# Patient Record
Sex: Female | Born: 1955
Health system: Southern US, Community
[De-identification: ages and names within clinical notes are randomized; demographics above are authoritative.]

## PROBLEM LIST (undated history)

## (undated) DIAGNOSIS — K589 Irritable bowel syndrome without diarrhea: Secondary | ICD-10-CM

## (undated) DIAGNOSIS — M1990 Unspecified osteoarthritis, unspecified site: Secondary | ICD-10-CM

## (undated) DIAGNOSIS — G4733 Obstructive sleep apnea (adult) (pediatric): Secondary | ICD-10-CM

## (undated) DIAGNOSIS — Z973 Presence of spectacles and contact lenses: Secondary | ICD-10-CM

## (undated) DIAGNOSIS — Z860100 Personal history of colon polyps, unspecified: Secondary | ICD-10-CM

## (undated) DIAGNOSIS — E119 Type 2 diabetes mellitus without complications: Secondary | ICD-10-CM

## (undated) DIAGNOSIS — R011 Cardiac murmur, unspecified: Secondary | ICD-10-CM

## (undated) DIAGNOSIS — C541 Malignant neoplasm of endometrium: Secondary | ICD-10-CM

## (undated) DIAGNOSIS — J45990 Exercise induced bronchospasm: Secondary | ICD-10-CM

## (undated) DIAGNOSIS — Z8679 Personal history of other diseases of the circulatory system: Secondary | ICD-10-CM

## (undated) HISTORY — DX: Irritable bowel syndrome, unspecified: K58.9

## (undated) HISTORY — PX: PLANTAR FASCIA RELEASE: SHX2239

## (undated) HISTORY — PX: OTHER SURGICAL HISTORY: SHX169

## (undated) HISTORY — DX: Personal history of colonic polyps: Z86.010

## (undated) HISTORY — DX: Personal history of colon polyps, unspecified: Z86.0100

---

## 1994-04-19 HISTORY — PX: LAPAROSCOPIC CHOLECYSTECTOMY: SUR755

## 1998-06-24 ENCOUNTER — Other Ambulatory Visit: Admission: RE | Admit: 1998-06-24 | Discharge: 1998-06-24 | Payer: Self-pay | Admitting: Internal Medicine

## 1998-11-24 ENCOUNTER — Other Ambulatory Visit: Admission: RE | Admit: 1998-11-24 | Discharge: 1998-11-24 | Payer: Self-pay | Admitting: Obstetrics & Gynecology

## 2000-04-06 ENCOUNTER — Other Ambulatory Visit: Admission: RE | Admit: 2000-04-06 | Discharge: 2000-04-06 | Payer: Self-pay | Admitting: Obstetrics & Gynecology

## 2000-11-14 ENCOUNTER — Encounter (INDEPENDENT_AMBULATORY_CARE_PROVIDER_SITE_OTHER): Payer: Self-pay | Admitting: Specialist

## 2000-11-14 ENCOUNTER — Other Ambulatory Visit: Admission: RE | Admit: 2000-11-14 | Discharge: 2000-11-14 | Payer: Self-pay | Admitting: Obstetrics & Gynecology

## 2002-05-01 ENCOUNTER — Emergency Department (HOSPITAL_COMMUNITY): Admission: EM | Admit: 2002-05-01 | Discharge: 2002-05-02 | Payer: Self-pay | Admitting: Emergency Medicine

## 2002-05-01 ENCOUNTER — Encounter: Payer: Self-pay | Admitting: Emergency Medicine

## 2002-05-25 ENCOUNTER — Other Ambulatory Visit: Admission: RE | Admit: 2002-05-25 | Discharge: 2002-05-25 | Payer: Self-pay | Admitting: Obstetrics & Gynecology

## 2002-08-06 ENCOUNTER — Emergency Department (HOSPITAL_COMMUNITY): Admission: EM | Admit: 2002-08-06 | Discharge: 2002-08-06 | Payer: Self-pay

## 2002-11-25 ENCOUNTER — Emergency Department (HOSPITAL_COMMUNITY): Admission: EM | Admit: 2002-11-25 | Discharge: 2002-11-25 | Payer: Self-pay | Admitting: Emergency Medicine

## 2003-04-29 ENCOUNTER — Other Ambulatory Visit: Admission: RE | Admit: 2003-04-29 | Discharge: 2003-04-29 | Payer: Self-pay | Admitting: Obstetrics & Gynecology

## 2004-02-19 ENCOUNTER — Ambulatory Visit: Payer: Self-pay | Admitting: Internal Medicine

## 2004-04-08 ENCOUNTER — Ambulatory Visit: Payer: Self-pay | Admitting: Internal Medicine

## 2004-06-17 ENCOUNTER — Other Ambulatory Visit: Admission: RE | Admit: 2004-06-17 | Discharge: 2004-06-17 | Payer: Self-pay | Admitting: Obstetrics & Gynecology

## 2004-10-14 ENCOUNTER — Inpatient Hospital Stay (HOSPITAL_COMMUNITY): Admission: RE | Admit: 2004-10-14 | Discharge: 2004-10-16 | Payer: Self-pay | Admitting: Psychiatry

## 2004-10-14 ENCOUNTER — Ambulatory Visit: Payer: Self-pay | Admitting: Psychiatry

## 2004-10-22 ENCOUNTER — Ambulatory Visit (HOSPITAL_COMMUNITY): Payer: Self-pay | Admitting: Psychiatry

## 2004-11-23 ENCOUNTER — Ambulatory Visit (HOSPITAL_COMMUNITY): Payer: Self-pay | Admitting: Psychiatry

## 2004-12-18 ENCOUNTER — Ambulatory Visit: Payer: Self-pay | Admitting: Internal Medicine

## 2005-01-06 ENCOUNTER — Ambulatory Visit (HOSPITAL_BASED_OUTPATIENT_CLINIC_OR_DEPARTMENT_OTHER): Admission: RE | Admit: 2005-01-06 | Discharge: 2005-01-06 | Payer: Self-pay | Admitting: Internal Medicine

## 2005-01-10 ENCOUNTER — Ambulatory Visit: Payer: Self-pay | Admitting: Internal Medicine

## 2005-01-21 ENCOUNTER — Ambulatory Visit: Payer: Self-pay | Admitting: Internal Medicine

## 2005-03-02 ENCOUNTER — Ambulatory Visit: Payer: Self-pay | Admitting: Internal Medicine

## 2005-08-19 ENCOUNTER — Ambulatory Visit: Payer: Self-pay | Admitting: Internal Medicine

## 2005-12-17 ENCOUNTER — Ambulatory Visit: Payer: Self-pay | Admitting: Internal Medicine

## 2007-07-21 ENCOUNTER — Telehealth (INDEPENDENT_AMBULATORY_CARE_PROVIDER_SITE_OTHER): Payer: Self-pay | Admitting: *Deleted

## 2007-07-21 ENCOUNTER — Emergency Department (HOSPITAL_COMMUNITY): Admission: EM | Admit: 2007-07-21 | Discharge: 2007-07-21 | Payer: Self-pay | Admitting: Emergency Medicine

## 2007-12-25 ENCOUNTER — Emergency Department (HOSPITAL_COMMUNITY): Admission: EM | Admit: 2007-12-25 | Discharge: 2007-12-25 | Payer: Self-pay | Admitting: Family Medicine

## 2007-12-29 ENCOUNTER — Encounter: Payer: Self-pay | Admitting: Internal Medicine

## 2008-01-31 ENCOUNTER — Telehealth (INDEPENDENT_AMBULATORY_CARE_PROVIDER_SITE_OTHER): Payer: Self-pay | Admitting: *Deleted

## 2008-02-01 ENCOUNTER — Ambulatory Visit: Payer: Self-pay | Admitting: Internal Medicine

## 2008-02-01 DIAGNOSIS — E669 Obesity, unspecified: Secondary | ICD-10-CM | POA: Insufficient documentation

## 2008-02-01 DIAGNOSIS — E1169 Type 2 diabetes mellitus with other specified complication: Secondary | ICD-10-CM | POA: Insufficient documentation

## 2008-02-01 DIAGNOSIS — E162 Hypoglycemia, unspecified: Secondary | ICD-10-CM | POA: Insufficient documentation

## 2008-02-01 DIAGNOSIS — R030 Elevated blood-pressure reading, without diagnosis of hypertension: Secondary | ICD-10-CM | POA: Insufficient documentation

## 2008-02-01 DIAGNOSIS — Z860100 Personal history of colon polyps, unspecified: Secondary | ICD-10-CM | POA: Insufficient documentation

## 2008-02-01 DIAGNOSIS — I10 Essential (primary) hypertension: Secondary | ICD-10-CM | POA: Insufficient documentation

## 2008-02-01 DIAGNOSIS — E118 Type 2 diabetes mellitus with unspecified complications: Secondary | ICD-10-CM | POA: Insufficient documentation

## 2008-02-01 DIAGNOSIS — I491 Atrial premature depolarization: Secondary | ICD-10-CM | POA: Insufficient documentation

## 2008-02-05 ENCOUNTER — Encounter (INDEPENDENT_AMBULATORY_CARE_PROVIDER_SITE_OTHER): Payer: Self-pay | Admitting: *Deleted

## 2008-02-05 LAB — CONVERTED CEMR LAB: Hgb A1c MFr Bld: 5.7 % (ref 4.6–6.0)

## 2008-03-31 ENCOUNTER — Emergency Department (HOSPITAL_COMMUNITY): Admission: EM | Admit: 2008-03-31 | Discharge: 2008-03-31 | Payer: Self-pay | Admitting: Emergency Medicine

## 2009-03-26 ENCOUNTER — Encounter: Payer: Self-pay | Admitting: Internal Medicine

## 2009-04-19 DIAGNOSIS — Z87898 Personal history of other specified conditions: Secondary | ICD-10-CM

## 2009-04-19 HISTORY — DX: Personal history of other specified conditions: Z87.898

## 2009-05-02 ENCOUNTER — Encounter: Payer: Self-pay | Admitting: Internal Medicine

## 2009-06-26 ENCOUNTER — Encounter: Payer: Self-pay | Admitting: Internal Medicine

## 2009-06-27 ENCOUNTER — Ambulatory Visit: Payer: Self-pay | Admitting: Internal Medicine

## 2009-06-27 DIAGNOSIS — Z9189 Other specified personal risk factors, not elsewhere classified: Secondary | ICD-10-CM | POA: Insufficient documentation

## 2009-06-27 DIAGNOSIS — R5381 Other malaise: Secondary | ICD-10-CM | POA: Insufficient documentation

## 2009-06-27 DIAGNOSIS — R0609 Other forms of dyspnea: Secondary | ICD-10-CM | POA: Insufficient documentation

## 2009-06-27 DIAGNOSIS — R350 Frequency of micturition: Secondary | ICD-10-CM | POA: Insufficient documentation

## 2009-06-27 DIAGNOSIS — O99810 Abnormal glucose complicating pregnancy: Secondary | ICD-10-CM | POA: Insufficient documentation

## 2009-06-27 DIAGNOSIS — R0989 Other specified symptoms and signs involving the circulatory and respiratory systems: Secondary | ICD-10-CM | POA: Insufficient documentation

## 2009-06-27 DIAGNOSIS — R5383 Other fatigue: Secondary | ICD-10-CM

## 2009-06-27 DIAGNOSIS — R631 Polydipsia: Secondary | ICD-10-CM | POA: Insufficient documentation

## 2009-06-30 ENCOUNTER — Ambulatory Visit: Payer: Self-pay | Admitting: Internal Medicine

## 2009-07-01 ENCOUNTER — Encounter: Payer: Self-pay | Admitting: Internal Medicine

## 2009-07-01 ENCOUNTER — Encounter (INDEPENDENT_AMBULATORY_CARE_PROVIDER_SITE_OTHER): Payer: Self-pay | Admitting: *Deleted

## 2009-07-01 LAB — CONVERTED CEMR LAB
Creatinine,U: 68.3 mg/dL
Hgb A1c MFr Bld: 5.7 % (ref 4.6–6.5)
Microalb Creat Ratio: 5.9 mg/g (ref 0.0–30.0)
Microalb, Ur: 0.4 mg/dL (ref 0.0–1.9)

## 2009-07-09 ENCOUNTER — Ambulatory Visit: Payer: Self-pay | Admitting: Internal Medicine

## 2009-07-10 ENCOUNTER — Encounter: Payer: Self-pay | Admitting: Internal Medicine

## 2010-02-09 ENCOUNTER — Ambulatory Visit: Payer: Self-pay | Admitting: Internal Medicine

## 2010-03-06 ENCOUNTER — Telehealth: Payer: Self-pay | Admitting: Internal Medicine

## 2010-03-06 ENCOUNTER — Encounter: Payer: Self-pay | Admitting: Cardiology

## 2010-03-06 ENCOUNTER — Emergency Department (HOSPITAL_COMMUNITY): Admission: EM | Admit: 2010-03-06 | Discharge: 2010-03-06 | Payer: Self-pay | Admitting: Emergency Medicine

## 2010-03-10 ENCOUNTER — Telehealth: Payer: Self-pay | Admitting: Internal Medicine

## 2010-03-30 ENCOUNTER — Ambulatory Visit: Payer: Self-pay | Admitting: Cardiology

## 2010-03-30 ENCOUNTER — Encounter: Payer: Self-pay | Admitting: Cardiology

## 2010-03-30 DIAGNOSIS — I498 Other specified cardiac arrhythmias: Secondary | ICD-10-CM | POA: Insufficient documentation

## 2010-03-30 DIAGNOSIS — I491 Atrial premature depolarization: Secondary | ICD-10-CM | POA: Insufficient documentation

## 2010-03-30 DIAGNOSIS — R011 Cardiac murmur, unspecified: Secondary | ICD-10-CM | POA: Insufficient documentation

## 2010-04-02 ENCOUNTER — Encounter: Payer: Self-pay | Admitting: Internal Medicine

## 2010-04-07 ENCOUNTER — Ambulatory Visit: Payer: Self-pay | Admitting: Cardiology

## 2010-04-07 ENCOUNTER — Encounter: Payer: Self-pay | Admitting: Cardiology

## 2010-04-10 ENCOUNTER — Telehealth: Payer: Self-pay | Admitting: Cardiology

## 2010-04-10 LAB — CONVERTED CEMR LAB
Alkaline Phosphatase: 68 units/L (ref 39–117)
Bilirubin, Direct: 0.1 mg/dL (ref 0.0–0.3)
LDL Cholesterol: 116 mg/dL — ABNORMAL HIGH (ref 0–99)
Total Bilirubin: 0.6 mg/dL (ref 0.3–1.2)
Total CHOL/HDL Ratio: 4
Total Protein: 5.3 g/dL — ABNORMAL LOW (ref 6.0–8.3)
VLDL: 28 mg/dL (ref 0.0–40.0)

## 2010-04-16 ENCOUNTER — Telehealth (INDEPENDENT_AMBULATORY_CARE_PROVIDER_SITE_OTHER): Payer: Self-pay | Admitting: *Deleted

## 2010-04-17 ENCOUNTER — Ambulatory Visit: Payer: Self-pay

## 2010-04-17 ENCOUNTER — Ambulatory Visit (HOSPITAL_COMMUNITY)
Admission: RE | Admit: 2010-04-17 | Discharge: 2010-04-17 | Payer: Self-pay | Source: Home / Self Care | Attending: Cardiology | Admitting: Cardiology

## 2010-04-17 ENCOUNTER — Encounter: Payer: Self-pay | Admitting: Cardiology

## 2010-04-17 HISTORY — PX: TRANSTHORACIC ECHOCARDIOGRAM: SHX275

## 2010-04-19 HISTORY — PX: DOBUTAMINE STRESS ECHO: SHX5426

## 2010-04-22 ENCOUNTER — Ambulatory Visit
Admission: RE | Admit: 2010-04-22 | Discharge: 2010-04-22 | Payer: Self-pay | Source: Home / Self Care | Attending: Cardiology | Admitting: Cardiology

## 2010-05-08 ENCOUNTER — Emergency Department (HOSPITAL_COMMUNITY)
Admission: EM | Admit: 2010-05-08 | Discharge: 2010-05-08 | Payer: Self-pay | Source: Home / Self Care | Admitting: Family Medicine

## 2010-05-19 NOTE — Progress Notes (Signed)
Summary: FYI  Phone Note Call from Patient   Caller: Patient Summary of Call: Pt came in for appt today at 12:00 p.m. I told pt that Dr. Alwyn Ren was behind. She then asked me how long so I called [facility] to see how far Dr. Alwyn Ren was behind and she told me 45 mins I told pt that he was about 45 mins behind. Pt responsed " Tell Dr. Alwyn Ren I am going to find another Doctor." She then turned around and walked out the door. Initial call taken by: Lavell Islam,  March 10, 2010 12:08 PM  Follow-up for Phone Call        noted

## 2010-05-19 NOTE — Progress Notes (Signed)
Summary: Low pulse/elevated BP  Phone Note Call from Patient   Summary of Call: Patient called stating that she went to donate plasma today and was refused due to her vitals. Pt states that she had a Pulse 38 and BP 186/60, and this is very unusual for her. Patient notes that she is worried, dizzy and light headed. I advised the patient that she should go to the ER for evalution. I also urged that she do not drive herself due to possibly of low pulse and decreased bloodflow which can cause symcope. Patient was also advised that if the blood does not flow properly it can result in tissue damage and other serious adverse events. I adived she go to the ER closest to her, which the patient stated was [facility]. Patient expressed understanding. Initial call taken by: Lucious Groves CMA,  March 06, 2010 1:46 PM

## 2010-05-19 NOTE — Letter (Signed)
Summary: Primary Care Consult Scheduled Letter  [facility] [place]  [address] [place], [place] [address]   Phone: [phone]  Fax: [phone]      07/01/2009 MRN: [id number]  LEIDI ASTLE [address] [place], [place]  [address]    Dear Ms. Balinski,    We have scheduled an appointment for you.  At the recommendation of Dr. Marga Melnick, we have scheduled you for Pulmonary Function Test with [facility] on 07-09-2009 at 12:00pm.  Their address is [address] floor, [place] [address]. The office phone number is [phone].  If this appointment day and time is not convenient for you, please feel free to call the office of the doctor you are being referred to at the number listed above and reschedule the appointment.    It is important for you to keep your scheduled appointments. We are here to make sure you are given good patient care.   Thank you,    Renee, Patient Care Coordinator [place] [facility]    *****SEE ATTACHED INSTRUCTIONS*****

## 2010-05-19 NOTE — Miscellaneous (Signed)
Summary: Orders Update  Clinical Lists Changes  Orders: Added new Referral order of Misc. Referral (Misc. Ref) - Signed 

## 2010-05-19 NOTE — Procedures (Signed)
Summary: Upper GI/[facility]  Upper GI/[facility]   Imported By: Lanelle Bal 05/23/2009 13:12:43  _____________________________________________________________________  External Attachment:    Type:   Image     Comment:   External Document

## 2010-05-19 NOTE — Procedures (Signed)
Summary: Colonoscopy/[facility]  Colonoscopy/[facility]   Imported By: Lanelle Bal 05/23/2009 13:13:39  _____________________________________________________________________  External Attachment:    Type:   Image     Comment:   External Document

## 2010-05-19 NOTE — Assessment & Plan Note (Signed)
Summary: tired ,sob off and on when she run/memory problems/kdc   Vital Signs:  Patient profile:   55 year old female Weight:      215.4 pounds O2 Sat:      96 % Temp:     98.7 degrees F oral Pulse rate:   55 / minute Resp:     16 per minute BP sitting:   115 / 78  (left arm) Cuff size:   large  Vitals Entered By: Shonna Chock (June 27, 2009 10:14 AM) CC: Tired, SOB and memory concerns x 2 weeks, Fatigue Comments REVIEWED MED LIST, PATIENT AGREED DOSE AND INSTRUCTION CORRECT    CC:  Tired, SOB and memory concerns x 2 weeks, and Fatigue.  History of Present Illness:  Fatigue      This is a 55 year old woman who presents with Fatigue for 18 months.  The patient reports persistent fatigue, fatigue with minimal exertion, and  now primarily motivational fatigue.  The patient also reports night sweats due to menopause  and dyspnea on exertion since 5K in 02/2009. No PMH of asthma. Smoked as teen. The patient denies fever, weight loss, exertional chest pain, cough, hemoptysis, and new medications.  Other symptoms include severe snoring; when supine this may awaken her.  The patient denies the following symptoms: leg swelling, orthopnea, PND, melena, adenopathy, daytime sleepiness, and skin changes.  She has  poor sleep due to nocturia & frequent & early  awakening. "Mind races " @ time. Increased stresses as single parent, work load  & financial issues related to divorce 2007.  The patient denies anhedonia, feeling depressed, and altered appetite.  She has been a plasma donor; but total protein has dropped. Labs from Dr Aldona Bar 06/26/2009:TP 6(low normal).  + Sleep Apnea 4 yrs ago; resolution with weight loss  Allergies: 1)  ! Pcn 2)  ! Amoxicillin 3)  ! Paxil  Past History:  Past Medical History: Gestational Diabetes ; IBS; Hyperglycemia, PMH of; Colonic polyps, hx ofX 3 Adverse reaction to Paxil 2004; Sleep Apnea, PMH of  Past Surgical History: G1P1 Cholecystectomy Colon polypectomy  X3( age [age], 35); FH colon polyps; Endo 2011: negative  Review of Systems General:  See HPI; Denies chills. Eyes:  Denies blurring, double vision, and vision loss-both eyes; Floaters . ENT:  Denies difficulty swallowing and hoarseness. CV:  [place]cca palpitations. GI:  Denies constipation and diarrhea; IBS. Derm:  Denies changes in nail beds and hair loss. Neuro:  Denies numbness and tingling. Psych:  Denies easily angered, easily tearful, and irritability. Endo:  Complains of cold intolerance, excessive thirst, and excessive urination; denies excessive hunger and heat intolerance.  Physical Exam  General:  well-nourished,in no acute distress; alert,appropriate and cooperative throughout examination Eyes:  No corneal or conjunctival inflammation noted.No lid lag.Perrla Mouth:  Oral mucosa and oropharynx without lesions or exudates.  Teeth in good repair. No pharyngeal erythema.   Neck:  No deformities, masses, or tenderness noted. Lungs:  Normal respiratory effort, chest expands symmetrically. Lungs are clear to auscultation, no crackles or wheezes. Heart:  normal rate, regular rhythm, no gallop, no rub, no JVD, no HJR, and grade 1/2-1 /6 systolic murmur.   Abdomen:  Bowel sounds positive,abdomen soft and non-tender without masses, organomegaly or hernias noted. Pulses:  R and L carotid,radial,dorsalis pedis and posterior tibial pulses are full and equal bilaterally Extremities:  No clubbing, cyanosis, edema, or deformity . Neurologic:  alert & oriented X3 and DTRs symmetrical and normal.  no tremor  Skin:  Intact without suspicious lesions or rashes Cervical Nodes:  No lymphadenopathy noted Axillary Nodes:  No palpable lymphadenopathy; ticklish Psych:  memory intact for recent and remote, normally interactive, good eye contact, not anxious appearing, and not depressed appearing.     Impression & Recommendations:  Problem # 1:  FATIGUE (ICD-780.79)  Orders: Venipuncture  ([address]) TLB-T4 (Thyrox), Free [phone]) TLB-T3, Free (Triiodothyronine) (84481-T3FREE)  Problem # 2:  DYSPNEA/SHORTNESS OF BREATH (ICD-786.09)  Orders: Venipuncture (19147) EKG w/ Interpretation (93000) T-2 View CXR (71020TC)  Problem # 3:  POLYDIPSIA (ICD-783.5)  Orders: TLB-A1C / Hgb A1C (Glycohemoglobin) (83036-A1C)  Problem # 4:  FREQUENCY, URINARY (ICD-788.41)  Orders: Venipuncture (82956) TLB-A1C / Hgb A1C (Glycohemoglobin) (83036-A1C) TLB-Microalbumin/Creat Ratio, Urine (82043-MALB)  Problem # 5:  GESTATIONAL DIABETES (ICD-648.80) PMH of Orders: Venipuncture (21308) TLB-A1C / Hgb A1C (Glycohemoglobin) (83036-A1C)  Problem # 6:  DIABETES MELLITUS, FAMILY HX (ICD-V18.0)  Orders: Venipuncture (65784) TLB-A1C / Hgb A1C (Glycohemoglobin) (83036-A1C)  Complete Medication List: 1)  Aleve 220 Mg Tabs (Naproxen sodium) .... 2 once daily 2)  Multivitamins Tabs (Multiple vitamin) .Marland Kitchen.. 1 by mouth once daily  Patient Instructions: 1)  PFTs & repeat Sleep Study if ordered studies negative.

## 2010-05-19 NOTE — Assessment & Plan Note (Signed)
Summary: COUGH FROM FRIDAY, SLIGHT FEVER///SPH   Vital Signs:  Patient profile:   55 year old female Height:      64.5 inches Weight:      221 pounds BMI:     37.48 Temp:     99.4 degrees F oral Pulse rate:   64 / minute BP sitting:   122 / 74  (left arm) Cuff size:   large  Vitals Entered By: Shonna Chock CMA (February 09, 2010 1:47 PM) CC: Cough and fever since Friday evening   CC:  Cough and fever since Friday evening.  History of Present Illness: Cough      This is a 55 year old woman who presents with Cough since 10/22.  The patient reports productive cough and fever of 99 over weekend, but denies shortness of breath and wheezing.  Associated symtpoms include sore throat . The patient denies the following symptoms: cold/URI symptoms.  The cough is worse with lying down.  Ineffective prior treatments have included OTC cough medication.   No smoking; no PMH of asthma.  Current Medications (verified): 1)  Aleve 220 Mg Tabs (Naproxen Sodium) .... 2 Once Daily 2)  Multivitamins  Tabs (Multiple Vitamin) .Marland Kitchen.. 1 By Mouth Once Daily  Allergies: 1)  ! Pcn 2)  ! Amoxicillin 3)  ! Paxil  Physical Exam  General:  well-nourished,in no acute distress; alert,appropriate and cooperative throughout examination Ears:  External ear exam shows no significant lesions or deformities.  Otoscopic examination reveals clear canals, tympanic membranes are intact bilaterally without bulging, retraction, inflammation or discharge. Hearing is grossly normal bilaterally. Nose:  External nasal examination shows no deformity or inflammation. Nasal mucosa are pink and moist without lesions or exudates. Mouth:  Oral mucosa and oropharynx without lesions or exudates.  Teeth in good repair. No pharyngeal erythema.   Lungs:  Normal respiratory effort, chest expands symmetrically. Lungs are clear to auscultation, no crackles or wheezes. Heart:  normal rate, regular rhythm, no gallop, no rub, no JVD, and grade 1  /6 systolic murmur.   Cervical Nodes:  No lymphadenopathy noted Axillary Nodes:  No palpable lymphadenopathy   Impression & Recommendations:  Problem # 1:  BRONCHITIS-ACUTE (ICD-466.0)  Her updated medication list for this problem includes:    Zithromax 250 Mg Tabs (Azithromycin) .Marland Kitchen... As per pack    Benzonatate 200 Mg Caps (Benzonatate) .Marland Kitchen... 1 pill every 6 hrs as needed  Complete Medication List: 1)  Aleve 220 Mg Tabs (Naproxen sodium) .... 2 once daily 2)  Multivitamins Tabs (Multiple vitamin) .Marland Kitchen.. 1 by mouth once daily 3)  Zithromax 250 Mg Tabs (Azithromycin) .... As per pack 4)  Benzonatate 200 Mg Caps (Benzonatate) .Marland Kitchen.. 1 pill every 6 hrs as needed  Patient Instructions: 1)  Drink as much fluid as you can tolerate for the next few days. Prescriptions: BENZONATATE 200 MG CAPS (BENZONATATE) 1 pill every 6 hrs as needed  #21 x 0   Entered and Authorized by:   Marga Melnick MD   Signed by:   Marga Melnick MD on 02/09/2010   Method used:   Print then Give to Patient   RxID:   [id number] ZITHROMAX 250 MG TABS (AZITHROMYCIN) as per pack  #1 x 0   Entered and Authorized by:   Marga Melnick MD   Signed by:   Marga Melnick MD on 02/09/2010   Method used:   Print then Give to Patient   RxID:   [id number]    Orders Added: 1)  Est. Patient Level III [id number]

## 2010-05-19 NOTE — Miscellaneous (Signed)
Summary: Orders Update pft charges  Clinical Lists Changes  Orders: Added new Service order of Carbon Monoxide diffusing w/capacity (94720) - Signed Added new Service order of Lung Volumes (94240) - Signed Added new Service order of Spirometry (Pre & Post) (94060) - Signed 

## 2010-05-21 NOTE — Miscellaneous (Signed)
Summary: Appointment Canceled  Appointment status changed to canceled by [employer] on 04/02/2010 3:17 PM.  Cancellation Comments --------------------- ECHO/786.09/[employer]BS PREC. REQ/STRESS ECHO @ 4P/SAF  Appointment Information ----------------------- Appt Type:  CARDIOLOGY ANCILLARY VISIT      Date:  Wednesday, April 15, 2010      Time:  3:00 PM for 60 min   Urgency:  Routine   Made By:  Hoy Finlay Scheduler  To Visit:  [id number]    Reason:  ECHO/786.09/BCBS PREC. REQ/STRESS ECHO @ 4P/SAF  Appt Comments ------------- -- 04/02/10 15:17: (CEMR) CANCELED -- ECHO/786.09/BCBS PREC. REQ/STRESS ECHO @ 4P/SAF -- 03/30/10 11:36: (CEMR) BOOKED -- Routine CARDIOLOGY ANCILLARY VISIT at 04/15/2010 3:00 PM for 60 min ECHO/786.09/BCBS PREC. REQ/STRESS ECHO @ 4P/SAF

## 2010-05-21 NOTE — Progress Notes (Signed)
Summary: Stress echo instructions  Phone Note Outgoing Call Call back at [facility] Phone [phone]   Call placed by: Stanton Kidney, EMT-P,  April 16, 2010 9:38 AM Summary of Call: Left message ref: stress echo instructions. Stanton Kidney, EMT-P  April 16, 2010 9:38 AM

## 2010-05-21 NOTE — Assessment & Plan Note (Signed)
Summary: nph   Visit Type:  NP Primary Provider:  Dr. Alwyn Ren  CC:  shortness of breath, CP, and headaches.  History of Present Illness: 55 yo presents for evaluation of bradycardia and exertional dyspnea.  The main precipitating event for this visit was an episode of bradycardia in 11/11.  Patient went to give blood (gives regularly) and was noted in pre-draw vitals to have HR of 38 and BP 186/60.  She says that she was feeling mildly lightheaded and "weird" that day, so she was sent to the ER.  In the ER, HR appears to have been in the 60s-70s.  Lab workup was normal and she was sent home.  She says that she occasionally gets lightheaded with standing after sitting for long periods at work (works at [employer] and [employer]).  Otherwise, no significant dizziness.  She has never passed out.  She has had some difficult to characterize dyspnea over the last year or so, but she is able to play doubles tennis twice a week and walk for 40 minutes during her lunch break most days with dyspnea or chest pain.  She ran in a 5K in 11/11, finishing in about 40 minutes.  Of note, she thinks she has gained about 70 lbs in 1-2 years.  She has had no recent chest pain.  She was admitted to [facility] 3 years ago with atypical chest pain and had a workup at that time.  She says she had a stress test and thinks it was normal.  ECG: NSR, lateral Qs  Labs (3/11): A1c 5.7%, free T3 and free T4 normal Labs (11/11): TSH normal, cardiac enzymes negative, creatinine 0.08, HCT 43  Current Medications (verified): 1)  Aleve 220 Mg Tabs (Naproxen Sodium) .... 2 Once Daily 2)  Multivitamins  Tabs (Multiple Vitamin) .Marland Kitchen.. 1 By Mouth Once Daily 3)  Viteyes Omega-3 200-300-5 Mg-Mg-Unit Caps (Dha-Epa-Vitamin E) .... 2 Once Daily 4)  Naproxen Sodium 220 Mg Tabs (Naproxen Sodium) .... 2 Tablets Once Daily 5)  Ccp Caffeine Free 325-200-10 Mg Tabs (Phenylephrine-Apap-Guaifenesin) .... 2 Tablets Once Daily 6)  Alka-Seltzer Plus Cold &  Cough 08-18-08-325 Mg Caps (Phenyleph-Cpm-Dm-Apap) .... Once Daily  Allergies (verified): 1)  ! Pcn 2)  ! Amoxicillin 3)  ! Paxil  Past History:  Past Medical History: 1. Gestational Diabetes 2. IBS 3. Colonic polyps, hx ofX 3 4. Adverse reaction to Paxil 2004 5. Sleep Apnea: Lost weight with resolution of symptoms but has gained weight back.  6. Bradycardia: HR 38 on one occasion when she went to give blood 7. h/o cholecystectomy  Family History: Father:AODM, polyps,COPD  Mother: MI at 65,COPD,lung CA,PVD Siblings: 2 bro HTN  Social History: Occupation:Staff Artist at [employer] Alcohol use-yes Regular exercise-yes Divorced; 1 son at home with her No tobacco use No drug use  Review of Systems       All systems reviewed and negative except as per HPI.   Vital Signs:  Patient profile:   55 year old female Height:      64.5 inches Weight:      226.50 pounds BMI:     38.42 Pulse rate:   66 / minute BP sitting:   137 / 82  (left arm) Cuff size:   regular  Vitals Entered By: Caralee Ates CMA (March 30, 2010 10:37 AM)  Physical Exam  General:  Well developed, well nourished, in no acute distress.   Obese.  Head:  normocephalic and atraumatic Nose:  no deformity, discharge,  inflammation, or lesions Mouth:  Teeth, gums and palate normal. Oral mucosa normal. Neck:  Neck thick, no JVD. No masses, thyromegaly or abnormal cervical nodes. Lungs:  Clear bilaterally to auscultation and percussion. Heart:  Non-displaced PMI, chest non-tender; regular rate and rhythm, S1, S2 without rubs or gallops. 2/6 early systolic murmur RUSB.  Carotid upstroke normal, no bruit.  Pedals normal pulses. No edema, no varicosities. Abdomen:  Bowel sounds positive; abdomen soft and non-tender without masses, organomegaly, or hernias noted. No hepatosplenomegaly. Extremities:  No clubbing or cyanosis. Neurologic:  Alert and oriented x 3. Skin:  Intact without lesions or rashes. Psych:   Normal affect.   Impression & Recommendations:  Problem # 1:  BRADYCARDIA (ICD-427.89) 1 recorded episode of low heart rate (38 when she went to get blood drawn).  In the ER and here today, HR has been in the 60s and 70s.  I wonder if the reading could have been an error.  However, given her history of fatigue, I will have her get a 48 hour holter monitor to assess for any pauses or significant bradycardia.  She also has gained back a lot of weight in the last year or so.  She does not have daytime sleepiness to the extent that she did prior to past sleep study, but if she has significant bradycardia, it would probably be a good idea to repeat a sleep study as nocturnal desaturation can lead to bradyarrhythmias.   Problem # 2:  ABNORMAL ELECTROCARDIOGRAM (ICD-794.31) Patient has lateral Qs on ECG.  She feels fatigued all the time.  She seems to have fairly good exercise tolerance, but does report occasional shortness of breath.  Mainly due to the abnormal ECG, I think that a stress echo would be a good idea. I will arrange for this.   Problem # 3:  DYSPNEA/SHORTNESS OF BREATH (ICD-786.09) I will get a stress echo, as above, to assess for ischemia and also to look for pulmonary hypertension and any valvular abnormalities.  She does have an aortic outflow-type murmur.  I will get a BNP.   She is due for fasting lipids, which we will arrange.   Other Orders: Stress Echo (Stress Echo) Echocardiogram (Echo) Holter Monitor (Holter Monitor)  Patient Instructions: 1)  Your physician has requested that you have an echocardiogram.  Echocardiography is a painless test that uses sound waves to create images of your heart. It provides your doctor with information about the size and shape of your heart and how well your heart's chambers and valves are working.  This procedure takes approximately one hour. There are no restrictions for this procedure. 2)  Your physician has requested that you have a stress  echocardiogram. For further information please visit [email or web address]  Please follow instruction sheet as given. 3)  Your physician has recommended that you wear a holter monitor.  Holter monitors are medical devices that record the heart's electrical activity. Doctors most often use these monitors to diagnose arrhythmias. Arrhythmias are problems with the speed or rhythm of the heartbeat. The monitor is a small, portable device. You can wear one while you do your normal daily activities. This is usually used to diagnose what is causing palpitations/syncope (passing out). 48 HOUR MONITOR 4)  Your physician recommends that you return for fasting  lab--Lipid profile/Liver profile/BNP 786.09 472.89 5)  Your physician recommends that you schedule a follow-up appointment in: 3-4 weeks with Dr Shirlee Latch after testing has been completed.   Appended Document: nph See Internal communication ;  I believe she has "fired" me.

## 2010-05-21 NOTE — Progress Notes (Signed)
Summary: pt returning call  Phone Note Call from Patient Call back at [phone]   Caller: Patient Summary of Call: Pt returning call Initial call taken by: Judie Grieve,  April 10, 2010 8:05 AM  Follow-up for Phone Call        No answer x 2.  Voicemail is not set up. Mylo Red RN     Appended Document: pt returning call could someone try to call her again to see what she needed? thanks.   Appended Document: pt returning call LVMTCB x 2.

## 2010-05-21 NOTE — Assessment & Plan Note (Signed)
Summary: 3:45PM/PER CHECKO UT/SF   Visit Type:  Follow-up Primary Provider:  Dr. Alwyn Ren  CC:  some SOB and a little dizziness.  History of Present Illness: 55 yo presented initially for evaluation of bradycardia and exertional dyspnea.  The main precipitating event for referral was an episode of bradycardia in 11/11.  Patient went to give blood (gives regularly) and was noted in pre-draw vitals to have HR of 38 and BP 186/60.  She says that she was feeling mildly lightheaded and "weird" that day, so she was sent to the ER.  In the ER, HR appears to have been in the 60s-70s.  Lab workup was normal and she was sent home.  She says that she occasionally gets lightheaded with standing after sitting for long periods at work (works at [employer]).  Otherwise, no significant dizziness.  She has never passed out.  She has had some difficult to characterize dyspnea over the last year or so, but she is able to play doubles tennis twice a week and walk for 40 minutes during her lunch break most days with dyspnea or chest pain.  She ran in a 5K in 11/11, finishing in about 40 minutes.  Of note, she thinks she has gained about 70 lbs in 1-2 years.  She has had no recent chest pain.  She gets occasional mild palpitations.  I had her do a stress echo.  This showed EF 55%, normal RV, normal valves, and normal LV diastolic function at baseline.  Stress portion of the test showed good exercise tolerance and no evidence for ischemia or infarction.  There was a hypertensive BP response to stress.  Holter monitor showed occasional HR in the 40s (likely this was mostly during sleep).  There were also runs of 5-10 beats SVT (atrial tachycardia versus AVNRT).   Labs (3/11): A1c 5.7%, free T3 and free T4 normal Labs (11/11): TSH normal, cardiac enzymes negative, creatinine 0.08, HCT 43 Labs (12/11): LDL 116, HDL 51, LFTs normal, BNP 27  Current Medications (verified): 1)  Aleve 220 Mg Tabs (Naproxen Sodium) .... 2  Once Daily 2)  Multivitamins  Tabs (Multiple Vitamin) .Marland Kitchen.. 1 By Mouth Once Daily 3)  Viteyes Omega-3 200-300-5 Mg-Mg-Unit Caps (Dha-Epa-Vitamin E) .... 2 Once Daily 4)  Naproxen Sodium 220 Mg Tabs (Naproxen Sodium) .... 2 Tablets Once Daily  Allergies (verified): 1)  ! Pcn 2)  ! Amoxicillin 3)  ! Paxil  Past History:  Past Medical History: 1. Gestational Diabetes 2. IBS 3. Colonic polyps, hx ofX 3 4. Adverse reaction to Paxil 2004 5. Sleep Apnea: Lost weight with resolution of symptoms but has gained weight back.  6. Arrhythmias: HR 38 on one occasion when she went to give blood.  Holter monitor (12/11) showed HR range 40s-137, average 68, PACs, several 5-10 beat runs of atrial tachycardia versus AVNRT.  7. h/o cholecystectomy 8. Dyspnea: Echo (12/11) with EF 55%, normal diastolic function, normal RV, normal PA pressures, normal valves.  Stress echo showed hypertensive BP response with no evidence for ischemia or infarction by echo or ECG.    Family History: Reviewed history from 03/30/2010 and no changes required. Father:AODM, polyps,COPD  Mother: MI at 65,COPD,lung CA,PVD Siblings: 2 bro HTN  Social History: Reviewed history from 03/30/2010 and no changes required. Occupation:Staff Artist at [employer] Alcohol use-yes Regular exercise-yes Divorced; 1 son at home with her No tobacco use No drug use  Review of Systems       All systems  reviewed and negative except as per HPI.   Vital Signs:  Patient profile:   55 year old female Height:      64.5 inches Weight:      229.75 pounds BMI:     38.97 Pulse rate:   68 / minute BP sitting:   134 / 90  (left arm) Cuff size:   regular  Vitals Entered By: Caralee Ates CMA (April 22, 2010 3:41 PM)  Physical Exam  General:  Well developed, well nourished, in no acute distress.   Obese.  Neck:  Neck thick, no JVD. No masses, thyromegaly or abnormal cervical nodes. Lungs:  Clear bilaterally to auscultation and  percussion. Heart:  Non-displaced PMI, chest non-tender; regular rate and rhythm, S1, S2 without rubs or gallops. 1/6 early systolic murmur RUSB.  Carotid upstroke normal, no bruit.  Pedals normal pulses. No edema, no varicosities. Abdomen:  Bowel sounds positive; abdomen soft and non-tender without masses, organomegaly, or hernias noted. No hepatosplenomegaly. Extremities:  No clubbing or cyanosis. Neurologic:  Alert and oriented x 3. Psych:  Normal affect.   Impression & Recommendations:  Problem # 1:  BRADYCARDIA (ICD-427.89) Mild bradycardia into the 40s seems to occur mainly while asleep.  She is chronotropically competent with maximum HR 130s on holter.  Continue to follow.  She may have residual OSA, as this was diagnosed in the past.  The could contribute to bradycardia as well as other dysrhythmias.  She does not want to repeat a sleep study, so I encouraged her to work hard with diet and exercise to lose weight.   Problem # 2:  SVT Patient had short runs of 5-10 beats SVT on holter (AVNRT versus atrial tachycardia).  She has occasional palpitations that are not particularly worrisome.  She has completely cut out caffeine.  She should avoid OTC cold meds.  No treatment at this time as no long runs of SVT have occurred and she is relatively asymptomatic.  If she needs a beta blocker in the future, pindolol may be an option with its intrinsic sympathomimetic properties (HR may not get as low with this).    Problem # 3:  DYSPNEA/SHORTNESS OF BREATH (ICD-786.09) I suspect that obesity and deconditioning are playing the main role here.  Echo and stress echo were normal.  Needs diet/exercise.   Problem # 4:  HYPERTENSION Hypertensive BP response on the treadmill.  BP is borderline today. I will have her check her BP daily at home and we will call for readings in 2 wks.    Patient Instructions: 1)  Take and record your blood pressure about 3 times a week--call and give me the readings   Luana Shu  [phone]  2)  Your physician wants you to follow-up in: 1 year with Dr Shirlee Latch. (January 2013) You will receive a reminder letter in the mail two months in advance. If you don't receive a letter, please call our office to schedule the follow-up appointment.

## 2010-05-21 NOTE — Procedures (Signed)
Summary: summary report  summary report   Imported By: Mirna Mires 04/23/2010 12:19:20  _____________________________________________________________________  External Attachment:    Type:   Image     Comment:   External Document

## 2010-05-21 NOTE — Miscellaneous (Signed)
Summary: Appointment Canceled  Appointment status changed to canceled by [employer] on 04/02/2010 3:17 PM.  Cancellation Comments --------------------- STRESS ECHO/785.2/[employer] PREC. REQ/ECHO @ 3PM/SAF  Appointment Information ----------------------- Appt Type:  CARDIOLOGY ANCILLARY VISIT      Date:  Wednesday, April 15, 2010      Time:  4:00 PM for 60 min   Urgency:  Routine   Made By:  Pearson Grippe  To Visit:  [id number]    Reason:  STRESS ECHO/785.2/[employer] PREC. REQ/ECHO @ 3PM/SAF  Appt Comments ------------- -- 04/02/10 15:17: (CEM[facility]) CANCELED -- STRESS ECHO/785.2/[employer] PREC. REQ/ECHO @ 3PM/SAF -- 03/30/10 11:37: (CEMR) BOOKED -- Routine CARDIOLOGY ANCILLARY VISIT at 04/15/2010 4:00 PM for 60 min STRESS ECHO/785.2/[employer] PREC. REQ/ECHO @ 3PM/SAF

## 2010-06-30 LAB — CBC
HCT: 43.3 % (ref 36.0–46.0)
Hemoglobin: 15 g/dL (ref 12.0–15.0)
MCH: 29.2 pg (ref 26.0–34.0)
MCHC: 34.6 g/dL (ref 30.0–36.0)
MCV: 84.2 fL (ref 78.0–100.0)
Platelets: 252 10*3/uL (ref 150–400)
RBC: 5.14 MIL/uL — ABNORMAL HIGH (ref 3.87–5.11)
RDW: 13 % (ref 11.5–15.5)
WBC: 6.5 10*3/uL (ref 4.0–10.5)

## 2010-06-30 LAB — DIFFERENTIAL
Basophils Absolute: 0 10*3/uL (ref 0.0–0.1)
Basophils Relative: 1 % (ref 0–1)
Eosinophils Absolute: 0.3 K/uL (ref 0.0–0.7)
Eosinophils Relative: 4 % (ref 0–5)
Lymphocytes Relative: 30 % (ref 12–46)
Lymphs Abs: 2 K/uL (ref 0.7–4.0)
Monocytes Absolute: 0.4 K/uL (ref 0.1–1.0)
Monocytes Relative: 6 % (ref 3–12)
Neutro Abs: 3.9 10*3/uL (ref 1.7–7.7)
Neutrophils Relative %: 59 % (ref 43–77)

## 2010-06-30 LAB — COMPREHENSIVE METABOLIC PANEL
Alkaline Phosphatase: 74 U/L (ref 39–117)
BUN: 20 mg/dL (ref 6–23)
CO2: 26 mEq/L (ref 19–32)
Chloride: 107 mEq/L (ref 96–112)
Creatinine, Ser: 1.08 mg/dL (ref 0.4–1.2)
GFR calc non Af Amer: 53 mL/min — ABNORMAL LOW (ref >60)
Glucose, Bld: 100 mg/dL — ABNORMAL HIGH (ref 70–99)
Potassium: 4 mEq/L (ref 3.5–5.1)
Total Bilirubin: 0.4 mg/dL (ref 0.3–1.2)

## 2010-06-30 LAB — COMPREHENSIVE METABOLIC PANEL WITH GFR
ALT: 38 U/L — ABNORMAL HIGH (ref 0–35)
AST: 34 U/L (ref 0–37)
Albumin: 3.3 g/dL — ABNORMAL LOW (ref 3.5–5.2)
Calcium: 8.7 mg/dL (ref 8.4–10.5)
GFR calc Af Amer: >60 mL/min (ref >60)
Sodium: 139 meq/L (ref 135–145)
Total Protein: 5.8 g/dL — ABNORMAL LOW (ref 6.0–8.3)

## 2010-06-30 LAB — POCT CARDIAC MARKERS
CKMB, poc: <1 ng/mL — ABNORMAL LOW (ref 1.0–8.0)
Myoglobin, poc: 59.1 ng/mL (ref 12–200)
Troponin i, poc: <0.05 ng/mL (ref 0.00–0.09)

## 2010-06-30 LAB — TSH: TSH: 1.512 u[IU]/mL (ref 0.350–4.500)

## 2010-09-04 NOTE — Procedures (Signed)
NAME:  Margaret [name], Margaret [name]             ACCOUNT NO.:  [id number]   MEDICAL RECORD NO.:  [id number]          PATIENT TYPE:  OUT   LOCATION:  SLEEP CENTER                 FACILITY:  [facility]   PHYSICIAN:  Clinton D. Maple Hudson, M.D. DATE OF BIRTH:  Jul 19, 1955   DATE OF STUDY:  01/06/2005                              NOCTURNAL POLYSOMNOGRAM   REFERRING PHYSICIAN:  Dr. Jetty Duhamel.   DATE OF STUDY:  January 06, 2005.   INDICATION FOR STUDY:  Hypersomnia with sleep apnea. Epworth sleepiness  score 16/24, BMI 39. Weight 230 pounds.   SLEEP ARCHITECTURE:  Total sleep time 315 minutes with sleep efficiency 80%.  Stage I 7%, stage II 44%, stages III and IV 24%, REM 26% of total sleep  time. Sleep latency 23 minutes, REM latency 65 minutes, awake after sleep  onset 64 minutes, arousal index increased 47. No bedtime medication taken.   RESPIRATORY DATA:  Split study protocol. Apnea/hypopnea index (AHI, RDI)  23.3 obstructive events per hour indicating moderate obstructive sleep  apnea/hypopnea syndrome. There was 17 obstructive apneas, 2 central apneas,  1 mixed apnea, and 55 hypopneas before CPAP. Most sleep and all events were  reported while supine. REM RDI of 29. CPAP was titrated to 12 CWP, AHI 0 per  hour. A small Respironics ComfortGel nasal mask was used with heated  humidifier.   OXYGEN DATA:  Moderate to loud snoring with oxygen desaturation to a nadir  of 85%. After CPAP control saturation held 95-98% on room air.   CARDIAC DATA:  Sinus rhythm with occasional PAC.   MOVEMENT/PARASOMNIA:  Frequent leg jerks but with little effect on sleep.   IMPRESSION/RECOMMENDATIONS:  1.  Moderate obstructive sleep apnea/hypopnea syndrome, apnea/hypopnea index      23.3 per hour with moderate to loud snoring and oxygen desaturation to      85%. She slept mainly supine and a positional component cannot be      excluded.  2.  Successful CPAP titration to 12 centimeter of water pressure,  apnea/hypopnea index 0 per hour using a small Respironics ComfortGel      nasal mask with heated humidifier.      Clinton D. Maple Hudson, M.D.  Diplomate, Biomedical engineer of Sleep Medicine  Electronically Signed     CDY/MEDQ  D:  01/10/2005 14:28:26  T:  01/10/2005 23:56:19  Job:  [id number]

## 2010-09-04 NOTE — Discharge Summary (Signed)
[name], [name] NO.:  [id number]   MEDICAL RECORD NO.:  [id number]          PATIENT TYPE:  IPS   LOCATION:  0302                          FACILITY:  [facility]   PHYSICIAN:  Jeanice Lim, M.D. DATE OF BIRTH:  26-Mar-1956   DATE OF ADMISSION:  10/14/2004  DATE OF DISCHARGE:  10/16/2004                                 DISCHARGE SUMMARY   IDENTIFYING INFORMATION:  This 55 year old married Caucasian female is  voluntarily admitted.  She presented with a history of depression and  suicidal ideation.  She had not slept well for 5 months after being on  Cymbalta and nauseated.  She gained 25 pounds, stopped medications 4 days.  Positive racing thoughts, drinking wine for sleep.   MEDICATIONS:  1.  Cymbalta, weaning off for 4 days.  Last dose 4 days ago.  2.  In the past Paxil.  Wellbutrin caused her to be sluggish.   ALLERGIES:  PENICILLIN.   PHYSICAL EXAMINATION:  Physical and neurologic exam essentially within  normal limits.   MENTAL STATUS EXAM:  Fully alert, cooperative, good eye contact.  Mood  nauseated, tired, dysphoric.  Thought processes goal directed, no evidence  of psychosis.  Cognition is intact.  Judgment and insight are fair.   ADMISSION DIAGNOSES:  AXIS I:  Depressive disorder, not otherwise specified.  Rule out bipolar II disorder.  AXIS II:  Deferred.  AXIS III:  History of hypoglycemia.  AXIS IV:  Moderate. Stressors economic and other psychosocial issues.  AXIS V:  30/55.   HOSPITAL COURSE:  Patient was admitted, ordered routine p.r.n. medications,  underwent further monitoring, was resumed on medications including Risperdal  for mood instability and Ativan p.r.n.  Followup and aftercare planning.  While admitted __________ support system are initiated.  Patient reported  positive response to clinical intervention.  Patient was given medication  education and discharged with no acute withdrawal symptom.  Mood was stable,  no signs of  distress or side effects from medications aware of.  Risk/benefit ration and alternatives to treatment regarding medications.   DISCHARGE MEDICATIONS:  1.  Lamictal 25 mg daily.  2.  Risperdal 0.5 gm at night p.r.n.  3.  Ambien 10 mg q.h.s. p.r.n.   FOLLOW UP:  The patient is discharged to follow up with Dr. Electa Sniff October 22, 2004 at 2:00 p.m. and Vertell Novak.   DISCHARGE DIAGNOSES:  AXIS I:  Depressive disorder, not otherwise specified.  Rule out bipolar II disorder.  AXIS II:  Deferred.  AXIS III:  History of hypoglycemia.  AXIS IV:  Moderate. Stressors economic and other psychosocial issues.  AXIS V:  Global assessment of function on discharge 55.   Patient was to scheduled followup after returning from vacation through October 25, 2004.  Condition on discharge was markedly improved.       JEM/MEDQ  D:  11/24/2004  T:  11/25/2004  Job:  [id number]

## 2010-09-04 NOTE — Assessment & Plan Note (Signed)
[facility]                               PULMONARY OFFICE NOTE   Margaret [name], Margaret [name]                    MRN:          [id number]  DATE:12/17/2005                            DOB:          03-31-1956    PULMONARY SLEEP FOLLOW-UP:   PROBLEM:  Obstructive sleep apnea.   HISTORY:  Since last seen in May she has lost more weight.  She no longer  wakes herself snoring.  Is pleased with the amount of energy she has in the  day.  Feels very well and has not used the CPAP.  She is alone except for  her child and not told of snoring.  We discussed this.   MEDICATIONS:  1. [place].  2. Advil.   Drug-intolerant to PENICILLIN with hives.   OBJECTIVE:  VITAL SIGNS:  Weight is down from 209 pounds in May to 174  pounds today.  BP 122/78, pulse regular at 69, room air saturation 98%.  GENERAL:  She is alert, pleasant, and appropriate.  She is still a little  heavier than ideal body weight but enormously improved and fairly happy  about her circumstance.   IMPRESSION:  Obstructive sleep apnea responding appropriately to weight  loss.  She no longer recognizes symptoms suggestive of a significant sleep  apnea and we agreed that it was appropriate to turn in the CPAP now.   PLAN:  1. Discontinue CPAP.  2. Return p.r.n. as discussed.                                   Clinton D. Maple Hudson, MD, [facility], FACP   CDY/MedQ  DD:  12/17/2005  DT:  12/17/2005  Job #:  [id number]   cc:   Dr. Mathews [facility]  Titus Dubin. Alwyn Ren, MD, FACP, [facility]  Gerrit Friends. Aldona Bar, MD

## 2010-10-19 ENCOUNTER — Other Ambulatory Visit: Payer: Self-pay | Admitting: Obstetrics & Gynecology

## 2010-12-14 ENCOUNTER — Encounter: Payer: Self-pay | Admitting: Internal Medicine

## 2011-01-21 LAB — DIFFERENTIAL
Eosinophils Absolute: 0.1 10*3/uL (ref 0.0–0.7)
Eosinophils Relative: 2 % (ref 0–5)
Lymphs Abs: 1.1 10*3/uL (ref 0.7–4.0)
Monocytes Absolute: 0.3 10*3/uL (ref 0.1–1.0)
Monocytes Relative: 5 % (ref 3–12)

## 2011-01-21 LAB — COMPREHENSIVE METABOLIC PANEL
ALT: 20 U/L (ref 0–35)
AST: 29 U/L (ref 0–37)
Albumin: 3.8 g/dL (ref 3.5–5.2)
CO2: 28 mEq/L (ref 19–32)
Calcium: 9.3 mg/dL (ref 8.4–10.5)
GFR calc Af Amer: >60 mL/min (ref >60)
Sodium: 142 mEq/L (ref 135–145)
Total Protein: 6.2 g/dL (ref 6.0–8.3)

## 2011-01-21 LAB — URINE MICROSCOPIC-ADD ON

## 2011-01-21 LAB — CBC
MCHC: 33.4 g/dL (ref 30.0–36.0)
RBC: 4.93 MIL/uL (ref 3.87–5.11)
RDW: 12.8 % (ref 11.5–15.5)

## 2011-01-21 LAB — URINALYSIS, ROUTINE W REFLEX MICROSCOPIC
Bilirubin Urine: NEGATIVE
Hgb urine dipstick: NEGATIVE
Nitrite: NEGATIVE
Specific Gravity, Urine: 1.019 (ref 1.005–1.030)
pH: 7.5 (ref 5.0–8.0)

## 2011-04-23 ENCOUNTER — Encounter: Payer: Self-pay | Admitting: *Deleted

## 2011-04-26 ENCOUNTER — Encounter: Payer: Self-pay | Admitting: Cardiology

## 2011-04-26 ENCOUNTER — Ambulatory Visit (INDEPENDENT_AMBULATORY_CARE_PROVIDER_SITE_OTHER): Payer: BC Managed Care – PPO | Admitting: Cardiology

## 2011-04-26 VITALS — BP 118/74 | HR 69 | Ht 64.0 in | Wt 237.0 lb

## 2011-04-26 DIAGNOSIS — E669 Obesity, unspecified: Secondary | ICD-10-CM

## 2011-04-26 DIAGNOSIS — I491 Atrial premature depolarization: Secondary | ICD-10-CM

## 2011-04-26 DIAGNOSIS — R002 Palpitations: Secondary | ICD-10-CM

## 2011-04-26 NOTE — Progress Notes (Signed)
PCP: Dr. Alwyn Ren  56 yo with history of palpitations and exertional dyspnea returns for cardiology followup.  She had a stress echo in 1/12 that was normal and a holter with PACs.  She has been doing well in general since Iast saw her.  She still works at [employer] which has been fairly high stress for her.  She has continued to gain weight: she is up 11 lbs since last appointment.  No chest pain.  Rare palpitations.  She occasionally feels "dizzy" but no syncope.  She has stable mild dyspnea with heavy exertion.  She is not regular with exercise but does jog occasionally and plays tennis when she can.   ECG: NSR, PAD  Labs (3/11): A1c 5.7%, free T3 and free T4 normal  Labs (11/11): TSH normal, cardiac enzymes negative, creatinine 0.08, HCT 43   Allergies (verified):  1) ! Pcn  2) ! Amoxicillin  3) ! Paxil   Past Medical History:  1. Gestational Diabetes  2. IBS  3. Colonic polyps, hx ofX 3  4. Adverse reaction to Paxil 2004  5. Sleep Apnea: Lost weight with resolution of symptoms but has gained weight back.  6. Bradycardia: HR 38 on one occasion when she went to give blood.  7. h/o cholecystectomy  8. Holter monitor 12/11 with PACs 9. Stress echo 1/12: normal with no evidence for ischemia or infarction 10.  Echo (12/11): EF 55%, normal RV size and systolic function, normal diastolic function.   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  Lungs: Clear to auscultation bilaterally with normal respiratory effort. CV: Nondisplaced PMI.  Heart regular S1/S2, no S3/S4, no  murmur.  No peripheral edema.  No carotid bruit.  Normal pedal pulses.  Abdomen: Soft, nontender, no hepatosplenomegaly, no distention.  Neurologic: Alert and oriented x 3.  Psych: Normal affect. Extremities: No clubbing or cyanosis.

## 2011-04-26 NOTE — Patient Instructions (Signed)
Your physician recommends that you return for a FASTING lipid profile /liver profile/BMET/CBC/TSH 785.1   You do not need to  schedule a follow-up appointment with Dr Shirlee Latch.

## 2011-04-27 NOTE — Assessment & Plan Note (Signed)
Major issue for her at this point is obesity.  She needs to lose weight.  She needs to exercise regularly as well as controlling snacks and portions.  We talked extensively about this today.  She is at risk for diabetes.   I will check lipids, BMET, TSH.

## 2011-04-27 NOTE — Assessment & Plan Note (Signed)
Rare palpitations.  Has known PACs.  Had brother with atrial fibrillation in his 60s so there is likely a predisposition towards atrial fibrillation for her.  She needs to lose weight as obesity increases the risk.

## 2011-04-29 ENCOUNTER — Other Ambulatory Visit (INDEPENDENT_AMBULATORY_CARE_PROVIDER_SITE_OTHER): Payer: BC Managed Care – PPO | Admitting: *Deleted

## 2011-04-29 DIAGNOSIS — R002 Palpitations: Secondary | ICD-10-CM

## 2011-04-29 LAB — CBC WITH DIFFERENTIAL/PLATELET
Basophils Absolute: 0 10*3/uL (ref 0.0–0.1)
HCT: 45.5 % (ref 36.0–46.0)
Lymphs Abs: 2 10*3/uL (ref 0.7–4.0)
MCV: 85.4 fl (ref 78.0–100.0)
Monocytes Absolute: 0.3 10*3/uL (ref 0.1–1.0)
Neutro Abs: 4.9 10*3/uL (ref 1.4–7.7)
Platelets: 260 10*3/uL (ref 150.0–400.0)
RDW: 13.2 % (ref 11.5–14.6)

## 2011-04-29 LAB — HEPATIC FUNCTION PANEL
Bilirubin, Direct: 0 mg/dL (ref 0.0–0.3)
Total Bilirubin: 0.6 mg/dL (ref 0.3–1.2)
Total Protein: 5.8 g/dL — ABNORMAL LOW (ref 6.0–8.3)

## 2011-04-29 LAB — LIPID PANEL
Cholesterol: 176 mg/dL (ref 0–200)
HDL: 47.3 mg/dL (ref >39.00)
Total CHOL/HDL Ratio: 4
Triglycerides: 135 mg/dL (ref 0.0–149.0)

## 2011-04-29 LAB — BASIC METABOLIC PANEL
BUN: 29 mg/dL — ABNORMAL HIGH (ref 6–23)
CO2: 29 mEq/L (ref 19–32)
Calcium: 8.2 mg/dL — ABNORMAL LOW (ref 8.4–10.5)
Creatinine, Ser: 1 mg/dL (ref 0.4–1.2)

## 2011-04-29 LAB — TSH: TSH: 1.82 u[IU]/mL (ref 0.35–5.50)

## 2011-05-04 ENCOUNTER — Telehealth: Payer: Self-pay | Admitting: Cardiology

## 2011-05-04 NOTE — Telephone Encounter (Signed)
Pt had labs done last week and would like her results

## 2011-05-04 NOTE — Telephone Encounter (Signed)
Discussed recent lab results with pt.  

## 2011-06-15 ENCOUNTER — Telehealth: Payer: Self-pay | Admitting: Cardiology

## 2011-06-15 NOTE — Telephone Encounter (Signed)
Patient called because she said the cholesterol results in the office on January 2013 was 178 she has cholesterol checked with  her company new insurance it was 218 pt wonders why it is it so different. Patient states she will go with our blood work  Results because she thing it is more accurate.

## 2011-06-15 NOTE — Telephone Encounter (Signed)
Pt had cholesterol test done here and it was 178 in Jan/then at work last week had a insurance reading and it was 218, pt wants to know how there could be such a jump in just a month? pt [phone]

## 2012-05-01 ENCOUNTER — Telehealth: Payer: Self-pay | Admitting: Internal Medicine

## 2012-05-01 NOTE — Telephone Encounter (Signed)
Patient's last OV 2011, per Dr.Hopper patient will need an appointment. Patient aware and transferred to scheduling

## 2012-05-01 NOTE — Telephone Encounter (Signed)
pt stated she needs a letter stating it is ok for her to donate plasma CB# [phone] Pt has not been seen here since 2011

## 2012-05-08 ENCOUNTER — Encounter: Payer: Self-pay | Admitting: Internal Medicine

## 2012-05-08 ENCOUNTER — Ambulatory Visit (INDEPENDENT_AMBULATORY_CARE_PROVIDER_SITE_OTHER): Payer: BC Managed Care – PPO | Admitting: Internal Medicine

## 2012-05-08 VITALS — BP 138/80 | HR 72 | Wt 233.8 lb

## 2012-05-08 DIAGNOSIS — R7309 Other abnormal glucose: Secondary | ICD-10-CM

## 2012-05-08 DIAGNOSIS — G4730 Sleep apnea, unspecified: Secondary | ICD-10-CM

## 2012-05-08 DIAGNOSIS — I491 Atrial premature depolarization: Secondary | ICD-10-CM

## 2012-05-08 DIAGNOSIS — Z23 Encounter for immunization: Secondary | ICD-10-CM

## 2012-05-08 DIAGNOSIS — O99810 Abnormal glucose complicating pregnancy: Secondary | ICD-10-CM

## 2012-05-08 DIAGNOSIS — R030 Elevated blood-pressure reading, without diagnosis of hypertension: Secondary | ICD-10-CM

## 2012-05-08 DIAGNOSIS — J310 Chronic rhinitis: Secondary | ICD-10-CM

## 2012-05-08 LAB — BASIC METABOLIC PANEL
BUN: 22 mg/dL (ref 6–23)
CO2: 29 mEq/L (ref 19–32)
Chloride: 103 mEq/L (ref 96–112)
GFR: 72 mL/min (ref >60.00)
Glucose, Bld: 122 mg/dL — ABNORMAL HIGH (ref 70–99)
Potassium: 4 mEq/L (ref 3.5–5.1)
Sodium: 139 mEq/L (ref 135–145)

## 2012-05-08 LAB — MICROALBUMIN / CREATININE URINE RATIO: Microalb Creat Ratio: 0.4 mg/g (ref 0.0–30.0)

## 2012-05-08 MED ORDER — FLUTICASONE PROPIONATE 50 MCG/ACT NA SUSP: 1.0000 | Freq: Two times a day (BID) | NASAL | Status: DC | PRN

## 2012-05-08 NOTE — Assessment & Plan Note (Signed)
BP controlled ; fasting BMET ordered

## 2012-05-08 NOTE — Patient Instructions (Addendum)
To prevent palpitations or premature beats, avoid stimulants such as decongestants, diet pills, nicotine, or caffeine (coffee, tea, cola, or chocolate) to excess.  Plain Mucinex (NOT D) for thick secretions ;force NON dairy fluids .   Nasal cleansing in the shower as discussed with lather of mild shampoo.After 10 seconds wash off lather while  exhaling through nostrils. Make sure that all residual soap is removed to prevent irritation.  Fluticasone 1 spray in each nostril twice a day as needed. Use the "crossover" technique into opposite nostril spraying toward opposite ear @ 45 degree angle, not straight up into nostril.  Use a Neti pot daily only  as needed for significant sinus congestion; going from open side to congested side . Plain Allegra (NOT D )  160 daily , Loratidine 10 mg , OR Zyrtec 10 mg @ bedtime  as needed for itchy eyes & sneezing.  If you activate My Chart; the results can be released to you as soon as they populate from the lab. If you choose not to use this program; the labs have to be reviewed, copied & mailed   causing a delay in getting the results to you.

## 2012-05-08 NOTE — Assessment & Plan Note (Signed)
Non fasting glucose 160; A1c & urine microalbumin ordered

## 2012-05-08 NOTE — Progress Notes (Signed)
  Subjective:    Patient ID: Margaret [name], female    DOB: 08-27-1955, 57 y.o.   MRN: [id number]  HPI Because of the history of gestational diabetes; [employer] has refused her from  donating plasma without medical clearance documentation.  Nonfasting labs performed 03/01/12 revealed an HDL of 59, LDL of 100, and glucose of 160.    Review of Systems PMH of elevated BP w/o diagnosis of HYPERTENSION: Disease Monitoring: Blood pressure range-120/70-138/80 Chest pain, palpitations- no       Dyspnea- no Lightheadedness,Syncope- no    Edema- no Medications: Compliance- never on BP meds  PMH of Gestational DIABETES in 1996: Disease Monitoring: Blood Sugar ranges-no monitor  Polyuria/phagia/dipsia- She does describe urinary frequency and polydipsia. Visual problems- no; last Ophth exam neg 1 year Medications: Compliance- no; diet controlled Hypoglycemic symptoms- intermittent but rare with small , frequent meals  HYPERLIPIDEMIA:see labs above Disease Monitoring: See symptoms for Hypertension Medications: Compliance- no meds   Abd pain, bowel changes- no; colonoscopy current   Muscle aches- Aleve for generalized  Stiffness for 6 years  Winter related nasal congestion; Afrin as needed           Objective:   Physical Exam Gen.: well-nourished in appearance. Alert, appropriate and cooperative throughout exam.   Eyes: No corneal or conjunctival inflammation noted. No lid lag or proptosis. Extraocular motion intact. Vision grossly normal. Ears: External  ear exam reveals no significant lesions or deformities. Canals clear .TMs normal. Hearing is grossly normal bilaterally. Nose: External nasal exam reveals no deformity or inflammation. Nasal mucosa are pink and moist. No lesions or exudates noted.   Mouth: Oral mucosa and oropharynx reveal no lesions or exudates. Teeth in good repair. Neck: No deformities, masses, or tenderness noted. Range of motion & Thyroid normal. Lungs:  Normal respiratory effort; chest expands symmetrically. Lungs are clear to auscultation without rales, wheezes, or increased work of breathing. Heart: Normal rate and rhythm. Normal S1 and S2. No gallop, click, or rub.S 4 w/o murmur. Abdomen: Bowel sounds normal; abdomen soft and nontender. No masses, organomegaly or hernias noted.                                  Musculoskeletal/extremities:  No clubbing, cyanosis, edema, or significant extremity  deformity noted. Tone & strength  normal.Joints normal . Nail health good. Able to lie down & sit up w/o help.  Vascular: Carotid, radial artery, dorsalis pedis and  posterior tibial pulses are full and equal. No bruits present. Neurologic: Alert and oriented x3. Deep tendon reflexes symmetrical and normal. Light touch normal over feet. Skin: Intact without suspicious lesions or rashes. Lymph: No cervical, axillary lymphadenopathy present. Psych: Mood and affect are normal. Normally interactive                                                                                          Assessment & Plan:

## 2012-11-27 ENCOUNTER — Other Ambulatory Visit: Payer: Self-pay | Admitting: Obstetrics & Gynecology

## 2013-04-09 ENCOUNTER — Ambulatory Visit (INDEPENDENT_AMBULATORY_CARE_PROVIDER_SITE_OTHER): Payer: BC Managed Care – PPO | Admitting: Internal Medicine

## 2013-04-09 VITALS — BP 122/85 | HR 74 | Temp 98.7°F | Wt 240.0 lb

## 2013-04-09 DIAGNOSIS — K589 Irritable bowel syndrome without diarrhea: Secondary | ICD-10-CM

## 2013-04-09 MED ORDER — HYOSCYAMINE SULFATE 0.125 MG SL SUBL: 0.1250 mg | SUBLINGUAL_TABLET | SUBLINGUAL | Status: DC | PRN

## 2013-04-09 NOTE — Patient Instructions (Addendum)
Please take the probiotic , Align, every day until the symptoms have  resolved for 4 weeks & then as needed for loose stool. This will replace the normal bacteria which  are necessary for formation of normal stool and processing of food.

## 2013-04-09 NOTE — Progress Notes (Signed)
   Subjective:    Patient ID: Margaret [name], female    DOB: 11/14/1955, 57 y.o.   MRN: [id number]  HPI    She's been having diffuse, low grade abdominal discomfort in the context of increased work stress over the past year. She also describes feeling full after eating and increased gas  Dyspepsia is described it is minor.Other positive symptoms include some lethargy. She has occasional loose stool without frank diarrhea.  On isolated occasions she's noted some light-colored stools but this has not persisted. It is not associated with dark urine.   She has a history of irritable bowel and taken belladonna past. When she attempted to refill the prescription the cost was exorbitant.  A colon polyp removed in 2011; she is due for followup by 2016.   Review of Systems  She specifically denies fever, chills, change in weight, melena, hematemesis, or rectal bleeding.  She has been a plasma donor but recently she was diagnosed as having low protein. This is to be reevaluated in March of 2015.     Objective:   Physical Exam  General appearance :Central weight excess, but adequately nourished w/o distress.  Eyes: No conjunctival inflammation or scleral icterus is present.  Oral exam: Dental hygiene is good; lips and gums are healthy appearing.There is no oropharyngeal erythema or exudate noted.   Heart:  Normal rate and regular rhythm. S1 and S2 normal without gallop, murmur, click, rub or other extra sounds     Lungs:Chest clear to auscultation; no wheezes, rhonchi,rales ,or rubs present.No increased work of breathing.   Abdomen:Somewhat protuberant but bowel sounds normal, & soft and non-tender without masses, organomegaly or hernias noted.  No guarding or rebound .   Musculoskeletal: Able to lie flat and sit up without help.  Gait normal  Skin:Warm & dry.  Intact without suspicious lesions or rashes ; no jaundice or tenting  Lymphatic: No lymphadenopathy is noted about the  head, neck, axilla              Assessment & Plan:  #1 IBS #2 low protein See orders

## 2013-12-04 ENCOUNTER — Other Ambulatory Visit: Payer: Self-pay | Admitting: Obstetrics & Gynecology

## 2013-12-05 LAB — CYTOLOGY - PAP

## 2014-10-10 ENCOUNTER — Ambulatory Visit (INDEPENDENT_AMBULATORY_CARE_PROVIDER_SITE_OTHER): Payer: BLUE CROSS/BLUE SHIELD | Admitting: Internal Medicine

## 2014-10-10 ENCOUNTER — Encounter: Payer: Self-pay | Admitting: Internal Medicine

## 2014-10-10 ENCOUNTER — Other Ambulatory Visit (INDEPENDENT_AMBULATORY_CARE_PROVIDER_SITE_OTHER): Payer: BLUE CROSS/BLUE SHIELD

## 2014-10-10 VITALS — BP 130/62 | HR 80 | Temp 99.0°F | Resp 16 | Ht 64.0 in | Wt 229.0 lb

## 2014-10-10 DIAGNOSIS — R7301 Impaired fasting glucose: Secondary | ICD-10-CM | POA: Diagnosis not present

## 2014-10-10 DIAGNOSIS — Z0000 Encounter for general adult medical examination without abnormal findings: Secondary | ICD-10-CM

## 2014-10-10 DIAGNOSIS — E669 Obesity, unspecified: Secondary | ICD-10-CM

## 2014-10-10 LAB — COMPREHENSIVE METABOLIC PANEL
ALBUMIN: 4.1 g/dL (ref 3.5–5.2)
ALT: 27 U/L (ref 0–35)
AST: 21 U/L (ref 0–37)
Alkaline Phosphatase: 98 U/L (ref 39–117)
BUN: 22 mg/dL (ref 6–23)
CALCIUM: 9.4 mg/dL (ref 8.4–10.5)
CHLORIDE: 105 meq/L (ref 96–112)
CO2: 30 meq/L (ref 19–32)
CREATININE: 0.91 mg/dL (ref 0.40–1.20)
GFR: 67.76 mL/min (ref >60.00)
GLUCOSE: 111 mg/dL — AB (ref 70–99)
POTASSIUM: 4.2 meq/L (ref 3.5–5.1)
Sodium: 140 mEq/L (ref 135–145)
Total Bilirubin: 0.4 mg/dL (ref 0.2–1.2)
Total Protein: 7.2 g/dL (ref 6.0–8.3)

## 2014-10-10 LAB — HEMOGLOBIN A1C: Hgb A1c MFr Bld: 6.4 % (ref 4.6–6.5)

## 2014-10-10 LAB — LIPID PANEL
CHOL/HDL RATIO: 3
Cholesterol: 194 mg/dL (ref 0–200)
HDL: 61.8 mg/dL (ref >39.00)
LDL CALC: 102 mg/dL — AB (ref 0–99)
NONHDL: 132.2
Triglycerides: 149 mg/dL (ref 0.0–149.0)
VLDL: 29.8 mg/dL (ref 0.0–40.0)

## 2014-10-10 LAB — TSH: TSH: 1.44 u[IU]/mL (ref 0.35–4.50)

## 2014-10-10 NOTE — Progress Notes (Signed)
Pre visit review using our clinic review tool, if applicable. No additional management support is needed unless otherwise documented below in the visit note. 

## 2014-10-10 NOTE — Patient Instructions (Signed)
We will check on the blood work today and call you back with the results.   The itchiness is generally from dry skin and scalp and you can work on washing the hair only 3-4 times per week to let some natural oils build up again. I would recommend a non-scented lotion on the hands and arms.   The stiffness is likely from early arthritis and exercising is good for the joints and helps to keep them from getting stiff. The extra weight is also likely causing your joints to wear out faster than they would otherwise.   Health Maintenance Adopting a healthy lifestyle and getting preventive care can go a long way to promote health and wellness. Talk with your health care provider about what schedule of regular examinations is right for you. This is a good chance for you to check in with your provider about disease prevention and staying healthy. In between checkups, there are plenty of things you can do on your own. Experts have done a lot of research about which lifestyle changes and preventive measures are most likely to keep you healthy. Ask your health care provider for more information. WEIGHT AND DIET  Eat a healthy diet  Be sure to include plenty of vegetables, fruits, low-fat dairy products, and lean protein.  Do not eat a lot of foods high in solid fats, added sugars, or salt.  Get regular exercise. This is one of the most important things you can do for your health.  Most adults should exercise for at least 150 minutes each week. The exercise should increase your heart rate and make you sweat (moderate-intensity exercise).  Most adults should also do strengthening exercises at least twice a week. This is in addition to the moderate-intensity exercise.  Maintain a healthy weight  Body mass index (BMI) is a measurement that can be used to identify possible weight problems. It estimates body fat based on height and weight. Your health care provider can help determine your BMI and help you  achieve or maintain a healthy weight.  For females 10 years of age and older:   A BMI below 18.5 is considered underweight.  A BMI of 18.5 to 24.9 is normal.  A BMI of 25 to 29.9 is considered overweight.  A BMI of 30 and above is considered obese.  Watch levels of cholesterol and blood lipids  You should start having your blood tested for lipids and cholesterol at 57 years of age, then have this test every 5 years.  You may need to have your cholesterol levels checked more often if:  Your lipid or cholesterol levels are high.  You are older than 57 years of age.  You are at high risk for heart disease.  CANCER SCREENING   Lung Cancer  Lung cancer screening is recommended for adults 16-17 years old who are at high risk for lung cancer because of a history of smoking.  A yearly low-dose CT scan of the lungs is recommended for people who:  Currently smoke.  Have quit within the past 15 years.  Have at least a 30-pack-year history of smoking. A pack year is smoking an average of one pack of cigarettes a day for 1 year.  Yearly screening should continue until it has been 15 years since you quit.  Yearly screening should stop if you develop a health problem that would prevent you from having lung cancer treatment.  Breast Cancer  Practice breast self-awareness. This means understanding how your breasts  normally appear and feel.  It also means doing regular breast self-exams. Let your health care provider know about any changes, no matter how small.  If you are in your 20s or 30s, you should have a clinical breast exam (CBE) by a health care provider every 1-3 years as part of a regular health exam.  If you are 15 or older, have a CBE every year. Also consider having a breast X-ray (mammogram) every year.  If you have a family history of breast cancer, talk to your health care provider about genetic screening.  If you are at high risk for breast cancer, talk to your  health care provider about having an MRI and a mammogram every year.  Breast cancer gene (BRCA) assessment is recommended for women who have family members with BRCA-related cancers. BRCA-related cancers include:  Breast.  Ovarian.  Tubal.  Peritoneal cancers.  Results of the assessment will determine the need for genetic counseling and BRCA1 and BRCA2 testing. Cervical Cancer Routine pelvic examinations to screen for cervical cancer are no longer recommended for nonpregnant women who are considered low risk for cancer of the pelvic organs (ovaries, uterus, and vagina) and who do not have symptoms. A pelvic examination may be necessary if you have symptoms including those associated with pelvic infections. Ask your health care provider if a screening pelvic exam is right for you.   The Pap test is the screening test for cervical cancer for women who are considered at risk.  If you had a hysterectomy for a problem that was not cancer or a condition that could lead to cancer, then you no longer need Pap tests.  If you are older than 65 years, and you have had normal Pap tests for the past 10 years, you no longer need to have Pap tests.  If you have had past treatment for cervical cancer or a condition that could lead to cancer, you need Pap tests and screening for cancer for at least 20 years after your treatment.  If you no longer get a Pap test, assess your risk factors if they change (such as having a new sexual partner). This can affect whether you should start being screened again.  Some women have medical problems that increase their chance of getting cervical cancer. If this is the case for you, your health care provider may recommend more frequent screening and Pap tests.  The human papillomavirus (HPV) test is another test that may be used for cervical cancer screening. The HPV test looks for the virus that can cause cell changes in the cervix. The cells collected during the Pap  test can be tested for HPV.  The HPV test can be used to screen women 20 years of age and older. Getting tested for HPV can extend the interval between normal Pap tests from three to five years.  An HPV test also should be used to screen women of any age who have unclear Pap test results.  After 57 years of age, women should have HPV testing as often as Pap tests.  Colorectal Cancer  This type of cancer can be detected and often prevented.  Routine colorectal cancer screening usually begins at 57 years of age and continues through 57 years of age.  Your health care provider may recommend screening at an earlier age if you have risk factors for colon cancer.  Your health care provider may also recommend using home test kits to check for hidden blood in the stool.  A small camera at the end of a tube can be used to examine your colon directly (sigmoidoscopy or colonoscopy). This is done to check for the earliest forms of colorectal cancer.  Routine screening usually begins at age 52.  Direct examination of the colon should be repeated every 5-10 years through 57 years of age. However, you may need to be screened more often if early forms of precancerous polyps or small growths are found. Skin Cancer  Check your skin from head to toe regularly.  Tell your health care provider about any new moles or changes in moles, especially if there is a change in a mole's shape or color.  Also tell your health care provider if you have a mole that is larger than the size of a pencil eraser.  Always use sunscreen. Apply sunscreen liberally and repeatedly throughout the day.  Protect yourself by wearing long sleeves, pants, a wide-brimmed hat, and sunglasses whenever you are outside. HEART DISEASE, DIABETES, AND HIGH BLOOD PRESSURE   Have your blood pressure checked at least every 1-2 years. High blood pressure causes heart disease and increases the risk of stroke.  If you are between 28 years  and 29 years old, ask your health care provider if you should take aspirin to prevent strokes.  Have regular diabetes screenings. This involves taking a blood sample to check your fasting blood sugar level.  If you are at a normal weight and have a low risk for diabetes, have this test once every three years after 57 years of age.  If you are overweight and have a high risk for diabetes, consider being tested at a younger age or more often. PREVENTING INFECTION  Hepatitis B  If you have a higher risk for hepatitis B, you should be screened for this virus. You are considered at high risk for hepatitis B if:  You were born in a country where hepatitis B is common. Ask your health care provider which countries are considered high risk.  Your parents were born in a high-risk country, and you have not been immunized against hepatitis B (hepatitis B vaccine).  You have HIV or AIDS.  You use needles to inject street drugs.  You live with someone who has hepatitis B.  You have had sex with someone who has hepatitis B.  You get hemodialysis treatment.  You take certain medicines for conditions, including cancer, organ transplantation, and autoimmune conditions. Hepatitis C  Blood testing is recommended for:  Everyone born from [birth year] through 1963.  Anyone with known risk factors for hepatitis C. Sexually transmitted infections (STIs)  You should be screened for sexually transmitted infections (STIs) including gonorrhea and chlamydia if:  You are sexually active and are younger than 57 years of age.  You are older than 57 years of age and your health care provider tells you that you are at risk for this type of infection.  Your sexual activity has changed since you were last screened and you are at an increased risk for chlamydia or gonorrhea. Ask your health care provider if you are at risk.  If you do not have HIV, but are at risk, it may be recommended that you take a prescription  medicine daily to prevent HIV infection. This is called pre-exposure prophylaxis (PrEP). You are considered at risk if:  You are sexually active and do not regularly use condoms or know the HIV status of your partner(s).  You take drugs by injection.  You are sexually active  with a partner who has HIV. Talk with your health care provider about whether you are at high risk of being infected with HIV. If you choose to begin PrEP, you should first be tested for HIV. You should then be tested every 3 months for as long as you are taking PrEP.  PREGNANCY   If you are premenopausal and you may become pregnant, ask your health care provider about preconception counseling.  If you may become pregnant, take 400 to 800 micrograms (mcg) of folic acid every day.  If you want to prevent pregnancy, talk to your health care provider about birth control (contraception). OSTEOPOROSIS AND MENOPAUSE   Osteoporosis is a disease in which the bones lose minerals and strength with aging. This can result in serious bone fractures. Your risk for osteoporosis can be identified using a bone density scan.  If you are 44 years of age or older, or if you are at risk for osteoporosis and fractures, ask your health care provider if you should be screened.  Ask your health care provider whether you should take a calcium or vitamin D supplement to lower your risk for osteoporosis.  Menopause may have certain physical symptoms and risks.  Hormone replacement therapy may reduce some of these symptoms and risks. Talk to your health care provider about whether hormone replacement therapy is right for you.  HOME CARE INSTRUCTIONS   Schedule regular health, dental, and eye exams.  Stay current with your immunizations.   Do not use any tobacco products including cigarettes, chewing tobacco, or electronic cigarettes.  If you are pregnant, do not drink alcohol.  If you are breastfeeding, limit how much and how often you  drink alcohol.  Limit alcohol intake to no more than 1 drink per day for nonpregnant women. One drink equals 12 ounces of beer, 5 ounces of wine, or 1 ounces of hard liquor.  Do not use street drugs.  Do not share needles.  Ask your health care provider for help if you need support or information about quitting drugs.  Tell your health care provider if you often feel depressed.  Tell your health care provider if you have ever been abused or do not feel safe at home. Document Released: 10/19/2010 Document Revised: 08/20/2013 Document Reviewed: 03/07/2013 [facility] Patient Information 2015 [place], [place]. This information is not intended to replace advice given to you by your health care provider. Make sure you discuss any questions you have with your health care provider.

## 2014-10-11 ENCOUNTER — Encounter: Payer: Self-pay | Admitting: Internal Medicine

## 2014-10-11 DIAGNOSIS — Z0000 Encounter for general adult medical examination without abnormal findings: Secondary | ICD-10-CM | POA: Insufficient documentation

## 2014-10-11 NOTE — Progress Notes (Signed)
   Subjective:    Patient ID: Margaret [name], female    DOB: 15-Jan-1956, 59 y.o.   MRN: [id number]  HPI The patient is a 59 Yo female coming in for wellness. No new concerns today. Please see A/P for status and treatment of chronic medical problems. She has not been here in several years so medications updated.   PMH, [facility], social history updated.   Review of Systems  Constitutional: Negative for fever, activity change, fatigue and unexpected weight change.  HENT: Negative.   Eyes: Negative.   Respiratory: Negative for cough, chest tightness, shortness of breath and wheezing.   Cardiovascular: Negative for chest pain, palpitations and leg swelling.  Gastrointestinal: Negative for nausea, abdominal pain, diarrhea, constipation and abdominal distention.  Musculoskeletal: Negative.   Skin: Negative.   Neurological: Negative.   Psychiatric/Behavioral: Negative.       Objective:   Physical Exam  Constitutional: She is oriented to person, place, and time. She appears well-developed and well-nourished.  Obese  HENT:  Head: Normocephalic and atraumatic.  Eyes: EOM are normal.  Neck: Normal range of motion.  Cardiovascular: Normal rate and regular rhythm.   Pulmonary/Chest: Effort normal and breath sounds normal.  Abdominal: Soft. Bowel sounds are normal. She exhibits no distension. There is no tenderness. There is no rebound.  Musculoskeletal: She exhibits no edema.  Neurological: She is alert and oriented to person, place, and time.  Skin: Skin is warm and dry.  Psychiatric: She has a normal mood and affect.   Filed Vitals:   10/10/14 1501  BP: 130/62  Pulse: 80  Temp: 99 F (37.2 C)  TempSrc: Oral  Resp: 16  Height: 5\' 4"  (1.626 m)  Weight: 229 lb (103.874 kg)  SpO2: 98%      Assessment & Plan:

## 2014-10-11 NOTE — Assessment & Plan Note (Signed)
Checking HgA1c today, talked to her about the need for weight loss. She is active and exercises. Talked to her about nutritionist but she does not think she needs right now.

## 2014-10-11 NOTE — Assessment & Plan Note (Signed)
Her weight is a challenge and she has been on weight medication in the past to help her lose weight. Checking labs today for complications. BP okay today.

## 2014-10-11 NOTE — Assessment & Plan Note (Signed)
Colonoscopy due in 2018, Mammogram up to date, pap smear up to date, tetanus up to date, gets flu shot yearly. Talked to her about weight as her biggest health concern that she needs to take seriously.

## 2014-10-23 ENCOUNTER — Other Ambulatory Visit: Payer: Self-pay | Admitting: Obstetrics

## 2014-10-23 DIAGNOSIS — N63 Unspecified lump in unspecified breast: Secondary | ICD-10-CM

## 2014-10-24 ENCOUNTER — Ambulatory Visit
Admission: RE | Admit: 2014-10-24 | Discharge: 2014-10-24 | Disposition: A | Discharge status: Home / Self Care | Payer: BLUE CROSS/BLUE SHIELD | Source: Ambulatory Visit | Attending: Obstetrics | Admitting: Obstetrics

## 2014-10-24 ENCOUNTER — Other Ambulatory Visit: Payer: Self-pay | Admitting: Obstetrics

## 2014-10-24 DIAGNOSIS — N63 Unspecified lump in unspecified breast: Secondary | ICD-10-CM

## 2014-10-24 LAB — HM MAMMOGRAPHY

## 2014-11-07 ENCOUNTER — Inpatient Hospital Stay: Admission: RE | Admit: 2014-11-07 | Payer: BLUE CROSS/BLUE SHIELD | Source: Ambulatory Visit

## 2014-11-13 ENCOUNTER — Other Ambulatory Visit: Payer: Self-pay | Admitting: Obstetrics

## 2014-11-13 ENCOUNTER — Ambulatory Visit
Admission: RE | Admit: 2014-11-13 | Discharge: 2014-11-13 | Disposition: A | Discharge status: Home / Self Care | Payer: BLUE CROSS/BLUE SHIELD | Source: Ambulatory Visit | Attending: Obstetrics | Admitting: Obstetrics

## 2014-11-13 ENCOUNTER — Encounter: Payer: Self-pay | Admitting: *Deleted

## 2014-11-13 DIAGNOSIS — N63 Unspecified lump in unspecified breast: Secondary | ICD-10-CM

## 2014-12-09 ENCOUNTER — Ambulatory Visit
Admission: RE | Admit: 2014-12-09 | Discharge: 2014-12-09 | Disposition: A | Discharge status: Home / Self Care | Payer: BLUE CROSS/BLUE SHIELD | Source: Ambulatory Visit | Attending: Obstetrics | Admitting: Obstetrics

## 2014-12-09 ENCOUNTER — Other Ambulatory Visit: Payer: Self-pay | Admitting: Obstetrics

## 2014-12-09 DIAGNOSIS — N63 Unspecified lump in unspecified breast: Secondary | ICD-10-CM

## 2015-04-08 ENCOUNTER — Other Ambulatory Visit (INDEPENDENT_AMBULATORY_CARE_PROVIDER_SITE_OTHER): Payer: BLUE CROSS/BLUE SHIELD

## 2015-04-08 ENCOUNTER — Encounter: Payer: Self-pay | Admitting: Internal Medicine

## 2015-04-08 ENCOUNTER — Ambulatory Visit (INDEPENDENT_AMBULATORY_CARE_PROVIDER_SITE_OTHER): Payer: BLUE CROSS/BLUE SHIELD | Admitting: Internal Medicine

## 2015-04-08 ENCOUNTER — Other Ambulatory Visit: Payer: Self-pay | Admitting: Internal Medicine

## 2015-04-08 VITALS — BP 130/90 | HR 72 | Temp 99.1°F | Resp 16 | Ht 64.0 in | Wt 243.0 lb

## 2015-04-08 DIAGNOSIS — F329 Major depressive disorder, single episode, unspecified: Secondary | ICD-10-CM

## 2015-04-08 DIAGNOSIS — R7301 Impaired fasting glucose: Secondary | ICD-10-CM

## 2015-04-08 DIAGNOSIS — F32A Depression, unspecified: Secondary | ICD-10-CM | POA: Insufficient documentation

## 2015-04-08 LAB — HEMOGLOBIN A1C: HEMOGLOBIN A1C: 7.3 % — AB (ref 4.6–6.5)

## 2015-04-08 LAB — TSH: TSH: 1.49 u[IU]/mL (ref 0.35–4.50)

## 2015-04-08 MED ORDER — LORCASERIN HCL 10 MG PO TABS: 10.0000 mg | ORAL_TABLET | Freq: Two times a day (BID) | ORAL | Status: DC

## 2015-04-08 NOTE — Assessment & Plan Note (Signed)
Weight is up from last visit by at least 10 pounds. Rx for belviq due to better side effect profile. Checking HgA1c. Talked to her about ways to decrease eating from boredom or stress at work with keeping her hands busy or other activities to soothe herself. She does exercise and walks 40 minutes daily and tennis weekly usually.

## 2015-04-08 NOTE — Progress Notes (Signed)
   Subjective:    Patient ID: Margaret [name], female    DOB: 25-Nov-1955, 59 y.o.   MRN: [id number]  HPI The patient is a 59 YO female coming in for her weight. She is slightly depressed about her weight. Her new job has a lot less stress but does have more free time and people tend to bring in food and so she is eating more. She does get a lot of anxiety and she soothes herself with food. Has tried paxil in the past and did not like how it made her feel and still feels like she has side effects from it.   Review of Systems  Constitutional: Positive for unexpected weight change. Negative for fever, activity change and fatigue.  Respiratory: Negative for cough, chest tightness, shortness of breath and wheezing.   Cardiovascular: Negative for chest pain, palpitations and leg swelling.  Gastrointestinal: Negative for nausea, abdominal pain, diarrhea, constipation and abdominal distention.  Musculoskeletal: Negative.   Skin: Negative.   Neurological: Negative.   Psychiatric/Behavioral: Positive for dysphoric mood and agitation. Negative for suicidal ideas, sleep disturbance and self-injury. The patient is nervous/anxious.       Objective:   Physical Exam  Constitutional: She is oriented to person, place, and time. She appears well-developed and well-nourished.  Obese  HENT:  Head: Normocephalic and atraumatic.  Eyes: EOM are normal.  Neck: Normal range of motion.  Cardiovascular: Normal rate and regular rhythm.   Pulmonary/Chest: Effort normal and breath sounds normal.  Abdominal: Soft. Bowel sounds are normal. She exhibits no distension. There is no tenderness. There is no rebound.  Musculoskeletal: She exhibits no edema.  Neurological: She is alert and oriented to person, place, and time.  Skin: Skin is warm and dry.  Psychiatric: She has a normal mood and affect.  Slightly emotional during the visit   Filed Vitals:   04/08/15 0847  BP: 130/90  Pulse: 72  Temp: 99.1 F (37.3 C)   TempSrc: Oral  Resp: 16  Height: 5\' 4"  (1.626 m)  Weight: 243 lb (110.224 kg)  SpO2: 98%      Assessment & Plan:

## 2015-04-08 NOTE — Progress Notes (Signed)
Pre visit review using our clinic review tool, if applicable. No additional management support is needed unless otherwise documented below in the visit note. 

## 2015-04-08 NOTE — Patient Instructions (Signed)
We have given you the prescription for belviq for the cravings. Take 1 pill twice a day to help. Generally at 3 months we look to see how you are doing with the weight.   Think about trying some counseling to find other habits to comfort yourself other than food. Consider taking up some other habits to occupy your hands so that you can keep busy at work.   Serving Sizes A serving size is a measured amount of food or drink, such as one slice of bread, that has an associated nutrient content. Knowing the serving size of a food or drink can help you determine how much of that food you should consume.  WHAT IS THE SIZE OF ONE SERVING? The size of one healthy serving depends on the food or drink. To determine a serving size, read the food label. If the food or drink does not have a food label, try to find serving size information online. Or, use the following to estimate the size of one adult serving:  Grain 1 slice bread.  bagel.  cup pasta.  Vegetable  cup cooked or canned vegetables. 1 cup raw, leafy greens.  Fruit  cup canned fruit. 1 medium fruit.  cup dried fruit.  Meat and Other Protein Sources 1 oz meat, poultry, or fish.  cup cooked beans. 1 egg.  cup nuts or seeds. 1 Tbsp nut butter.  cup tofu or tempeh. 2 Tbsp hummus.  Dairy An individual container of yogurt (6-8 oz). 1 piece of cheese the size of your thumb (1 oz). 1 cup (8 oz) milk or milk alternative.  Fat A piece the size of one dice. 1 tsp soft margarine. 1 Tbsp mayonnaise. 1 tsp vegetable oil. 1 Tbsp regular salad dressing. 2 Tbsp low-fat salad dressing.  HOW MANY SERVINGS SHOULD I EAT FROM EACH FOOD GROUP EACH DAY?  The following are the suggested number of servings to try and have every day from each food group. You can also look at your eating throughout the week and aim for meeting these requirements on most days for overall healthy eating.  Grain 6-8 servings. Try to have half of your grains from whole grains, such as  whole wheat bread, corn tortillas, oatmeal, brown rice, whole wheat pasta, and bulgur. Vegetable At least 2-3 servings.  Fruit 2 servings.  Meat and Other Protein Foods 5-6 servings. Aim to have lean proteins, such as chicken, [place], fish, beans, or tofu. Dairy 3 servings. Choose low-fat or nonfat if you are trying to control your weight.  Fat 2-3 servings.  IS A SERVING THE SAME THING AS A PORTION? No. A portion is the actual amount you eat, which may be more than one serving. Knowing the specific serving size of a food and the nutritional information that goes with it can help you make a healthy decision on what size portion to eat.  WHAT ARE SOME TIPS TO HELP ME LEARN HEALTHY SERVING SIZES?  Check food labels for serving sizes. Many foods that come as a single portion actually contain multiple servings.  Determine the serving size of foods you commonly eat and figure out how large a portion you usually eat.  Measure the number of servings that can be held by the bowls, glasses, cups, and plates you typically use. For example, pour your breakfast cereal into your regular bowl and then pour it into a measuring cup.  For 1-2 days, measure the serving sizes of all the foods you eat.  Practice estimating  serving sizes and determining how big your portions should be.   This information is not intended to replace advice given to you by your health care provider. Make sure you discuss any questions you have with your health care provider.   Document Released: 01/02/2003 Document Revised: 04/26/2014 Document Reviewed: 07/03/2013 Elsevier Interactive Patient Education [employer].

## 2015-04-08 NOTE — Assessment & Plan Note (Signed)
Checking HgA1c, last 6.4 and has never been in the diabetic range in our records. Does have history of gestational diabetes and family history of type 2 diabetes which increases her risk of progression. She is serious about working on her weight.

## 2015-04-08 NOTE — Assessment & Plan Note (Signed)
At this time mild with some mild anxiety. Is coping with food and have recommended counseling to help her develop better coping skills. Does not want to try medicine at this time. She does exercise and we talked about other relaxation techniques to help with her mood.

## 2015-04-11 ENCOUNTER — Ambulatory Visit: Payer: BLUE CROSS/BLUE SHIELD | Admitting: Internal Medicine

## 2015-05-26 ENCOUNTER — Encounter: Payer: Self-pay | Admitting: Internal Medicine

## 2015-05-26 ENCOUNTER — Ambulatory Visit (INDEPENDENT_AMBULATORY_CARE_PROVIDER_SITE_OTHER): Payer: BLUE CROSS/BLUE SHIELD | Admitting: Internal Medicine

## 2015-05-26 VITALS — BP 140/98 | HR 76 | Temp 99.6°F | Resp 18 | Ht 64.0 in | Wt 233.0 lb

## 2015-05-26 DIAGNOSIS — B349 Viral infection, unspecified: Secondary | ICD-10-CM

## 2015-05-26 DIAGNOSIS — J988 Other specified respiratory disorders: Principal | ICD-10-CM

## 2015-05-26 DIAGNOSIS — B9789 Other viral agents as the cause of diseases classified elsewhere: Secondary | ICD-10-CM

## 2015-05-26 MED ORDER — FLUTICASONE PROPIONATE 50 MCG/ACT NA SUSP: 2.0000 | Freq: Every day | NASAL | Status: DC

## 2015-05-26 MED ORDER — METHYLPREDNISOLONE ACETATE 40 MG/ML IJ SUSP
40.0000 mg | Freq: Once | INTRAMUSCULAR | Status: AC
  Administered 2015-05-26: 40 mg via INTRAMUSCULAR

## 2015-05-26 MED ORDER — HYDROCODONE-HOMATROPINE 5-1.5 MG/5ML PO SYRP: 5.0000 mL | ORAL_SOLUTION | Freq: Three times a day (TID) | ORAL | Status: DC | PRN

## 2015-05-26 MED ORDER — ALBUTEROL SULFATE HFA 108 (90 BASE) MCG/ACT IN AERS: 2.0000 | INHALATION_SPRAY | Freq: Four times a day (QID) | RESPIRATORY_TRACT | Status: DC | PRN

## 2015-05-26 NOTE — Patient Instructions (Signed)
We have sent in flonase which is a nose spray for the congestion. Use 2 sprays in each nostril once a day to help this to dry up more quickly.   We think that you have a respiratory virus which can take 10 days to fully clear. Generally symptoms are worst on day 4-5 and then start to improve.   We have given you a prednisone shot today to help the lungs. We have sent in an inhaler if you need it for breathing problems. We have also given you a cough syrup that you can use. It can make you sleepy or drowsy so do not drive after taking until you know how it will affect you.   Upper Respiratory Infection, Adult Most upper respiratory infections (URIs) are a viral infection of the air passages leading to the lungs. A URI affects the nose, throat, and upper air passages. The most common type of URI is nasopharyngitis and is typically referred to as "the common cold." URIs run their course and usually go away on their own. Most of the time, a URI does not require medical attention, but sometimes a bacterial infection in the upper airways can follow a viral infection. This is called a secondary infection. Sinus and middle ear infections are common types of secondary upper respiratory infections. Bacterial pneumonia can also complicate a URI. A URI can worsen asthma and chronic obstructive pulmonary disease (COPD). Sometimes, these complications can require emergency medical care and may be life threatening.  CAUSES Almost all URIs are caused by viruses. A virus is a type of germ and can spread from one person to another.  RISKS FACTORS You may be at risk for a URI if:   You smoke.   You have chronic heart or lung disease.  You have a weakened defense (immune) system.   You are very young or very old.   You have nasal allergies or asthma.  You work in crowded or poorly ventilated areas.  You work in health care facilities or schools. SIGNS AND SYMPTOMS  Symptoms typically develop 2-3 days  after you come in contact with a cold virus. Most viral URIs last 7-10 days. However, viral URIs from the influenza virus (flu virus) can last 14-18 days and are typically more severe. Symptoms may include:   Runny or stuffy (congested) nose.   Sneezing.   Cough.   Sore throat.   Headache.   Fatigue.   Fever.   Loss of appetite.   Pain in your forehead, behind your eyes, and over your cheekbones (sinus pain).  Muscle aches.  DIAGNOSIS  Your health care provider may diagnose a URI by:  Physical exam.  Tests to check that your symptoms are not due to another condition such as:  Strep throat.  Sinusitis.  Pneumonia.  Asthma. TREATMENT  A URI goes away on its own with time. It cannot be cured with medicines, but medicines may be prescribed or recommended to relieve symptoms. Medicines may help:  Reduce your fever.  Reduce your cough.  Relieve nasal congestion. HOME CARE INSTRUCTIONS   Take medicines only as directed by your health care provider.   Gargle warm saltwater or take cough drops to comfort your throat as directed by your health care provider.  Use a warm mist humidifier or inhale steam from a shower to increase air moisture. This may make it easier to breathe.  Drink enough fluid to keep your urine clear or pale yellow.   Eat soups and other  clear broths and maintain good nutrition.   Rest as needed.   Return to work when your temperature has returned to normal or as your health care provider advises. You may need to stay home longer to avoid infecting others. You can also use a face mask and careful hand washing to prevent spread of the virus.  Increase the usage of your inhaler if you have asthma.   Do not use any tobacco products, including cigarettes, chewing tobacco, or electronic cigarettes. If you need help quitting, ask your health care provider. PREVENTION  The best way to protect yourself from getting a cold is to practice  good hygiene.   Avoid oral or hand contact with people with cold symptoms.   Wash your hands often if contact occurs.  There is no clear evidence that vitamin C, vitamin E, echinacea, or exercise reduces the chance of developing a cold. However, it is always recommended to get plenty of rest, exercise, and practice good nutrition.  SEEK MEDICAL CARE IF:   You are getting worse rather than better.   Your symptoms are not controlled by medicine.   You have chills.  You have worsening shortness of breath.  You have brown or red mucus.  You have yellow or brown nasal discharge.  You have pain in your face, especially when you bend forward.  You have a fever.  You have swollen neck glands.  You have pain while swallowing.  You have white areas in the back of your throat. SEEK IMMEDIATE MEDICAL CARE IF:   You have severe or persistent:  Headache.  Ear pain.  Sinus pain.  Chest pain.  You have chronic lung disease and any of the following:  Wheezing.  Prolonged cough.  Coughing up blood.  A change in your usual mucus.  You have a stiff neck.  You have changes in your:  Vision.  Hearing.  Thinking.  Mood. MAKE SURE YOU:   Understand these instructions.  Will watch your condition.  Will get help right away if you are not doing well or get worse.   This information is not intended to replace advice given to you by your health care provider. Make sure you discuss any questions you have with your health care provider.   Document Released: 09/29/2000 Document Revised: 08/20/2014 Document Reviewed: 07/11/2013 Elsevier Interactive Patient Education [employer].

## 2015-05-26 NOTE — Progress Notes (Signed)
Pre visit review using our clinic review tool, if applicable. No additional management support is needed unless otherwise documented below in the visit note. 

## 2015-05-28 DIAGNOSIS — B349 Viral infection, unspecified: Secondary | ICD-10-CM | POA: Insufficient documentation

## 2015-05-28 NOTE — Progress Notes (Signed)
   Subjective:    Patient ID: Margaret [name], female    DOB: [birth year], 60 y.o.   MRN: [id number]  HPI The patient is a 60 YO female coming in for fevers and cough. She is also having mild congestion. Illness started 4 days ago. Overall is stable. Non-productive cough. Has been taking some tylenol which has helped the fever some. Has been taking cold medicine over the counter and has not taken her medications today for blood pressure. She had several sick contacts. Mild SOB with exertion but no wheezing.   Review of Systems  Constitutional: Positive for fever and chills. Negative for activity change, appetite change and fatigue.  HENT: Positive for congestion, postnasal drip and rhinorrhea. Negative for ear discharge, ear pain, sinus pressure, sore throat and trouble swallowing.   Respiratory: Positive for cough and shortness of breath. Negative for chest tightness and wheezing.   Cardiovascular: Negative for chest pain, palpitations and leg swelling.  Gastrointestinal: Negative for nausea, abdominal pain, diarrhea, constipation and abdominal distention.  Musculoskeletal: Negative.   Skin: Negative.   Neurological: Negative.   Psychiatric/Behavioral: Positive for dysphoric mood and agitation. Negative for suicidal ideas, sleep disturbance and self-injury. The patient is nervous/anxious.       Objective:   Physical Exam  Constitutional: She appears well-developed and well-nourished.  HENT:  Head: Normocephalic and atraumatic.  Oropharynx with redness and clear drainage.  Eyes: EOM are normal.  Neck: Normal range of motion.  Cardiovascular: Normal rate and regular rhythm.   Pulmonary/Chest: Effort normal. No respiratory distress. She has wheezes. She has no rales. She exhibits no tenderness.  Some diffuse expiratory wheeze which partially clears with cough.  Abdominal: Soft.   Filed Vitals:   05/26/15 1602 05/26/15 1620  BP: 160/92 140/98  Pulse: 76   Temp: 99.6 F (37.6 C)     TempSrc: Oral   Resp: 18   Height: 5\' 4"  (1.626 m)   Weight: 233 lb (105.688 kg)   SpO2: 99%       Assessment & Plan:  Depo-medrol 40 mg IM given at visit.

## 2015-05-28 NOTE — Assessment & Plan Note (Signed)
Rx for albuterol inhaler for the SOB symptoms, depo-medrol IM given at visit for the wheezing on exam. No indication for antibiotics at this time and discussed likely course. Rx for flonase and advised to take for the congestion.

## 2015-09-01 ENCOUNTER — Ambulatory Visit: Payer: BLUE CROSS/BLUE SHIELD | Admitting: Internal Medicine

## 2015-09-05 ENCOUNTER — Encounter: Payer: Self-pay | Admitting: Internal Medicine

## 2015-09-05 ENCOUNTER — Other Ambulatory Visit (INDEPENDENT_AMBULATORY_CARE_PROVIDER_SITE_OTHER): Payer: BLUE CROSS/BLUE SHIELD

## 2015-09-05 ENCOUNTER — Ambulatory Visit (INDEPENDENT_AMBULATORY_CARE_PROVIDER_SITE_OTHER): Payer: BLUE CROSS/BLUE SHIELD | Admitting: Internal Medicine

## 2015-09-05 VITALS — BP 140/88 | HR 71 | Temp 98.8°F | Resp 18 | Ht 64.5 in | Wt 236.0 lb

## 2015-09-05 DIAGNOSIS — E669 Obesity, unspecified: Secondary | ICD-10-CM

## 2015-09-05 DIAGNOSIS — Z0000 Encounter for general adult medical examination without abnormal findings: Secondary | ICD-10-CM | POA: Diagnosis not present

## 2015-09-05 DIAGNOSIS — Z1159 Encounter for screening for other viral diseases: Secondary | ICD-10-CM

## 2015-09-05 DIAGNOSIS — E1169 Type 2 diabetes mellitus with other specified complication: Secondary | ICD-10-CM

## 2015-09-05 DIAGNOSIS — E119 Type 2 diabetes mellitus without complications: Secondary | ICD-10-CM

## 2015-09-05 LAB — COMPREHENSIVE METABOLIC PANEL
ALT: 25 U/L (ref 0–35)
AST: 22 U/L (ref 0–37)
Albumin: 3.9 g/dL (ref 3.5–5.2)
Alkaline Phosphatase: 86 U/L (ref 39–117)
BUN: 22 mg/dL (ref 6–23)
CALCIUM: 8.7 mg/dL (ref 8.4–10.5)
CHLORIDE: 107 meq/L (ref 96–112)
CO2: 26 meq/L (ref 19–32)
Creatinine, Ser: 0.82 mg/dL (ref 0.40–1.20)
GFR: 76.17 mL/min (ref >60.00)
Glucose, Bld: 120 mg/dL — ABNORMAL HIGH (ref 70–99)
Potassium: 3.9 mEq/L (ref 3.5–5.1)
Sodium: 141 mEq/L (ref 135–145)
Total Bilirubin: 0.4 mg/dL (ref 0.2–1.2)
Total Protein: 6.4 g/dL (ref 6.0–8.3)

## 2015-09-05 LAB — HIV ANTIBODY (ROUTINE TESTING W REFLEX): HIV 1&2 Ab, 4th Generation: NONREACTIVE

## 2015-09-05 LAB — HEMOGLOBIN A1C: HEMOGLOBIN A1C: 7.1 % — AB (ref 4.6–6.5)

## 2015-09-05 LAB — LIPID PANEL
CHOL/HDL RATIO: 4
Cholesterol: 185 mg/dL (ref 0–200)
HDL: 52.1 mg/dL (ref >39.00)
LDL CALC: 104 mg/dL — AB (ref 0–99)
NONHDL: 132.75
Triglycerides: 146 mg/dL (ref 0.0–149.0)
VLDL: 29.2 mg/dL (ref 0.0–40.0)

## 2015-09-05 LAB — TSH: TSH: 1.65 u[IU]/mL (ref 0.35–4.50)

## 2015-09-05 NOTE — Patient Instructions (Signed)
We will get you in with a nutritionist to help with the diet and suggestions for less carbs and sugar.   We will have you check the blood pressure at home 1-2 times per month and if it is still high (top number >150 or bottom number >90) call us and we may have you come back for a check.   We are checking the labs today including for sugar and thyroid and hepatitis C and will call back with the results.   Diabetes and Standards of Medical Care Diabetes is complicated. You may find that your diabetes team includes a dietitian, nurse, diabetes educator, eye doctor, and more. To help everyone know what is going on and to help you get the care you deserve, the following schedule of care was developed to help keep you on track. Below are the tests, exams, vaccines, medicines, education, and plans you will need. HbA1c test This test shows how well you have controlled your glucose over the past 2-3 months. It is used to see if your diabetes management plan needs to be adjusted.   It is performed at least 2 times a year if you are meeting treatment goals.  It is performed 4 times a year if therapy has changed or if you are not meeting treatment goals. Blood pressure test  This test is performed at every routine medical visit. The goal is less than 140/90 mm Hg for most people, but 130/80 mm Hg in some cases. Ask your health care provider about your goal. Dental exam  Follow up with the dentist regularly. Eye exam  If you are diagnosed with type 1 diabetes as a child, get an exam upon reaching the age of 10 years or older and having had diabetes for 3-5 years. Yearly eye exams are recommended after that initial eye exam.  If you are diagnosed with type 1 diabetes as an adult, get an exam within 5 years of diagnosis and then yearly.  If you are diagnosed with type 2 diabetes, get an exam as soon as possible after the diagnosis and then yearly. Foot care exam  Visual foot exams are performed at  every routine medical visit. The exams check for cuts, injuries, or other problems with the feet.  You should have a complete foot exam performed every year. This exam includes an inspection of the structure and skin of your feet, a check of the pulses in your feet, and a check of the sensation in your feet.  Type 1 diabetes: The first exam is performed 5 years after diagnosis.  Type 2 diabetes: The first exam is performed at the time of diagnosis.  Check your feet nightly for cuts, injuries, or other problems with your feet. Tell your health care provider if anything is not healing. Kidney function test (urine microalbumin)  This test is performed once a year.  Type 1 diabetes: The first test is performed 5 years after diagnosis.  Type 2 diabetes: The first test is performed at the time of diagnosis.  A serum creatinine and estimated glomerular filtration rate (eGFR) test is done once a year to assess the level of chronic kidney disease (CKD), if present. Lipid profile (cholesterol, HDL, LDL, triglycerides)  Performed every 5 years for most people.  The goal for LDL is less than 100 mg/dL. If you are at high risk, the goal is less than 70 mg/dL.  The goal for HDL is 40 mg/dL-50 mg/dL for men and 50 HE/IO-98 mg/dL for women. An  HDL cholesterol of 60 mg/dL or higher gives some protection against heart disease.  The goal for triglycerides is less than 150 mg/dL. Immunizations  The flu (influenza) vaccine is recommended yearly for every person 48 months of age or older who has diabetes.  The pneumonia (pneumococcal) vaccine is recommended for every person 77 years of age or older who has diabetes. Adults 70 years of age or older may receive the pneumonia vaccine as a series of two separate shots.  The hepatitis B vaccine is recommended for adults shortly after they have been diagnosed with diabetes.  The Tdap (tetanus, diphtheria, and pertussis) vaccine should be given:  According to  normal childhood vaccination schedules, for children.  Every 10 years, for adults who have diabetes. Diabetes self-management education  Education is recommended at diagnosis and ongoing as needed. Treatment plan  Your treatment plan is reviewed at every medical visit.   This information is not intended to replace advice given to you by your health care provider. Make sure you discuss any questions you have with your health care provider.   Document Released: 01/31/2009 Document Revised: 04/26/2014 Document Reviewed: 09/05/2012 Elsevier Interactive Patient Education [employer].

## 2015-09-05 NOTE — Progress Notes (Signed)
Pre visit review using our clinic review tool, if applicable. No additional management support is needed unless otherwise documented below in the visit note. 

## 2015-09-06 LAB — HEPATITIS C ANTIBODY: HCV AB: NEGATIVE

## 2015-09-07 NOTE — Assessment & Plan Note (Signed)
Hx gestational diabetes but no knowledge of diabetes. Counseling today on etiology and complications. Talked to her about diet and she wants more information and referral for medical nutrition education. Checking HgA1c and goal <7. She would like to try lifestyle changes and working on weight loss instead of medications. Reminded about need for eye exam now and then yearly for monitoring of her diabetes for retinopathy. Given educational materials as well.

## 2015-09-07 NOTE — Progress Notes (Signed)
   Subjective:    Patient ID: Margaret [name], female    DOB: 03/01/1956, 60 y.o.   MRN: [id number]  HPI The patient is a 60 YO female coming in for new diagnosis of diabetes. She found this out after labs last time. She has several questions about diet and what this means for her. No numbness in her feet or hands. No recent eye exam. She is struggling with her weight and does not feel that she knows what to eat. Does not like taking meds and does not want to start any for the diabetes if she can work on lifestyle changes first. No new concerns. Does some walking.  Review of Systems  Constitutional: Negative for fever, activity change, fatigue and unexpected weight change.  Respiratory: Negative for cough, chest tightness, shortness of breath and wheezing.   Cardiovascular: Negative for chest pain, palpitations and leg swelling.  Gastrointestinal: Negative for nausea, abdominal pain, diarrhea, constipation and abdominal distention.  Endocrine: Negative for polydipsia, polyphagia and polyuria.  Musculoskeletal: Negative.   Skin: Negative.   Neurological: Negative.   Psychiatric/Behavioral: Positive for dysphoric mood. Negative for suicidal ideas, sleep disturbance and self-injury.      Objective:   Physical Exam  Constitutional: She is oriented to person, place, and time. She appears well-developed and well-nourished.  Obese  HENT:  Head: Normocephalic and atraumatic.  Eyes: EOM are normal.  Neck: Normal range of motion.  Cardiovascular: Normal rate and regular rhythm.   Pulmonary/Chest: Effort normal and breath sounds normal.  Abdominal: Soft. Bowel sounds are normal. She exhibits no distension. There is no tenderness. There is no rebound.  Musculoskeletal: She exhibits no edema.  Neurological: She is alert and oriented to person, place, and time.  Skin: Skin is warm and dry.  Psychiatric: She has a normal mood and affect.   Filed Vitals:   09/05/15 0847 09/05/15 0911  BP:  170/92 140/88  Pulse: 71   Temp: 98.8 F (37.1 C)   TempSrc: Oral   Resp: 18   Height: 5' 4.5" (1.638 m)   Weight: 236 lb (107.049 kg)   SpO2: 97%       Assessment & Plan:  Visit time 25 minutes: greater than 50% of that was spent face to face with patient counseling on her new disease state diabetes mellitus and diet and lifestyle changes that can be made as well as etiology and possible long term complications.

## 2015-09-07 NOTE — Assessment & Plan Note (Signed)
Weight is stable but not decreased and talked to her about the fact that this is affecting her diabetes.

## 2015-09-23 ENCOUNTER — Ambulatory Visit: Payer: BLUE CROSS/BLUE SHIELD

## 2015-09-30 ENCOUNTER — Ambulatory Visit: Payer: BLUE CROSS/BLUE SHIELD

## 2015-10-01 ENCOUNTER — Encounter: Payer: Self-pay | Admitting: *Deleted

## 2015-10-01 ENCOUNTER — Encounter: Payer: BLUE CROSS/BLUE SHIELD | Attending: Internal Medicine | Admitting: *Deleted

## 2015-10-01 DIAGNOSIS — E119 Type 2 diabetes mellitus without complications: Secondary | ICD-10-CM | POA: Diagnosis not present

## 2015-10-01 NOTE — Patient Instructions (Signed)
Plan:  Aim for 3 Carb Choices per meal (45 grams) +/- 1 either way  Aim for 0-2 Carbs per snack if hungry  Include protein in moderation with your meals and snacks Consider reading food labels for Total Carbohydrate of foods Continue with your activity level daily as tolerated Consider obtaining a blood glucose meter and checking BG at alternate times per day

## 2015-10-07 ENCOUNTER — Ambulatory Visit: Payer: BLUE CROSS/BLUE SHIELD

## 2015-10-07 NOTE — Progress Notes (Signed)
Diabetes Self-Management Education  Visit Type:    Appt. Start Time: 1400 Appt. End Time: [id number]  10/07/2015  Margaret [name], identified by name and date of birth, is a 60 y.o. female with a diagnosis of Diabetes:  .   ASSESSMENT  There were no vitals taken for this visit. There is no weight on file to calculate BMI. Patient declined      Diabetes Self-Management Education - 10/01/15 1404    Visit Information   Visit Type (p) First/Initial   Initial Visit   Diabetes Type (p) Type 2   Are you currently following a meal plan? (p) No   Are you taking your medications as prescribed? (p) Not on Medications   Date Diagnosed (p) 1 year ago (2016)   Health Coping   How would you rate your overall health? (p) Excellent   Psychosocial Assessment   Patient Belief/Attitude about Diabetes (p) Motivated to manage diabetes  and afraid regarding eating habits   Self-care barriers (p) None   Self-management support (p) Doctor's office;Friends;Family   Other persons present (p) Patient   Patient Concerns (p) Nutrition/Meal planning;Glycemic Control   Special Needs (p) None   Preferred Learning Style (p) Visual;Hands on   Learning Readiness (p) Ready   What is the last grade level you completed in school? (p) BFA, 4 year college   Complications   Last HgB A1C per patient/outside source (p) 7.1 %   How often do you check your blood sugar? (p) 0 times/day (not testing)   Have you had a dilated eye exam in the past 12 months? (p) Yes   Have you had a dental exam in the past 12 months? (p) Yes   Are you checking your feet? (p) Yes   How many days per week are you checking your feet? (p) 1   Dietary Intake   Breakfast (p) sweetened cereal with whole milk  OR toast, [place] bacon and 2 eggs   Snack (morning) (p) protein bar OR nuts and string cheese   Lunch (p) left overs OR salad OR sandwich and popcorn OR veggie patty on bun with popcorn   Snack (afternoon) (p) nothing   Dinner (p)  popcorn, then [employer] for dinner, then some chocolate pudding or candy   Snack (evening) (p) more grazing   Beverage(s) (p) water, diet soda, hot tea, sweetened iced tea occasionally with sweetener   Exercise   Exercise Type (p) Moderate (swimming / aerobic walking)  walks at lunch every day at work for 1 mile, swims for 40 minutes in pool as able or more walikng, plays tennis twice a week   How many days per week to you exercise? (p) 7   How many minutes per day do you exercise? (p) 45   Total minutes per week of exercise (p) 315   Patient Education   Previous Diabetes Education (p) No      Individualized Plan for Diabetes Self-Management Training:   Learning Objective:  Patient will have a greater understanding of diabetes self-management. Patient education plan is to attend individual and/or group sessions per assessed needs and concerns.   Plan:   Patient Instructions  Plan:  Aim for 3 Carb Choices per meal (45 grams) +/- 1 either way  Aim for 0-2 Carbs per snack if hungry  Include protein in moderation with your meals and snacks Consider reading food labels for Total Carbohydrate of foods Continue with your activity level daily as tolerated Consider obtaining a blood glucose  meter and checking BG at alternate times per day        Expected Outcomes:     Education material provided: Living Well with Diabetes, A1C conversion sheet, Meal plan card and Carbohydrate counting sheet  If problems or questions, patient to contact team via:  Phone and Email  Future DSME appointment:

## 2015-11-27 DIAGNOSIS — E119 Type 2 diabetes mellitus without complications: Secondary | ICD-10-CM | POA: Diagnosis not present

## 2015-11-27 LAB — HM DIABETES EYE EXAM

## 2016-01-05 ENCOUNTER — Other Ambulatory Visit: Payer: Self-pay | Admitting: Obstetrics & Gynecology

## 2016-01-05 DIAGNOSIS — Z01419 Encounter for gynecological examination (general) (routine) without abnormal findings: Secondary | ICD-10-CM | POA: Diagnosis not present

## 2016-01-05 DIAGNOSIS — Z1231 Encounter for screening mammogram for malignant neoplasm of breast: Secondary | ICD-10-CM | POA: Diagnosis not present

## 2016-01-05 DIAGNOSIS — Z124 Encounter for screening for malignant neoplasm of cervix: Secondary | ICD-10-CM | POA: Diagnosis not present

## 2016-01-05 DIAGNOSIS — Z6841 Body Mass Index (BMI) 40.0 and over, adult: Secondary | ICD-10-CM | POA: Diagnosis not present

## 2016-01-06 ENCOUNTER — Encounter: Payer: Self-pay | Admitting: Internal Medicine

## 2016-01-06 ENCOUNTER — Ambulatory Visit (INDEPENDENT_AMBULATORY_CARE_PROVIDER_SITE_OTHER): Payer: BLUE CROSS/BLUE SHIELD | Admitting: Internal Medicine

## 2016-01-06 ENCOUNTER — Other Ambulatory Visit (INDEPENDENT_AMBULATORY_CARE_PROVIDER_SITE_OTHER): Payer: BLUE CROSS/BLUE SHIELD

## 2016-01-06 VITALS — BP 124/78 | HR 72 | Temp 98.7°F | Resp 14 | Ht 66.0 in | Wt 245.0 lb

## 2016-01-06 DIAGNOSIS — E1169 Type 2 diabetes mellitus with other specified complication: Secondary | ICD-10-CM

## 2016-01-06 DIAGNOSIS — E119 Type 2 diabetes mellitus without complications: Secondary | ICD-10-CM | POA: Diagnosis not present

## 2016-01-06 DIAGNOSIS — Z23 Encounter for immunization: Secondary | ICD-10-CM | POA: Diagnosis not present

## 2016-01-06 DIAGNOSIS — E669 Obesity, unspecified: Secondary | ICD-10-CM | POA: Diagnosis not present

## 2016-01-06 LAB — HEMOGLOBIN A1C: Hgb A1c MFr Bld: 7.9 % — ABNORMAL HIGH (ref 4.6–6.5)

## 2016-01-06 LAB — COMPREHENSIVE METABOLIC PANEL
ALT: 23 U/L (ref 0–35)
AST: 18 U/L (ref 0–37)
Albumin: 3.5 g/dL (ref 3.5–5.2)
Alkaline Phosphatase: 81 U/L (ref 39–117)
BUN: 20 mg/dL (ref 6–23)
CALCIUM: 8.5 mg/dL (ref 8.4–10.5)
CHLORIDE: 106 meq/L (ref 96–112)
CO2: 30 meq/L (ref 19–32)
Creatinine, Ser: 0.86 mg/dL (ref 0.40–1.20)
GFR: 72.01 mL/min (ref >60.00)
Glucose, Bld: 243 mg/dL — ABNORMAL HIGH (ref 70–99)
Potassium: 4.3 mEq/L (ref 3.5–5.1)
Sodium: 139 mEq/L (ref 135–145)
Total Bilirubin: 0.3 mg/dL (ref 0.2–1.2)
Total Protein: 6.2 g/dL (ref 6.0–8.3)

## 2016-01-06 LAB — CYTOLOGY - PAP

## 2016-01-06 NOTE — Patient Instructions (Signed)
We have given you the pneumonia shot today.   We are checking the labs.   Consider trying to stop grazing at night time and get a puzzle book or something to do with your hands for entertainment that distracts you.  Diabetes and Standards of Medical Care Diabetes is complicated. You may find that your diabetes team includes a dietitian, nurse, diabetes educator, eye doctor, and more. To help everyone know what is going on and to help you get the care you deserve, the following schedule of care was developed to help keep you on track. Below are the tests, exams, vaccines, medicines, education, and plans you will need. HbA1c test This test shows how well you have controlled your glucose over the past 2-3 months. It is used to see if your diabetes management plan needs to be adjusted.   It is performed at least 2 times a year if you are meeting treatment goals.  It is performed 4 times a year if therapy has changed or if you are not meeting treatment goals. Blood pressure test  This test is performed at every routine medical visit. The goal is less than 140/90 mm Hg for most people, but 130/80 mm Hg in some cases. Ask your health care provider about your goal. Dental exam  Follow up with the dentist regularly. Eye exam  If you are diagnosed with type 1 diabetes as a child, get an exam upon reaching the age of 10 years or older and having had diabetes for 3-5 years. Yearly eye exams are recommended after that initial eye exam.  If you are diagnosed with type 1 diabetes as an adult, get an exam within 5 years of diagnosis and then yearly.  If you are diagnosed with type 2 diabetes, get an exam as soon as possible after the diagnosis and then yearly. Foot care exam  Visual foot exams are performed at every routine medical visit. The exams check for cuts, injuries, or other problems with the feet.  You should have a complete foot exam performed every year. This exam includes an inspection of  the structure and skin of your feet, a check of the pulses in your feet, and a check of the sensation in your feet.  Type 1 diabetes: The first exam is performed 5 years after diagnosis.  Type 2 diabetes: The first exam is performed at the time of diagnosis.  Check your feet nightly for cuts, injuries, or other problems with your feet. Tell your health care provider if anything is not healing. Kidney function test (urine microalbumin)  This test is performed once a year.  Type 1 diabetes: The first test is performed 5 years after diagnosis.  Type 2 diabetes: The first test is performed at the time of diagnosis.  A serum creatinine and estimated glomerular filtration rate (eGFR) test is done once a year to assess the level of chronic kidney disease (CKD), if present. Lipid profile (cholesterol, HDL, LDL, triglycerides)  Performed every 5 years for most people.  The goal for LDL is less than 100 mg/dL. If you are at high risk, the goal is less than 70 mg/dL.  The goal for HDL is 40 mg/dL-50 mg/dL for men and 50 mg/dL-60 mg/dL for women. An HDL cholesterol of 60 mg/dL or higher gives some protection against heart disease.  The goal for triglycerides is less than 150 mg/dL. Immunizations  The flu (influenza) vaccine is recommended yearly for every person 6 months of age or older who has   diabetes.  The pneumonia (pneumococcal) vaccine is recommended for every person 2 years of age or older who has diabetes. Adults 65 years of age or older may receive the pneumonia vaccine as a series of two separate shots.  The hepatitis B vaccine is recommended for adults shortly after they have been diagnosed with diabetes.  The Tdap (tetanus, diphtheria, and pertussis) vaccine should be given:  According to normal childhood vaccination schedules, for children.  Every 10 years, for adults who have diabetes. Diabetes self-management education  Education is recommended at diagnosis and ongoing as  needed. Treatment plan  Your treatment plan is reviewed at every medical visit.   This information is not intended to replace advice given to you by your health care provider. Make sure you discuss any questions you have with your health care provider.   Document Released: 01/31/2009 Document Revised: 04/26/2014 Document Reviewed: 09/05/2012 Elsevier Interactive Patient Education 2016 [employer]  

## 2016-01-06 NOTE — Progress Notes (Signed)
   Subjective:    Patient ID: Margaret [name], female    DOB: July 29, 1955, 60 y.o.   MRN: [id number]  HPI The patient is a 60 YO female coming in for follow up of her diabetes. She is controlled on diet for now and has been trying to avoid medications. She is exercising more and trying to lose some weight. She met with the nutritionist and did not feel like it helped a lot. She is grazing a lot between meals out of boredom. Watching her sugar in the morning with meter and they are around 160-170 sometimes. She has been watching which foods raise her sugar.   Review of Systems  Constitutional: Negative for activity change, fatigue, fever and unexpected weight change.  Respiratory: Negative for cough, chest tightness, shortness of breath and wheezing.   Cardiovascular: Negative for chest pain, palpitations and leg swelling.  Gastrointestinal: Negative for abdominal distention, abdominal pain, constipation, diarrhea and nausea.  Endocrine: Negative for polydipsia, polyphagia and polyuria.  Musculoskeletal: Negative.   Skin: Negative.   Neurological: Negative.       Objective:   Physical Exam  Constitutional: She is oriented to person, place, and time. She appears well-developed and well-nourished.  Obese  HENT:  Head: Normocephalic and atraumatic.  Eyes: EOM are normal.  Neck: Normal range of motion.  Cardiovascular: Normal rate and regular rhythm.   Pulmonary/Chest: Effort normal and breath sounds normal.  Abdominal: Soft. Bowel sounds are normal. She exhibits no distension. There is no tenderness. There is no rebound.  Neurological: She is alert and oriented to person, place, and time.  Skin: Skin is warm and dry.   Vitals:   01/06/16 0832  BP: 124/78  Pulse: 72  Resp: 14  Temp: 98.7 F (37.1 C)  TempSrc: Oral  SpO2: 97%  Weight: 245 lb (111.1 kg)  Height: _0  (1.676 m)      Assessment & Plan:  Pneumonia 23 given at visit.

## 2016-01-06 NOTE — Progress Notes (Signed)
Pre visit review using our clinic review tool, if applicable. No additional management support is needed unless otherwise documented below in the visit note. 

## 2016-01-07 ENCOUNTER — Telehealth: Payer: Self-pay | Admitting: Emergency Medicine

## 2016-01-07 NOTE — Telephone Encounter (Signed)
Left message for patient to call back to inform me if she would like Dr. Sharlet Salina to send in a medication for her sugars due to her recent lab results.

## 2016-01-07 NOTE — Assessment & Plan Note (Signed)
Checking HgA1c, CMP, foot exam done. Pneumonia 23 shot given at visit. She wants to avoid medications unless absolutely necessary. She has seen nutrition but is grazing a lot between meals. Have asked her to try to find another thing to do instead of eating like puzzles etc to stop grazing out of boredom.

## 2016-01-07 NOTE — Telephone Encounter (Signed)
Pt called and asked that you give her a call back. I asked what you could call her back about and all she would say was I just need to talk to her so have her give me a call. Thanks.

## 2016-01-12 ENCOUNTER — Encounter: Payer: Self-pay | Admitting: Internal Medicine

## 2016-01-22 DIAGNOSIS — Z23 Encounter for immunization: Secondary | ICD-10-CM | POA: Diagnosis not present

## 2016-01-29 ENCOUNTER — Encounter: Payer: Self-pay | Admitting: Internal Medicine

## 2016-01-29 DIAGNOSIS — E669 Obesity, unspecified: Principal | ICD-10-CM

## 2016-01-29 DIAGNOSIS — E1169 Type 2 diabetes mellitus with other specified complication: Secondary | ICD-10-CM

## 2016-02-04 MED ORDER — ALBUTEROL SULFATE HFA 108 (90 BASE) MCG/ACT IN AERS: 2.0000 | INHALATION_SPRAY | Freq: Four times a day (QID) | RESPIRATORY_TRACT | 0 refills | Status: DC | PRN

## 2016-02-04 NOTE — Addendum Note (Signed)
Addended by: Pricilla Holm A on: 02/04/2016 08:16 AM   Modules accepted: Orders

## 2016-06-07 ENCOUNTER — Ambulatory Visit (INDEPENDENT_AMBULATORY_CARE_PROVIDER_SITE_OTHER): Payer: BLUE CROSS/BLUE SHIELD | Admitting: Internal Medicine

## 2016-06-07 ENCOUNTER — Encounter: Payer: Self-pay | Admitting: Internal Medicine

## 2016-06-07 ENCOUNTER — Other Ambulatory Visit (INDEPENDENT_AMBULATORY_CARE_PROVIDER_SITE_OTHER): Payer: BLUE CROSS/BLUE SHIELD

## 2016-06-07 VITALS — BP 158/88 | HR 84 | Temp 98.5°F | Ht 66.0 in | Wt 244.0 lb

## 2016-06-07 DIAGNOSIS — E1169 Type 2 diabetes mellitus with other specified complication: Secondary | ICD-10-CM

## 2016-06-07 DIAGNOSIS — R030 Elevated blood-pressure reading, without diagnosis of hypertension: Secondary | ICD-10-CM | POA: Diagnosis not present

## 2016-06-07 DIAGNOSIS — E669 Obesity, unspecified: Secondary | ICD-10-CM

## 2016-06-07 LAB — HEMOGLOBIN A1C: HEMOGLOBIN A1C: 8.6 % — AB (ref 4.6–6.5)

## 2016-06-07 NOTE — Patient Instructions (Signed)
We will check the labs and send you the results.   Keep up the good work with the exercise.   If the sugars are still going up we probably should consider a low dose medicine to help the sugars.

## 2016-06-07 NOTE — Assessment & Plan Note (Signed)
BP is again high and she does not wish medication. Will monitor but if next time high she should be on medication.

## 2016-06-07 NOTE — Assessment & Plan Note (Signed)
She is having rising HgA1c and not able to successfully lose weight. Checking HgA1c and she knows that if it is still increasing we really should start medication and she is thinking about if she is willing. Some new burning pain which indicates the levels could be increasing.

## 2016-06-07 NOTE — Progress Notes (Signed)
   Subjective:    Patient ID: Margaret [name], female    DOB: 1955-07-08, 61 y.o.   MRN: [id number]  HPI The patient is a 61 YO female coming in for follow up of her diabetes. She is currently diet controlled and past sugar readings were going up some. Her weight has been going down for a little while then she regained that weight. She is not taking medication as she has been trying to avoid that. A lot of stress at work which is not helping with her diet. They are under some strict deadlines. She is having some new burning pains in her legs which are rare. She does not need medication for them. No cuts or sores or numbness in her feet.   Review of Systems  Constitutional: Positive for activity change, appetite change and fatigue. Negative for chills, fever and unexpected weight change.  HENT: Negative.   Eyes: Negative.   Respiratory: Negative for cough, chest tightness, shortness of breath and wheezing.   Cardiovascular: Negative.   Gastrointestinal: Negative.   Musculoskeletal:       Pain in her feet rarely  Neurological: Negative.       Objective:   Physical Exam  Constitutional: She is oriented to person, place, and time. She appears well-developed and well-nourished.  HENT:  Head: Normocephalic and atraumatic.  Eyes: EOM are normal.  Neck: Normal range of motion.  Cardiovascular: Normal rate and regular rhythm.   Pulmonary/Chest: Effort normal and breath sounds normal.  Abdominal: Soft. She exhibits no distension. There is no tenderness. There is no rebound.  Musculoskeletal: She exhibits no edema.  Neurological: She is alert and oriented to person, place, and time.  Skin: Skin is warm and dry.   Vitals:   06/07/16 0822 06/07/16 0902  BP: (!) 160/90 (!) 158/88  Pulse: 84   Temp: 98.5 F (36.9 C)   TempSrc: Oral   SpO2: 98%   Weight: 244 lb (110.7 kg)   Height: 5\' 6"  (1.676 m)       Assessment & Plan:

## 2016-06-07 NOTE — Progress Notes (Signed)
Pre visit review using our clinic review tool, if applicable. No additional management support is needed unless otherwise documented below in the visit note. 

## 2016-06-10 ENCOUNTER — Encounter: Payer: Self-pay | Admitting: Internal Medicine

## 2016-06-12 ENCOUNTER — Encounter: Payer: Self-pay | Admitting: Internal Medicine

## 2016-06-14 ENCOUNTER — Encounter: Payer: Self-pay | Admitting: Internal Medicine

## 2016-06-17 MED ORDER — METFORMIN HCL 500 MG PO TABS: 500.0000 mg | ORAL_TABLET | Freq: Two times a day (BID) | ORAL | 3 refills | Status: DC

## 2016-07-29 DIAGNOSIS — H43813 Vitreous degeneration, bilateral: Secondary | ICD-10-CM | POA: Diagnosis not present

## 2016-09-06 DIAGNOSIS — M25551 Pain in right hip: Secondary | ICD-10-CM | POA: Diagnosis not present

## 2016-11-22 ENCOUNTER — Ambulatory Visit: Payer: BLUE CROSS/BLUE SHIELD | Admitting: Nurse Practitioner

## 2016-12-02 ENCOUNTER — Encounter: Payer: Self-pay | Admitting: Nurse Practitioner

## 2016-12-02 ENCOUNTER — Other Ambulatory Visit (INDEPENDENT_AMBULATORY_CARE_PROVIDER_SITE_OTHER): Payer: BLUE CROSS/BLUE SHIELD

## 2016-12-02 ENCOUNTER — Ambulatory Visit (INDEPENDENT_AMBULATORY_CARE_PROVIDER_SITE_OTHER): Payer: BLUE CROSS/BLUE SHIELD | Admitting: Nurse Practitioner

## 2016-12-02 VITALS — BP 138/90 | HR 81 | Temp 98.5°F | Ht 66.0 in | Wt 235.0 lb

## 2016-12-02 DIAGNOSIS — E1169 Type 2 diabetes mellitus with other specified complication: Secondary | ICD-10-CM | POA: Diagnosis not present

## 2016-12-02 DIAGNOSIS — E669 Obesity, unspecified: Secondary | ICD-10-CM

## 2016-12-02 LAB — BASIC METABOLIC PANEL
BUN: 21 mg/dL (ref 6–23)
CALCIUM: 9.5 mg/dL (ref 8.4–10.5)
CHLORIDE: 103 meq/L (ref 96–112)
CO2: 29 meq/L (ref 19–32)
CREATININE: 1.06 mg/dL (ref 0.40–1.20)
GFR: 56.39 mL/min — ABNORMAL LOW (ref >60.00)
GLUCOSE: 162 mg/dL — AB (ref 70–99)
Potassium: 4.2 mEq/L (ref 3.5–5.1)
Sodium: 140 mEq/L (ref 135–145)

## 2016-12-02 LAB — MICROALBUMIN / CREATININE URINE RATIO
CREATININE, U: 613.1 mg/dL
Microalb Creat Ratio: 0.8 mg/g (ref 0.0–30.0)
Microalb, Ur: 4.9 mg/dL — ABNORMAL HIGH (ref 0.0–1.9)

## 2016-12-02 LAB — HEMOGLOBIN A1C: HEMOGLOBIN A1C: 7.5 % — AB (ref 4.6–6.5)

## 2016-12-02 MED ORDER — LISINOPRIL 2.5 MG PO TABS: 2.5000 mg | ORAL_TABLET | Freq: Every day | ORAL | 0 refills | Status: DC

## 2016-12-02 NOTE — Progress Notes (Signed)
Subjective:  Patient ID: Margaret [name], female    DOB: 24-Nov-1955  Age: 61 y.o. MRN: [id number]  CC: Follow-up (follow up A1C--metformin consult?)   Diabetes  She presents for her follow-up diabetic visit. She has type 2 diabetes mellitus. Her disease course has been stable. There are no hypoglycemic associated symptoms. Pertinent negatives for diabetes include no blurred vision, no fatigue, no foot paresthesias, no polydipsia, no polyphagia and no visual change. There are no diabetic complications. Risk factors for coronary artery disease include diabetes mellitus, obesity and post-menopausal. Her weight is decreasing steadily. She is following a diabetic and low fat/cholesterol diet. Meal planning includes avoidance of concentrated sweets. She has not had a previous visit with a dietitian. She participates in exercise daily. Home blood sugar record trend:  no check at home. An ACE inhibitor/angiotensin II receptor blocker is not being taken. She does not see a podiatrist.Eye exam is current.   developed diarrhea and nausea with metformin, which has noimproved  Exercise: Walking daily and playing tennis.  Outpatient Medications Prior to Visit  Medication Sig Dispense Refill  . albuterol (PROVENTIL HFA;VENTOLIN HFA) 108 (90 Base) MCG/ACT inhaler Inhale 2 puffs into the lungs every 6 (six) hours as needed for wheezing or shortness of breath. 1 Inhaler 0  . metFORMIN (GLUCOPHAGE) 500 MG tablet Take 1 tablet (500 mg total) by mouth 2 (two) times daily with a meal. 180 tablet 3  . naproxen sodium (ANAPROX) 220 MG tablet Take 220 mg by mouth 2 (two) times daily with a meal.      . vitamin B-12 (CYANOCOBALAMIN) 1000 MCG tablet Take 1,000 mcg by mouth daily. Reported on 10/01/2015     No facility-administered medications prior to visit.     ROS See HPI  Objective:  BP 138/90   Pulse 81   Temp 98.5 F (36.9 C)   Ht 5\' 6"  (1.676 m)   Wt 235 lb (106.6 kg)   SpO2 98%   BMI 37.93 kg/m    BP Readings from Last 3 Encounters:  12/02/16 138/90  06/07/16 (!) 158/88  01/06/16 124/78    Wt Readings from Last 3 Encounters:  12/02/16 235 lb (106.6 kg)  06/07/16 244 lb (110.7 kg)  01/06/16 245 lb (111.1 kg)    Physical Exam  Constitutional: She is oriented to person, place, and time. No distress.  Cardiovascular: Normal rate and normal heart sounds.   Pulmonary/Chest: Effort normal and breath sounds normal.  Abdominal: Soft. Bowel sounds are normal. She exhibits no distension. There is no tenderness.  Neurological: She is alert and oriented to person, place, and time.  Skin: Skin is warm and dry. No rash noted. No erythema.  Normal diabetic foot exam  Vitals reviewed.   Lab Results  Component Value Date   WBC 7.6 04/29/2011   HGB 15.5 (H) 04/29/2011   HCT 45.5 04/29/2011   PLT 260.0 04/29/2011   GLUCOSE 162 (H) 12/02/2016   CHOL 185 09/05/2015   TRIG 146.0 09/05/2015   HDL 52.10 09/05/2015   LDLCALC 104 (H) 09/05/2015   ALT 23 01/06/2016   AST 18 01/06/2016   NA 140 12/02/2016   K 4.2 12/02/2016   CL 103 12/02/2016   CREATININE 1.06 12/02/2016   BUN 21 12/02/2016   CO2 29 12/02/2016   TSH 1.65 09/05/2015   HGBA1C 7.5 (H) 12/02/2016   MICROALBUR 4.9 (H) 12/02/2016    US Breast Ltd Uni Right Inc Axilla  Result Date: 12/09/2014 CLINICAL DATA:  Short-term  follow-up for probable right breast abscess and annual exam of both breasts. EXAM: DIGITAL DIAGNOSTIC BILATERAL MAMMOGRAM WITH 3D TOMOSYNTHESIS WITH CAD ULTRASOUND RIGHT BREAST COMPARISON:  Multiple prior studies including most recent 11/13/2014 ACR Breast Density Category a: The breast tissue is almost entirely fatty. FINDINGS: No suspicious mass, distortion, or microcalcifications are identified to suggest presence of malignancy. Mammographic images were processed with CAD. On physical exam, there is no visible erythema or palpable mass in the lateral aspect of the right breast near the nipple. Patient  describes complete resolution of symptoms following completion of a course of antibiotics. Targeted ultrasound is performed, showing normal appearing fibroglandular tissue lateral to the right nipple. No residual mass or fluid collection identified. IMPRESSION: No mammographic or ultrasound evidence for malignancy. Interval resolution of right breast abscess. RECOMMENDATION: Screening mammogram in one year.(Code:SM-B-01Y) I have discussed the findings and recommendations with the patient. Results were also provided in writing at the conclusion of the visit. If applicable, a reminder letter will be sent to the patient regarding the next appointment. BI-RADS CATEGORY  1: Negative. Electronically Signed   By: Nolon Nations M.D.   On: 12/09/2014 09:04   Mm Diag Breast Tomo Bilateral  Result Date: 12/09/2014 CLINICAL DATA:  Short-term follow-up for probable right breast abscess and annual exam of both breasts. EXAM: DIGITAL DIAGNOSTIC BILATERAL MAMMOGRAM WITH 3D TOMOSYNTHESIS WITH CAD ULTRASOUND RIGHT BREAST COMPARISON:  Multiple prior studies including most recent 11/13/2014 ACR Breast Density Category a: The breast tissue is almost entirely fatty. FINDINGS: No suspicious mass, distortion, or microcalcifications are identified to suggest presence of malignancy. Mammographic images were processed with CAD. On physical exam, there is no visible erythema or palpable mass in the lateral aspect of the right breast near the nipple. Patient describes complete resolution of symptoms following completion of a course of antibiotics. Targeted ultrasound is performed, showing normal appearing fibroglandular tissue lateral to the right nipple. No residual mass or fluid collection identified. IMPRESSION: No mammographic or ultrasound evidence for malignancy. Interval resolution of right breast abscess. RECOMMENDATION: Screening mammogram in one year.(Code:SM-B-01Y) I have discussed the findings and recommendations with the  patient. Results were also provided in writing at the conclusion of the visit. If applicable, a reminder letter will be sent to the patient regarding the next appointment. BI-RADS CATEGORY  1: Negative. Electronically Signed   By: Nolon Nations M.D.   On: 12/09/2014 09:04    Assessment & Plan:   Kobie was seen today for follow-up.  Diagnoses and all orders for this visit:  Diabetes mellitus type 2 in obese ([place]) -     Hemoglobin A1c; Future -     Basic metabolic panel; Future -     Microalbumin / creatinine urine ratio; Future -     lisinopril (PRINIVIL,ZESTRIL) 2.5 MG tablet; Take 1 tablet (2.5 mg total) by mouth daily.   I am having Ms. Giron start on lisinopril. I am also having her maintain her naproxen sodium, vitamin B-12, albuterol, metFORMIN, Calcium-Mag-Vit C-Vit D, and ibuprofen.  Meds ordered this encounter  Medications  . Calcium-Mag-Vit C-Vit D 185-28-2.5-200 CAPS  . ibuprofen (ADVIL,MOTRIN) 800 MG tablet    Sig: TAKE 1 TABLET 2 TO 3 TIMES A DAY AS NEEDED    Refill:  1  . lisinopril (PRINIVIL,ZESTRIL) 2.5 MG tablet    Sig: Take 1 tablet (2.5 mg total) by mouth daily.    Dispense:  90 tablet    Refill:  0    Order Specific Question:  Supervising Provider    Answer:   Cassandria Anger [[id number]]    Follow-up: Return in about 6 months (around 06/04/2017) for with Dr. Sharlet Salina.  Wilfred Lacy, NP

## 2016-12-02 NOTE — Patient Instructions (Addendum)
Continue the great job with diet and exercise.  Mild improvement in hgb a1c. I will recommend maintaining current dose of metformin. Mild decline in renal function with positive urine microalbumine. I will also recommend adding low dose lisinopril to help provide renal protection from renal nephropathy due to diabetes  Diabetes Mellitus and Food It is important for you to manage your blood sugar (glucose) level. Your blood glucose level can be greatly affected by what you eat. Eating healthier foods in the appropriate amounts throughout the day at about the same time each day will help you control your blood glucose level. It can also help slow or prevent worsening of your diabetes mellitus. Healthy eating may even help you improve the level of your blood pressure and reach or maintain a healthy weight. General recommendations for healthful eating and cooking habits include:  Eating meals and snacks regularly. Avoid going long periods of time without eating to lose weight.  Eating a diet that consists mainly of plant-based foods, such as fruits, vegetables, nuts, legumes, and whole grains.  Using low-heat cooking methods, such as baking, instead of high-heat cooking methods, such as deep frying.  Work with your dietitian to make sure you understand how to use the Nutrition Facts information on food labels. How can food affect me? Carbohydrates Carbohydrates affect your blood glucose level more than any other type of food. Your dietitian will help you determine how many carbohydrates to eat at each meal and teach you how to count carbohydrates. Counting carbohydrates is important to keep your blood glucose at a healthy level, especially if you are using insulin or taking certain medicines for diabetes mellitus. Alcohol Alcohol can cause sudden decreases in blood glucose (hypoglycemia), especially if you use insulin or take certain medicines for diabetes mellitus. Hypoglycemia can be a  life-threatening condition. Symptoms of hypoglycemia (sleepiness, dizziness, and disorientation) are similar to symptoms of having too much alcohol. If your health care provider has given you approval to drink alcohol, do so in moderation and use the following guidelines:  Women should not have more than one drink per day, and men should not have more than two drinks per day. One drink is equal to: ? 12 oz of beer. ? 5 oz of wine. ? 1 oz of hard liquor.  Do not drink on an empty stomach.  Keep yourself hydrated. Have water, diet soda, or unsweetened iced tea.  Regular soda, juice, and other mixers might contain a lot of carbohydrates and should be counted.  What foods are not recommended? As you make food choices, it is important to remember that all foods are not the same. Some foods have fewer nutrients per serving than other foods, even though they might have the same number of calories or carbohydrates. It is difficult to get your body what it needs when you eat foods with fewer nutrients. Examples of foods that you should avoid that are high in calories and carbohydrates but low in nutrients include:  Trans fats (most processed foods list trans fats on the Nutrition Facts label).  Regular soda.  Juice.  Candy.  Sweets, such as cake, pie, doughnuts, and cookies.  Fried foods.  What foods can I eat? Eat nutrient-rich foods, which will nourish your body and keep you healthy. The food you should eat also will depend on several factors, including:  The calories you need.  The medicines you take.  Your weight.  Your blood glucose level.  Your blood pressure level.  Your cholesterol  level.  You should eat a variety of foods, including:  Protein. ? Lean cuts of meat. ? Proteins low in saturated fats, such as fish, egg whites, and beans. Avoid processed meats.  Fruits and vegetables. ? Fruits and vegetables that may help control blood glucose levels, such as apples,  mangoes, and yams.  Dairy products. ? Choose fat-free or low-fat dairy products, such as milk, yogurt, and cheese.  Grains, bread, pasta, and rice. ? Choose whole grain products, such as multigrain bread, whole oats, and brown rice. These foods may help control blood pressure.  Fats. ? Foods containing healthful fats, such as nuts, avocado, olive oil, canola oil, and fish.  Does everyone with diabetes mellitus have the same meal plan? Because every person with diabetes mellitus is different, there is not one meal plan that works for everyone. It is very important that you meet with a dietitian who will help you create a meal plan that is just right for you. This information is not intended to replace advice given to you by your health care provider. Make sure you discuss any questions you have with your health care provider. Document Released: 12/31/2004 Document Revised: 09/11/2015 Document Reviewed: 03/02/2013 Elsevier Interactive Patient Education  2017 [employer].

## 2016-12-03 ENCOUNTER — Encounter: Payer: Self-pay | Admitting: Nurse Practitioner

## 2016-12-24 ENCOUNTER — Encounter: Payer: Self-pay | Admitting: Nurse Practitioner

## 2016-12-24 ENCOUNTER — Other Ambulatory Visit (INDEPENDENT_AMBULATORY_CARE_PROVIDER_SITE_OTHER): Payer: BLUE CROSS/BLUE SHIELD

## 2016-12-24 ENCOUNTER — Ambulatory Visit (INDEPENDENT_AMBULATORY_CARE_PROVIDER_SITE_OTHER): Payer: BLUE CROSS/BLUE SHIELD | Admitting: Nurse Practitioner

## 2016-12-24 VITALS — BP 120/80 | HR 87 | Temp 99.2°F | Ht 66.0 in | Wt 237.0 lb

## 2016-12-24 DIAGNOSIS — R6883 Chills (without fever): Secondary | ICD-10-CM

## 2016-12-24 DIAGNOSIS — R195 Other fecal abnormalities: Secondary | ICD-10-CM | POA: Diagnosis not present

## 2016-12-24 DIAGNOSIS — K521 Toxic gastroenteritis and colitis: Secondary | ICD-10-CM

## 2016-12-24 LAB — CBC WITH DIFFERENTIAL/PLATELET
BASOS PCT: 1.1 % (ref 0.0–3.0)
Basophils Absolute: 0.1 10*3/uL (ref 0.0–0.1)
EOS PCT: 3.2 % (ref 0.0–5.0)
Eosinophils Absolute: 0.3 10*3/uL (ref 0.0–0.7)
HCT: 42.6 % (ref 36.0–46.0)
Hemoglobin: 14.5 g/dL (ref 12.0–15.0)
LYMPHS ABS: 1.9 10*3/uL (ref 0.7–4.0)
Lymphocytes Relative: 22.5 % (ref 12.0–46.0)
MCHC: 34.1 g/dL (ref 30.0–36.0)
MCV: 84.5 fl (ref 78.0–100.0)
MONO ABS: 0.4 10*3/uL (ref 0.1–1.0)
MONOS PCT: 4.4 % (ref 3.0–12.0)
NEUTROS ABS: 5.7 10*3/uL (ref 1.4–7.7)
NEUTROS PCT: 68.8 % (ref 43.0–77.0)
Platelets: 291 10*3/uL (ref 150.0–400.0)
RBC: 5.04 Mil/uL (ref 3.87–5.11)
RDW: 13.1 % (ref 11.5–15.5)
WBC: 8.3 10*3/uL (ref 4.0–10.5)

## 2016-12-24 LAB — HEPATIC FUNCTION PANEL
ALBUMIN: 3.9 g/dL (ref 3.5–5.2)
ALT: 30 U/L (ref 0–35)
AST: 23 U/L (ref 0–37)
Alkaline Phosphatase: 74 U/L (ref 39–117)
BILIRUBIN DIRECT: 0.1 mg/dL (ref 0.0–0.3)
TOTAL PROTEIN: 6.7 g/dL (ref 6.0–8.3)
Total Bilirubin: 0.3 mg/dL (ref 0.2–1.2)

## 2016-12-24 LAB — LIPASE: Lipase: 61 U/L — ABNORMAL HIGH (ref 11.0–59.0)

## 2016-12-24 LAB — AMYLASE: Amylase: 51 U/L (ref 27–131)

## 2016-12-24 NOTE — Patient Instructions (Addendum)
Need to rule out enteritis secondary to C-diff. Stable CBC. Mild elevation in lipase abut normal amylase. Pending Gi pathology.  Hold metformin for 2days  No oral abx or NSAIDs or aspirin till symptoms resolved  Go to basement for blood draw and specimen container.  Return stool to lab as soon as possible.  You also use pepto bismol as directed on package.  Do not use any imodium till stool sample is collected.  Push oral hydration with Pedialyte, water and/or Gatorade zero.  Food Choices to Help Relieve Diarrhea, Adult When you have diarrhea, the foods you eat and your eating habits are very important. Choosing the right foods and drinks can help:  Relieve diarrhea.  Replace lost fluids and nutrients.  Prevent dehydration.  What general guidelines should I follow? Relieving diarrhea  Choose foods with less than 2 g or .07 oz. of fiber per serving.  Limit fats to less than 8 tsp (38 g or 1.34 oz.) a day.  Avoid the following: ? Foods and beverages sweetened with high-fructose corn syrup, honey, or sugar alcohols such as xylitol, sorbitol, and mannitol. ? Foods that contain a lot of fat or sugar. ? Fried, greasy, or spicy foods. ? High-fiber grains, breads, and cereals. ? Raw fruits and vegetables.  Eat foods that are rich in probiotics. These foods include dairy products such as yogurt and fermented milk products. They help increase healthy bacteria in the stomach and intestines (gastrointestinal tract, or GI tract).  If you have lactose intolerance, avoid dairy products. These may make your diarrhea worse.  Take medicine to help stop diarrhea (antidiarrheal medicine) only as told by your health care provider. Replacing nutrients  Eat small meals or snacks every 3-4 hours.  Eat bland foods, such as white rice, toast, or baked potato, until your diarrhea starts to get better. Gradually reintroduce nutrient-rich foods as tolerated or as told by your health care  provider. This includes: ? Well-cooked protein foods. ? Peeled, seeded, and soft-cooked fruits and vegetables. ? Low-fat dairy products.  Take vitamin and mineral supplements as told by your health care provider. Preventing dehydration   Start by sipping water or a special solution to prevent dehydration (oral rehydration solution, ORS). Urine that is clear or pale yellow means that you are getting enough fluid.  Try to drink at least 8-10 cups of fluid each day to help replace lost fluids.  You may add other liquids in addition to water, such as clear juice or decaffeinated sports drinks, as tolerated or as told by your health care provider.  Avoid drinks with caffeine, such as coffee, tea, or soft drinks.  Avoid alcohol. What foods are recommended? The items listed may not be a complete list. Talk with your health care provider about what dietary choices are best for you. Grains White rice. White, [place], or pita breads (fresh or toasted), including plain rolls, buns, or bagels. White pasta. Saltine, soda, or graham crackers. Pretzels. Low-fiber cereal. Cooked cereals made with water (such as cornmeal, farina, or cream cereals). Plain muffins. Matzo. Melba toast. Zwieback. Vegetables Potatoes (without the skin). Most well-cooked and canned vegetables without skins or seeds. Tender lettuce. Fruits Apple sauce. Fruits canned in juice. Cooked apricots, cherries, grapefruit, peaches, pears, or plums. Fresh bananas and cantaloupe. Meats and other protein foods Baked or boiled chicken. Eggs. Tofu. Fish. Seafood. Smooth nut butters. Ground or well-cooked tender beef, ham, veal, lamb, pork, or poultry. Dairy Plain yogurt, kefir, and unsweetened liquid yogurt. Lactose-free milk, buttermilk, skim  milk, or soy milk. Low-fat or nonfat hard cheese. Beverages Water. Low-calorie sports drinks. Fruit juices without pulp. Strained tomato and vegetable juices. Decaffeinated teas. Sugar-free beverages  not sweetened with sugar alcohols. Oral rehydration solutions, if approved by your health care provider. Seasoning and other foods Bouillon, broth, or soups made from recommended foods. What foods are not recommended? The items listed may not be a complete list. Talk with your health care provider about what dietary choices are best for you. Grains Whole grain, whole wheat, bran, or rye breads, rolls, pastas, and crackers. Wild or brown rice. Whole grain or bran cereals. Barley. Oats and oatmeal. Corn tortillas or taco shells. Granola. Popcorn. Vegetables Raw vegetables. Fried vegetables. Cabbage, broccoli, Brussels sprouts, artichokes, baked beans, beet greens, corn, kale, legumes, peas, sweet potatoes, and yams. Potato skins. Cooked spinach and cabbage. Fruits Dried fruit, including raisins and dates. Raw fruits. Stewed or dried prunes. Canned fruits with syrup. Meat and other protein foods Fried or fatty meats. Deli meats. Chunky nut butters. Nuts and seeds. Beans and lentils. Berniece Salines. Hot dogs. Sausage. Dairy High-fat cheeses. Whole milk, chocolate milk, and beverages made with milk, such as milk shakes. Half-and-half. Cream. sour cream. Ice cream. Beverages Caffeinated beverages (such as coffee, tea, soda, or energy drinks). Alcoholic beverages. Fruit juices with pulp. Prune juice. Soft drinks sweetened with high-fructose corn syrup or sugar alcohols. High-calorie sports drinks. Fats and oils Butter. Cream sauces. Margarine. Salad oils. Plain salad dressings. Olives. Avocados. Mayonnaise. Sweets and desserts Sweet rolls, doughnuts, and sweet breads. Sugar-free desserts sweetened with sugar alcohols such as xylitol and sorbitol. Seasoning and other foods Honey. Hot sauce. Chili powder. Gravy. Cream-based or milk-based soups. Pancakes and waffles. Summary  When you have diarrhea, the foods you eat and your eating habits are very important.  Make sure you get at least 8-10 cups of fluid  each day, or enough to keep your urine clear or pale yellow.  Eat bland foods and gradually reintroduce healthy, nutrient-rich foods as tolerated, or as told by your health care provider.  Avoid high-fiber, fried, greasy, or spicy foods. This information is not intended to replace advice given to you by your health care provider. Make sure you discuss any questions you have with your health care provider. Document Released: 06/26/2003 Document Revised: 04/02/2016 Document Reviewed: 04/02/2016 Elsevier Interactive Patient Education  2017 [employer].

## 2016-12-24 NOTE — Progress Notes (Signed)
Subjective:  Patient ID: Margaret [name], female    DOB: 23-Jul-1955  Age: 61 y.o. MRN: [id number]  CC: GI Problem (watery stool,pale,pressure on right abdominal,nausea when eat, had dental work done and abx and high does of motin/ going on for 2 wks. )  Onset of symptoms while taking clindamycin and ibuprofen for dental procedure  GI Problem  The primary symptoms include fatigue, abdominal pain, nausea and diarrhea. Primary symptoms do not include fever, vomiting, melena, hematemesis, jaundice, hematochezia, dysuria, myalgias, arthralgias or rash. The illness began more than 7 days ago. The onset was gradual. The problem has been gradually worsening.  The illness is also significant for chills, anorexia and bloating. The illness does not include odynophagia, constipation, tenesmus, back pain or itching. Risk factors: recent clindamycin use and ibuprofen post dental procedure.  no undigested food, no mucus in stool. Symptoms not affected by food intake.  Outpatient Medications Prior to Visit  Medication Sig Dispense Refill  . albuterol (PROVENTIL HFA;VENTOLIN HFA) 108 (90 Base) MCG/ACT inhaler Inhale 2 puffs into the lungs every 6 (six) hours as needed for wheezing or shortness of breath. 1 Inhaler 0  . Calcium-Mag-Vit C-Vit D 185-28-2.5-200 CAPS     . lisinopril (PRINIVIL,ZESTRIL) 2.5 MG tablet Take 1 tablet (2.5 mg total) by mouth daily. 90 tablet 0  . metFORMIN (GLUCOPHAGE) 500 MG tablet Take 1 tablet (500 mg total) by mouth 2 (two) times daily with a meal. 180 tablet 3  . vitamin B-12 (CYANOCOBALAMIN) 1000 MCG tablet Take 1,000 mcg by mouth daily. Reported on 10/01/2015    . naproxen sodium (ANAPROX) 220 MG tablet Take 220 mg by mouth 2 (two) times daily with a meal.      . ibuprofen (ADVIL,MOTRIN) 800 MG tablet TAKE 1 TABLET 2 TO 3 TIMES A DAY AS NEEDED  1   No facility-administered medications prior to visit.     ROS See HPI  Objective:  BP 120/80   Pulse 87   Temp 99.2 F  (37.3 C)   Ht 5\' 6"  (1.676 m)   Wt 237 lb (107.5 kg)   SpO2 97%   BMI 38.25 kg/m   BP Readings from Last 3 Encounters:  12/24/16 120/80  12/02/16 138/90  06/07/16 (!) 158/88    Wt Readings from Last 3 Encounters:  12/24/16 237 lb (107.5 kg)  12/02/16 235 lb (106.6 kg)  06/07/16 244 lb (110.7 kg)    Physical Exam  Constitutional: She is oriented to person, place, and time. No distress.  Cardiovascular: Normal rate.   Pulmonary/Chest: Effort normal.  Abdominal: She exhibits no distension and no mass. There is tenderness. There is no rebound and no guarding.  Neurological: She is alert and oriented to person, place, and time.  Vitals reviewed.   Lab Results  Component Value Date   WBC 8.3 12/24/2016   HGB 14.5 12/24/2016   HCT 42.6 12/24/2016   PLT 291.0 12/24/2016   GLUCOSE 162 (H) 12/02/2016   CHOL 185 09/05/2015   TRIG 146.0 09/05/2015   HDL 52.10 09/05/2015   LDLCALC 104 (H) 09/05/2015   ALT 30 12/24/2016   AST 23 12/24/2016   NA 140 12/02/2016   K 4.2 12/02/2016   CL 103 12/02/2016   CREATININE 1.06 12/02/2016   BUN 21 12/02/2016   CO2 29 12/02/2016   TSH 1.65 09/05/2015   HGBA1C 7.5 (H) 12/02/2016   MICROALBUR 4.9 (H) 12/02/2016    US Breast Ltd Uni Right Inc Axilla  Result Date:  12/09/2014 CLINICAL DATA:  Short-term follow-up for probable right breast abscess and annual exam of both breasts. EXAM: DIGITAL DIAGNOSTIC BILATERAL MAMMOGRAM WITH 3D TOMOSYNTHESIS WITH CAD ULTRASOUND RIGHT BREAST COMPARISON:  Multiple prior studies including most recent 11/13/2014 ACR Breast Density Category a: The breast tissue is almost entirely fatty. FINDINGS: No suspicious mass, distortion, or microcalcifications are identified to suggest presence of malignancy. Mammographic images were processed with CAD. On physical exam, there is no visible erythema or palpable mass in the lateral aspect of the right breast near the nipple. Patient describes complete resolution of symptoms  following completion of a course of antibiotics. Targeted ultrasound is performed, showing normal appearing fibroglandular tissue lateral to the right nipple. No residual mass or fluid collection identified. IMPRESSION: No mammographic or ultrasound evidence for malignancy. Interval resolution of right breast abscess. RECOMMENDATION: Screening mammogram in one year.(Code:SM-B-01Y) I have discussed the findings and recommendations with the patient. Results were also provided in writing at the conclusion of the visit. If applicable, a reminder letter will be sent to the patient regarding the next appointment. BI-RADS CATEGORY  1: Negative. Electronically Signed   By: Nolon Nations M.D.   On: 12/09/2014 09:04   Mm Diag Breast Tomo Bilateral  Result Date: 12/09/2014 CLINICAL DATA:  Short-term follow-up for probable right breast abscess and annual exam of both breasts. EXAM: DIGITAL DIAGNOSTIC BILATERAL MAMMOGRAM WITH 3D TOMOSYNTHESIS WITH CAD ULTRASOUND RIGHT BREAST COMPARISON:  Multiple prior studies including most recent 11/13/2014 ACR Breast Density Category a: The breast tissue is almost entirely fatty. FINDINGS: No suspicious mass, distortion, or microcalcifications are identified to suggest presence of malignancy. Mammographic images were processed with CAD. On physical exam, there is no visible erythema or palpable mass in the lateral aspect of the right breast near the nipple. Patient describes complete resolution of symptoms following completion of a course of antibiotics. Targeted ultrasound is performed, showing normal appearing fibroglandular tissue lateral to the right nipple. No residual mass or fluid collection identified. IMPRESSION: No mammographic or ultrasound evidence for malignancy. Interval resolution of right breast abscess. RECOMMENDATION: Screening mammogram in one year.(Code:SM-B-01Y) I have discussed the findings and recommendations with the patient. Results were also provided in  writing at the conclusion of the visit. If applicable, a reminder letter will be sent to the patient regarding the next appointment. BI-RADS CATEGORY  1: Negative. Electronically Signed   By: Nolon Nations M.D.   On: 12/09/2014 09:04    Assessment & Plan:   Margaret [name] was seen today for gi problem.  Diagnoses and all orders for this visit:  Diarrhea due to drug -     CBC w/Diff; Future -     Lipase; Future -     Amylase; Future -     Hepatic function panel; Future -     Gastrointestinal Pathogen Panel PCR; Future  Chills without fever -     CBC w/Diff; Future -     Lipase; Future -     Amylase; Future -     Hepatic function panel; Future -     Gastrointestinal Pathogen Panel PCR; Future  Pale stool -     CBC w/Diff; Future -     Lipase; Future -     Amylase; Future -     Hepatic function panel; Future -     Gastrointestinal Pathogen Panel PCR; Future   I have discontinued Margaret [name]'s naproxen sodium and ibuprofen. I am also having her maintain her vitamin B-12, albuterol, metFORMIN, Calcium-Mag-Vit C-Vit  D, and lisinopril.  No orders of the defined types were placed in this encounter.   Follow-up: Return if symptoms worsen or fail to improve.  Wilfred Lacy, NP

## 2016-12-28 LAB — GASTROINTESTINAL PATHOGEN PANEL PCR
C. DIFFICILE TOX A/B, PCR: NOT DETECTED
CAMPYLOBACTER, PCR: NOT DETECTED
Cryptosporidium, PCR: NOT DETECTED
E coli (ETEC) LT/ST PCR: NOT DETECTED
E coli (STEC) stx1/stx2, PCR: NOT DETECTED
E coli 0157, PCR: NOT DETECTED
Giardia lamblia, PCR: NOT DETECTED
NOROVIRUS, PCR: NOT DETECTED
Rotavirus A, PCR: NOT DETECTED
SALMONELLA, PCR: NOT DETECTED
Shigella, PCR: NOT DETECTED

## 2016-12-29 ENCOUNTER — Encounter: Payer: Self-pay | Admitting: Nurse Practitioner

## 2017-01-17 DIAGNOSIS — Z01419 Encounter for gynecological examination (general) (routine) without abnormal findings: Secondary | ICD-10-CM | POA: Diagnosis not present

## 2017-01-17 DIAGNOSIS — Z1231 Encounter for screening mammogram for malignant neoplasm of breast: Secondary | ICD-10-CM | POA: Diagnosis not present

## 2017-01-17 DIAGNOSIS — Z6841 Body Mass Index (BMI) 40.0 and over, adult: Secondary | ICD-10-CM | POA: Diagnosis not present

## 2017-01-17 LAB — HM MAMMOGRAPHY

## 2017-01-20 DIAGNOSIS — Z23 Encounter for immunization: Secondary | ICD-10-CM | POA: Diagnosis not present

## 2017-01-25 ENCOUNTER — Encounter: Payer: Self-pay | Admitting: Internal Medicine

## 2017-02-21 NOTE — Progress Notes (Signed)
[name] [place], [place] [address] Phone: [phone] Subjective:    I'm seeing this patient by the request  of:  Hoyt Koch, MD   CC: Right hip pain  ELF:[id number]  Margaret [name] is a 61 y.o. female coming in with complaint of right hip pain. She was playing tennis when she twisted and felt a pop in her right hip. Saw a physician at [place] and was prescribed crutches and motrin. She feels it has not healed properly.   Onset- May Location- Lateral hip Duration- Worse in the morning Character- Sharp, stiff, dull, achy Aggravating factors- Sitting for long periods of time and then getting up, laying on the right hip  Reliving factors-  Therapies tried- Aleve (800 mg) Severity-     Past Medical History:  Diagnosis Date  . Arrhythmia    HR 38 on one occasion when she went to give blood. Holter monitor (12/11) showed HR 40's- 137, average 68, PAC's< several 5-10 beat runs of atrial tachycardia versus AVNRT  . Dyspnea    Echo (12/11) with EF 85%, normal diastolic function, normal RV, normal PA pressures, normal vales. Stress echo showed hypertensive BP response with noevidence for ischemia or infarction by echo or ECG  . Gestational diabetes   . Hx of colonic polyps    x3  . IBS (irritable bowel syndrome)   . Other specified cardiac dysrhythmias(427.89)   . Sleep apnea    lost weight withthe resolution of symptoms but has gain weight back   Past Surgical History:  Procedure Laterality Date  . CHOLECYSTECTOMY    . colonoscopy with polypectomy     > 3; [facility]  . PLANTAR FASCIA RELEASE     Social History   Socioeconomic History  . Marital status: Married    Spouse name: Not on file  . Number of children: Not on file  . Years of education: Not on file  . Highest education level: Not on file  Social Needs  . Financial resource strain: Not on file  . Food insecurity - worry: Not on file  . Food  insecurity - inability: Not on file  . Transportation needs - medical: Not on file  . Transportation needs - non-medical: Not on file  Occupational History  . Occupation: Engineer, manufacturing: [employer]  Tobacco Use  . Smoking status: Former Research scientist (life sciences)  . Smokeless tobacco: Never Used  . Tobacco comment: Smoked [age] ONLY   Substance and Sexual Activity  . Alcohol use: Yes    Comment: rarely  . Drug use: No  . Sexual activity: Not on file  Other Topics Concern  . Not on file  Social History Narrative  . Not on file   Allergies  Allergen Reactions  . Amoxicillin     Hives  . Penicillins     Hives  . Paroxetine     Headache , malaise   Family History  Problem Relation Age of Onset  . Colon polyps Father   . COPD Father   . Diabetes Father        diet controlled  . Heart attack Mother 20  . COPD Mother   . Lung cancer Mother   . Peripheral vascular disease Mother   . Hypertension Brother        2 bro  . Stroke Neg Hx      Past medical history, social, surgical and family  history all reviewed in electronic medical record.  No pertanent information unless stated regarding to the chief complaint.   Review of Systems:Review of systems updated and as accurate as of 02/21/17  No headache, visual changes, nausea, vomiting, diarrhea, constipation, dizziness, abdominal pain, skin rash, fevers, chills, night sweats, weight loss, swollen lymph nodes, body aches, joint swelling,  chest pain, shortness of breath, mood changes.  Positive muscle aches  Objective  There were no vitals taken for this visit. Systems examined below as of 02/21/17   General: No apparent distress alert and oriented x3 mood and affect normal, dressed appropriately.  HEENT: Pupils equal, extraocular movements intact  Respiratory: Patient's speak in full sentences and does not appear short of breath  Cardiovascular: No lower extremity edema, non tender, no erythema  Skin: Warm dry  intact with no signs of infection or rash on extremities or on axial skeleton.   Abdomen: Soft nontender  Neuro: Cranial nerves II through XII are intact, neurovascularly intact in all extremities with 2+ DTRs and 2+ pulses.  Lymph: No lymphadenopathy of posterior or anterior cervical chain or axillae bilaterally.  Gait normal with good balance and coordination.  MSK:  Non tender with full range of motion and good stability and symmetric strength and tone of shoulders, elbows, wrist, knee and ankles bilaterally.   GMW:NUUVO  ROM IR: 25 Deg, ER: 40 Deg, Flexion: 100 Deg, Extension: 80 Deg, Abduction: 45 Deg, Adduction: 45 Deg Strength IR: 5/5, ER: 5/5, Flexion: 5/5, Extension: 5/5, Abduction: 4/5, Adduction: 5/5 Pelvic alignment unremarkable to inspection and palpation. Standing hip rotation and gait without trendelenburg sign / unsteadiness. Greater trochanter without tenderness to palpation. Pain over the lateral aspect of the hip. Positive Faber anterolateral hip unremarkable No SI joint tenderness and normal minimal SI movement. Contralateral hip unremarkable  MSK US performed of: right hip  This study was ordered, performed, and interpreted by Charlann Boxer D.O.  Hip: Right hip exam shows the patient does have significant scar tissue at the insertion of the gluteal tendon.  Hypoechoic changes and increasing Doppler flow noted.  Mild reactive greater trochanteric bursitis noted IMPRESSION: Gluteal tendon tear with interval healing  [id number]; 15 additional minutes spent for Therapeutic exercises as stated in above notes.  This included exercises focusing on stretching, strengthening, with significant focus on eccentric aspects.   Long term goals include an improvement in range of motion, strength, endurance as well as avoiding reinjury. Patient's frequency would include in 1-2 times a day, 3-5 times a week for a duration of 6-12 weeks. Hip strengthening exercises which included:  Pelvic  tilt/bracing to help with proper recruitment of the lower abs and pelvic floor muscles  Glute strengthening to properly contract glutes without over-engaging low back and hamstrings - prone hip extension and glute bridge exercises Proper stretching techniques to increase effectiveness for the hip flexors, groin, quads, piriformic and low back when appropriate     Proper technique shown and discussed handout in great detail with ATC.  All questions were discussed and answered.     Impression and Recommendations:     This case required medical decision making of moderate complexity.      Note: This dictation was prepared with Dragon dictation along with smaller phrase technology. Any transcriptional errors that result from this process are unintentional.

## 2017-02-22 ENCOUNTER — Ambulatory Visit: Payer: Self-pay

## 2017-02-22 ENCOUNTER — Encounter: Payer: Self-pay | Admitting: Family Medicine

## 2017-02-22 ENCOUNTER — Ambulatory Visit: Payer: BLUE CROSS/BLUE SHIELD | Admitting: Family Medicine

## 2017-02-22 VITALS — BP 118/80 | HR 103 | Ht 64.0 in | Wt 237.0 lb

## 2017-02-22 DIAGNOSIS — S76011A Strain of muscle, fascia and tendon of right hip, initial encounter: Secondary | ICD-10-CM

## 2017-02-22 DIAGNOSIS — M25551 Pain in right hip: Secondary | ICD-10-CM

## 2017-02-22 MED ORDER — VITAMIN D (ERGOCALCIFEROL) 1.25 MG (50000 UNIT) PO CAPS: 50000.0000 [IU] | ORAL_CAPSULE | ORAL | 0 refills | Status: DC

## 2017-02-22 MED ORDER — DICLOFENAC SODIUM 2 % TD SOLN: 2.0000 "application " | Freq: Two times a day (BID) | TRANSDERMAL | 3 refills | Status: DC

## 2017-02-22 NOTE — Patient Instructions (Signed)
Good to see you  Ice 20 minutes 2 times daily. Usually after activity and before bed. pennsaid pinkie amount topically 2 times daily as needed.  Once weekly vitamin D for 12 weeks  Exercises 3 times a week.  PT will be calling you  See me again in 4 weeks

## 2017-02-22 NOTE — Assessment & Plan Note (Signed)
Tear noted.  Home exercises given, will send to formal physical therapy.  Discussed topical anti-inflammatories.  Patient given once weekly vitamin D.  Icing regimen given.  We discussed which activity during which wants to avoid.  Topical anti-inflammatories prescribed.  Follow-up again in 4 weeks.  Worsening symptoms we will consider injection.

## 2017-02-28 ENCOUNTER — Ambulatory Visit: Payer: BLUE CROSS/BLUE SHIELD | Attending: Family Medicine

## 2017-02-28 DIAGNOSIS — M6281 Muscle weakness (generalized): Secondary | ICD-10-CM | POA: Diagnosis not present

## 2017-02-28 DIAGNOSIS — M25551 Pain in right hip: Secondary | ICD-10-CM | POA: Diagnosis not present

## 2017-02-28 DIAGNOSIS — M25651 Stiffness of right hip, not elsewhere classified: Secondary | ICD-10-CM

## 2017-02-28 NOTE — Therapy (Signed)
[place], [place], [address] Phone: [phone]   Fax:  [phone]  Physical Therapy Evaluation  Patient Details  Name: Margaret [name] MRN: [id number] Date of Birth: 04/16/1956 Referring Provider: Hulan Saas DO   Encounter Date: 02/28/2017  PT End of Session - 02/28/17 0717    Visit Number  1    Number of Visits  12    Date for PT Re-Evaluation  04/08/17    Authorization Type  BCBS    PT Start Time  0705    PT Stop Time  0750    PT Time Calculation (min)  45 min    Activity Tolerance  Patient tolerated treatment well    Behavior During Therapy  [facility] for tasks assessed/performed       Past Medical History:  Diagnosis Date  . Arrhythmia    HR 38 on one occasion when she went to give blood. Holter monitor (12/11) showed HR 40's- 137, average 68, PAC's< several 5-10 beat runs of atrial tachycardia versus AVNRT  . Dyspnea    Echo (12/11) with EF 13%, normal diastolic function, normal RV, normal PA pressures, normal vales. Stress echo showed hypertensive BP response with noevidence for ischemia or infarction by echo or ECG  . Gestational diabetes   . Hx of colonic polyps    x3  . IBS (irritable bowel syndrome)   . Other specified cardiac dysrhythmias(427.89)   . Sleep apnea    lost weight withthe resolution of symptoms but has gain weight back    Past Surgical History:  Procedure Laterality Date  . CHOLECYSTECTOMY    . colonoscopy with polypectomy     > 3; [facility]  . PLANTAR FASCIA RELEASE      There were no vitals filed for this visit.   Subjective Assessment - 02/28/17 0709    Subjective  She reports playing tennis and in May began to have hip pain .   She report tear in gluteal muscle lateral RT hip.   She is doing some exercises.    she reports doing better since ijury and ahs not played since then  . She was turning LT with back hand and needed asssist off court.         Limitations  Walking not playing tennis, sore after getting out of chair.       How long can you sit comfortably?  As needed     How long can you stand comfortably?  As needed    How long can you walk comfortably?  as needed , pain eases after 5-10 min    Diagnostic tests  [place]: with scar tissue/bursitis and tear of gluteals.       Patient Stated Goals  She wants to get better and return to tennis    Currently in Pain?  No/denies    Pain Score  -- when in pain 5/10  first getting out from /bed or sitting     Pain Location  Hip    Pain Orientation  Right;Lateral    Pain Descriptors / Indicators  Sharp;Sore    Pain Type  Chronic pain    Pain Radiating Towards  some occasional pain into lower leg    Pain Onset  More than a month ago    Pain Frequency  Intermittent    Aggravating Factors   After sitting or lying or prolonged activity oin feet    Pain Relieving Factors  Moving , taking aleve  [facility] PT Assessment - 02/28/17 0001      Assessment   Medical Diagnosis  RT hip pain     Referring Provider  Hulan Saas DO    Onset Date/Surgical Date  -- 08/2016    Next MD Visit  03/21/17    Prior Therapy  No      Precautions   Precautions  None      Restrictions   Weight Bearing Restrictions  No      Balance Screen   Has the patient fallen in the past 6 months  No    Has the patient had a decrease in activity level because of a fear of falling?   -- limited by hip pain    Is the patient reluctant to leave their home because of a fear of falling?   No      Prior Function   Level of Independence  Independent      Cognition   Overall Cognitive Status  Within Functional Limits for tasks assessed      Observation/Other Assessments   Focus on Therapeutic Outcomes (FOTO)   38% limited      ROM / Strength   AROM / PROM / Strength  PROM;Strength      PROM   Overall PROM Comments  prone rotation 45 degrees RT / LT     PROM Assessment Site  Hip    Right/Left Hip  Right;Left     Right Hip External Rotation   45    Right Hip Internal Rotation   35    Left Hip Flexion  120 rt 120    Left Hip External Rotation   60    Left Hip Internal Rotation   33    Left Hip ADduction  30 rt 30      Strength   Overall Strength Comments  LE WNL Bilaterally  except 3+/5 abduction RT   4/5 Lt    and   4+/5  extension.       Flexibility   Soft Tissue Assessment /Muscle Length  yes    Hamstrings  WFL bilaterally             Objective measurements completed on examination: See above findings.              PT Education - 02/28/17 0757    Education provided  Yes    Education Details  POC , DN info, use of ball for STW    Person(s) Educated  Patient    Methods  Explanation;Demonstration;Tactile cues;Verbal cues;Handout    Comprehension  Verbalized understanding       PT Short Term Goals - 02/28/17 0752      PT SHORT TERM GOAL #1   Title  She will report decr paion 30 % or more with transitional movements    Time  3    Period  Weeks    Status  New      PT SHORT TERM GOAL #2   Title  She will be independent with initial HEP    Time  3    Period  Weeks    Status  New        PT Long Term Goals - 02/28/17 0753      PT LONG TERM GOAL #1   Title  She will be independent with all hEP issued    Time  6    Period  Weeks    Status  New      PT  LONG TERM GOAL #2   Title  She will report 75% decr pain with transitional movements.     Time  6    Period  Weeks    Status  New      PT LONG TERM GOAL #3   Title  she will improve RT hip abduction strength to 4/5    Time  6    Period  Weeks    Status  New      PT LONG TERM GOAL #4   Title  She will report able to walk 1 mile for exer  without increase pain.     Time  6    Period  Weeks    Status  New      PT LONG TERM GOAL #5   Title  she will be able to mimic tennis foot movement  with no pain to prepare for return to tennis.     Time  6    Period  Weeks    Status  New             Plan  - 02/28/17 0735    Clinical Impression Statement  Margaret [name] presents with complaint of chronic RT hip pain with weakness and increased pain after being static for a period.  Pain generally eases with activity but worsens if doing the activity for extended periods.   She is tender around trocantor and soft tissues in that area.  She walks with mild limp . she is weak in abductors  RT>LT    History and Personal Factors relevant to plan of care:  Historey of foot and knee issues on RT.     Clinical Presentation  Stable    Clinical Presentation due to:  injury of RT hip     Clinical Decision Making  Low    Rehab Potential  Good    PT Frequency  2x / week    PT Duration  6 weeks    PT Treatment/Interventions  Cryotherapy;Iontophoresis 4mg /ml Dexamethasone;Moist Heat;Ultrasound;Therapeutic exercise;Taping;Manual techniques;Dry needling;Patient/family education    PT Next Visit Plan  [facility] HEP . Modalities, STW,     PT Home Exercise Plan  use of ball for STW.     Consulted and Agree with Plan of Care  Patient       Patient will benefit from skilled therapeutic intervention in order to improve the following deficits and impairments:  Obesity, Decreased activity tolerance, Decreased range of motion, Increased muscle spasms, Pain, Decreased strength  Visit Diagnosis: Pain in right hip - Plan: PT plan of care cert/re-cert  Muscle weakness (generalized) - Plan: PT plan of care cert/re-cert  Stiffness of right hip, not elsewhere classified - Plan: PT plan of care cert/re-cert     Problem List Patient Active Problem List   Diagnosis Date Noted  . Tear of gluteus minimus tendon, right, initial encounter 02/22/2017  . Depression 04/08/2015  . Routine general medical examination at a health care facility 10/11/2014  . Sleep apnea 05/08/2012  . Rhinitis 05/08/2012  . Morbid obesity ([place]) 04/27/2011  . CARDIAC MURMUR 03/30/2010  . PAC (premature atrial contraction) 03/30/2010  . Diabetes  mellitus type 2 in obese ([place]) 02/01/2008  . ELEVATED BLOOD PRESSURE WITHOUT DIAGNOSIS OF HYPERTENSION 02/01/2008    Darrel Hoover  PT 02/28/2017, 8:00 AM  [facility] [address] [place], [place], [address] Phone: [phone]   Fax:  [phone]  Name: Margaret [name] MRN: [id number] Date of Birth:  02/24/1956  

## 2017-02-28 NOTE — Patient Instructions (Signed)
Issued instructions for use of ball for STW to RT hip.

## 2017-03-02 ENCOUNTER — Encounter: Payer: Self-pay | Admitting: Internal Medicine

## 2017-03-04 ENCOUNTER — Encounter: Payer: Self-pay | Admitting: Internal Medicine

## 2017-03-04 ENCOUNTER — Ambulatory Visit: Payer: BLUE CROSS/BLUE SHIELD | Admitting: Internal Medicine

## 2017-03-04 VITALS — BP 120/88 | HR 75 | Temp 99.3°F | Ht 64.0 in | Wt 234.0 lb

## 2017-03-04 DIAGNOSIS — E1169 Type 2 diabetes mellitus with other specified complication: Secondary | ICD-10-CM | POA: Diagnosis not present

## 2017-03-04 DIAGNOSIS — E669 Obesity, unspecified: Secondary | ICD-10-CM

## 2017-03-04 DIAGNOSIS — L272 Dermatitis due to ingested food: Secondary | ICD-10-CM | POA: Insufficient documentation

## 2017-03-04 MED ORDER — LISINOPRIL 2.5 MG PO TABS: 2.5000 mg | ORAL_TABLET | Freq: Every day | ORAL | 0 refills | Status: DC

## 2017-03-04 MED ORDER — EPINEPHRINE 0.3 MG/0.3ML IJ SOAJ: 0.3000 mg | Freq: Once | INTRAMUSCULAR | 1 refills | Status: AC

## 2017-03-04 NOTE — Assessment & Plan Note (Signed)
Advised to avoid all walnuts. Ordered food allergy panel to be done in about 3 months. Epi-pen prescribed and advised what to do if symptoms recur and to make a food journal to help elucidate the cause.

## 2017-03-04 NOTE — Patient Instructions (Signed)
We will have you avoid the walnuts for 2-3 months and come back for labs around mid February.  We have sent in an epi-pen to use if needed for food reaction.   Anaphylactic Reaction An anaphylactic reaction (anaphylaxis) is a sudden allergic reaction that is very bad (severe). It also affects more than one part of the body. This condition can be life-threatening. If you have an anaphylactic reaction, you need to get medical help right away. You may need to stay in the hospital. Your doctor may teach you how to use an allergy kit (anaphylaxis kit) and how to give yourself an allergy shot (epinephrine injection). You can give yourself an allergy shot with what is commonly called an auto-injector "pen." Symptoms of an anaphylactic reaction may include:  A stuffy nose (nasal congestion).  Headache.  Tingling in your mouth.  A flushed face.  An itchy, red rash.  Swelling of your eyes, lips, face, or tongue.  Swelling of the back of your mouth and your throat.  Breathing loudly (wheezing).  A hoarse voice.  Itchy, red, swollen areas of skin (hives).  Dizziness or light-headedness.  Passing out (fainting).  Feeling worried or nervous (anxiety).  Feeling confused.  Pain in your belly (abdomen) or chest.  Trouble with breathing, talking, or swallowing.  A tight feeling in your chest or throat.  Fast or uneven heartbeats (palpitations).  Throwing up (vomiting).  Watery poop (diarrhea).  Follow these instructions at home: Safety  Always keep an auto-injector pen or your allergy kit with you. These could save your life. Use them as told by your doctor.  Do not drive until your doctor says that it is safe.  Make sure that you, the people who live with you, and your employer know: ? How to use your allergy kit. ? How to use an auto-injector pen to give you an allergy shot.  If you used your auto-injector pen: ? Get more medicine for it right away. This is important in  case you have another reaction. ? Get help right away.  Wear a bracelet or necklace that says you have an allergy, if your doctor tells you to do this.  Learn the signs of a very bad allergic reaction.  Work with your doctors to make a plan for what to do if you have a very bad allergic reaction. Being prepared is important. General instructions  Take over-the-counter and prescription medicines only as told by your doctor.  If you have itchy, red, swollen areas of skin or a rash: ? Use over-the-counter medicine (antihistamine) as told by your doctor. ? Put cold, wet cloths (cold compresses) on your skin. ? Take baths or showers in cool water. Avoid hot water.  If you had tests done, it is up to you to get your test results. Ask your doctor when your results will be ready.  Tell any doctors who care for you that you have an allergy.  Keep all follow-up visits as told by your doctor. This is important. How is this prevented?  Avoid things (allergens) that gave you a very bad allergic reaction before.  If you have a food allergy and you go to a restaurant, tell your server about your allergy. If you are not sure if your meal was made with food that you are allergic to, ask your server before you eat it. Contact a doctor if:  You have symptoms of an allergic reaction. You may notice them soon after being around whatever it is  that you are allergic to. Symptoms may include: ? A rash. ? A headache. ? Sneezing or a runny nose. ? Swelling. ? Feeling sick to your stomach. ? Watery poop. Get help right away if:  You had to use your auto-injector pen. You must go to the emergency room even if the medicine seems to be working.  You have any of these: ? A tight feeling in your chest or your throat. ? Loud breathing. ? Trouble with breathing. ? Itchy, red, swollen areas of skin. ? Red skin or itching all over your body. ? Swelling in your lips, tongue, or the back of your  throat.  You have throwing up that gets very bad.  You have watery poop that gets very bad.  You pass out or feel like you might pass out. These symptoms may be an emergency. Do not wait to see if the symptoms will go away. Use your auto-injector pen or allergy kit as you have been told. Get medical help right away. Call your local emergency services (911 in the [place] Do not drive yourself to the hospital. Summary  An anaphylactic reaction (anaphylaxis) is a sudden allergic reaction that is very bad (severe).  This condition can be life-threatening. If you have an anaphylactic reaction, you need to get medical help right away.  Your doctor may teach you how to use an allergy kit (anaphylaxis kit) and how to give yourself an allergy shot (epinephrine injection) with an auto-injector "pen."  Always keep an auto-injector pen or your allergy kit with you. These could save your life. Use them as told by your doctor.  If you had to use your auto-injector pen, you must go to the emergency room even if the medicine seems to be working. This information is not intended to replace advice given to you by your health care provider. Make sure you discuss any questions you have with your health care provider. Document Released: 09/22/2007 Document Revised: 11/28/2015 Document Reviewed: 11/28/2015 Elsevier Interactive Patient Education  2017 [employer].

## 2017-03-04 NOTE — Progress Notes (Signed)
   Subjective:    Patient ID: Margaret [name], female    DOB: 07-08-1955, 61 y.o.   MRN: [id number]  HPI The patient is a 61 YO female coming in for possible food allergy. She was eating a different brand of walnuts when she started having a reaction. Abdominal pain and bloating. Acid reflux and throat felt like it was swelling. Some rash as well. She drank some water and this helped mildly. She did not seek care. She has not eaten walnuts since. She denies recurrent symptoms.   Review of Systems  Constitutional: Negative.   HENT: Positive for congestion, facial swelling, sore throat and trouble swallowing.   Eyes: Negative.   Respiratory: Negative for cough, chest tightness and shortness of breath.   Cardiovascular: Negative for chest pain, palpitations and leg swelling.  Gastrointestinal: Positive for abdominal distention, abdominal pain and nausea. Negative for constipation, diarrhea and vomiting.  Musculoskeletal: Negative.   Skin: Negative.       Objective:   Physical Exam  Constitutional: She is oriented to person, place, and time. She appears well-developed and well-nourished.  HENT:  Head: Normocephalic and atraumatic.  Eyes: EOM are normal.  Neck: Normal range of motion.  Cardiovascular: Normal rate and regular rhythm.  Pulmonary/Chest: Effort normal and breath sounds normal. No respiratory distress. She has no wheezes. She has no rales.  Abdominal: Soft. Bowel sounds are normal. She exhibits no distension. There is no tenderness. There is no rebound.  Musculoskeletal: She exhibits no edema.  Neurological: She is alert and oriented to person, place, and time. Coordination normal.  Skin: Skin is warm and dry.  Psychiatric: She has a normal mood and affect.   Vitals:   03/04/17 0843  BP: 120/88  Pulse: 75  Temp: 99.3 F (37.4 C)  TempSrc: Oral  SpO2: 97%  Weight: 234 lb (106.1 kg)  Height: 5\' 4"  (1.626 m)      Assessment & Plan:

## 2017-03-07 ENCOUNTER — Ambulatory Visit: Payer: BLUE CROSS/BLUE SHIELD

## 2017-03-07 DIAGNOSIS — M25551 Pain in right hip: Secondary | ICD-10-CM

## 2017-03-07 DIAGNOSIS — M6281 Muscle weakness (generalized): Secondary | ICD-10-CM

## 2017-03-07 DIAGNOSIS — M25651 Stiffness of right hip, not elsewhere classified: Secondary | ICD-10-CM

## 2017-03-07 NOTE — Therapy (Signed)
[place], [place], [address] Phone: [phone]   Fax:  [phone]  Physical Therapy Treatment  Patient Details  Name: Margaret [name] MRN: [id number] Date of Birth: [birth year] Referring Provider: Hulan Saas DO   Encounter Date: 03/07/2017  PT End of Session - 03/07/17 0701    Visit Number  2    Number of Visits  12    Date for PT Re-Evaluation  04/08/17    Authorization Type  BCBS    PT Start Time  0700    PT Stop Time  0740    PT Time Calculation (min)  40 min    Activity Tolerance  Patient tolerated treatment well    Behavior During Therapy  [facility] for tasks assessed/performed       Past Medical History:  Diagnosis Date  . Arrhythmia    HR 38 on one occasion when she went to give blood. Holter monitor (12/11) showed HR 40's- 137, average 68, PAC's< several 5-10 beat runs of atrial tachycardia versus AVNRT  . Dyspnea    Echo (12/11) with EF 81%, normal diastolic function, normal RV, normal PA pressures, normal vales. Stress echo showed hypertensive BP response with noevidence for ischemia or infarction by echo or ECG  . Gestational diabetes   . Hx of colonic polyps    x3  . IBS (irritable bowel syndrome)   . Other specified cardiac dysrhythmias(427.89)   . Sleep apnea    lost weight withthe resolution of symptoms but has gain weight back    Past Surgical History:  Procedure Laterality Date  . CHOLECYSTECTOMY    . colonoscopy with polypectomy     > 3; [facility]  . PLANTAR FASCIA RELEASE      There were no vitals filed for this visit.  Subjective Assessment - 03/07/17 0705    Subjective  Alot of pain this weekend. Pain worse after doing exercises at end of last week. After that just walked.       Currently in Pain?  No/denies                      [facility] Adult PT Treatment/Exercise - 03/07/17 0001      Knee/Hip Exercises: Stretches   Other Knee/Hip Stretches  All HEP  was reviewed and modified with decr reps , decreased intensity with pulling stretches. Asked to move exer to AM.  Added shoulder bridge, fig 4 stretch to chest, standing DF and hip flexor stretching      Knee/Hip Exercises: Aerobic   Recumbent Bike  L3 5 min      Manual Therapy   Manual Therapy  Joint mobilization;Manual Traction    Joint Mobilization  Posterior glides Gr 3-4 x 75 reps    Manual Traction  RT hip Gr 3-4 150 reps              PT Education - 03/07/17 0742    Education provided  Yes    Education Details  Reviewed all HEP and instructed in stabilization of pelvis and decr reps and intensity of stretching and strength exer. Added pull RT leg from fig 4 position  and option to hip flexor streching  in standing and from chair.  and posterior tilt with bridge.     Person(s) Educated  Patient    Methods  Explanation;Demonstration;Tactile cues;Verbal cues;Handout    Comprehension  Verbalized understanding;Returned demonstration       PT Short Term Goals -  02/28/17 0752      PT SHORT TERM GOAL #1   Title  She will report decr paion 30 % or more with transitional movements    Time  3    Period  Weeks    Status  New      PT SHORT TERM GOAL #2   Title  She will be independent with initial HEP    Time  3    Period  Weeks    Status  New        PT Long Term Goals - 02/28/17 0753      PT LONG TERM GOAL #1   Title  She will be independent with all hEP issued    Time  6    Period  Weeks    Status  New      PT LONG TERM GOAL #2   Title  She will report 75% decr pain with transitional movements.     Time  6    Period  Weeks    Status  New      PT LONG TERM GOAL #3   Title  she will improve RT hip abduction strength to 4/5    Time  6    Period  Weeks    Status  New      PT LONG TERM GOAL #4   Title  She will report able to walk 1 mile for exer  without increase pain.     Time  6    Period  Weeks    Status  New      PT LONG TERM GOAL #5   Title  she will  be able to mimic tennis foot movement  with no pain to prepare for return to tennis.     Time  6    Period  Weeks    Status  New            Plan - 03/07/17 0701    Clinical Impression Statement  No changes since eval .   No pain post session . HEP modified . She will do them in AM and decr reps.      PT Treatment/Interventions  Cryotherapy;Iontophoresis 4mg /ml Dexamethasone;Moist Heat;Ultrasound;Therapeutic exercise;Taping;Manual techniques;Dry needling;Patient/family education    PT Next Visit Plan  [place]view HEP . Modalities, STW, mobs.     PT Home Exercise Plan  use of ball for STW.     Consulted and Agree with Plan of Care  Patient       Patient will benefit from skilled therapeutic intervention in order to improve the following deficits and impairments:  Obesity, Decreased activity tolerance, Decreased range of motion, Increased muscle spasms, Pain, Decreased strength  Visit Diagnosis: Pain in right hip  Muscle weakness (generalized)  Stiffness of right hip, not elsewhere classified     Problem List Patient Active Problem List   Diagnosis Date Noted  . Food allergic skin reaction 03/04/2017  . Tear of gluteus minimus tendon, right, initial encounter 02/22/2017  . Depression 04/08/2015  . Routine general medical examination at a health care facility 10/11/2014  . Sleep apnea 05/08/2012  . Rhinitis 05/08/2012  . Morbid obesity ([place]) 04/27/2011  . CARDIAC MURMUR 03/30/2010  . PAC (premature atrial contraction) 03/30/2010  . Diabetes mellitus type 2 in obese ([place]) 02/01/2008  . ELEVATED BLOOD PRESSURE WITHOUT DIAGNOSIS OF HYPERTENSION 02/01/2008    Margaret [name]  PT 03/07/2017, 7:53 AM  [place],  [place], [address] Phone: [phone]   Fax:  [phone]  Name: Margaret [name] MRN: [id number] Date of Birth: 1955-10-18

## 2017-03-07 NOTE — Patient Instructions (Signed)
From cabinet shoulder bridge, fig 4 pull Rt hip.   Hip flexors stretch options.  All 30-45 sec with stretches and 10 reps with strength exer. stretch 1x/day,

## 2017-03-14 ENCOUNTER — Ambulatory Visit: Payer: BLUE CROSS/BLUE SHIELD

## 2017-03-14 DIAGNOSIS — M25651 Stiffness of right hip, not elsewhere classified: Secondary | ICD-10-CM | POA: Diagnosis not present

## 2017-03-14 DIAGNOSIS — M6281 Muscle weakness (generalized): Secondary | ICD-10-CM | POA: Diagnosis not present

## 2017-03-14 DIAGNOSIS — M25551 Pain in right hip: Secondary | ICD-10-CM

## 2017-03-14 NOTE — Patient Instructions (Addendum)
Gymball: Bridging (Single Leg)  B  Lie on back, calves on ball. With left leg vertical, slowly raise and lower buttocks. Repeat __10-15_ times. Repeat with other leg for set. Rest ___ seconds after set. Do _1__ sets per session. No ball , support leg bent, lifted leg bent.    http://plyo.exer.us/166  Hip Extension: Hamstring Single Leg Deadlift (Eccentric)   Holding weights, stand on affected leg with knee slightly flexed. Lift other leg while slowly bending forward at the hip. Use ___ lb weight. ___ reps per set, ___ sets per day, ___ days per week. Add ___ lbs when you achieve ___ repetitions. Touch floor with weight.  Copyright  VHI. All rights reserved.  Squat: Wide Leg   Feet wide apart, toes out, squat, holding weight between knees. Weight should be at ankles.  Bend at the hips with hips going back behind hips. Keep back straight.  Put eight on stool if you cannot get to floor.  Repeat _3-15___ times per set. Do ___1-2_ sets per session. Do __2-5__ sessions per week. Use __0-10__ lb weight.  Copyright  VHI. All rights reserved.  Squat: Half   Arms hanging at sides, squat by dropping hips back as if sitting on a chair. Keep knees over ankles, hips behind heels.  Keep back straight.  Bend at hips and knees will bend with hips.   Repeat ___3-15_ times per set. Do __1-2__ sets per session. Do __2-5__ sessions per week. Use __0-10HIP / KNEE: Flexion / Extension, Squat Unilateral Hip / Glute Extension: Standing - Straight Leg (Machine) Hip Extension (Standing) Healthy Back - Yoga Tree Balance Copyright  VHI. All rights reserved.      Bridge with ball and band adduct and abduct x 10-15 reps with PPT

## 2017-03-14 NOTE — Therapy (Signed)
[place], [place], [address] Phone: [phone]   Fax:  [phone]  Physical Therapy Treatment  Patient Details  Name: Margaret [name] MRN: [id number] Date of Birth: [birth year] Referring Provider: Hulan Saas DO   Encounter Date: 03/14/2017  PT End of Session - 03/14/17 0708    Visit Number  3    Number of Visits  12    Date for PT Re-Evaluation  04/08/17    Authorization Type  BCBS    PT Start Time  0704    PT Stop Time  0745    PT Time Calculation (min)  41 min    Activity Tolerance  Patient tolerated treatment well    Behavior During Therapy  [facility] for tasks assessed/performed       Past Medical History:  Diagnosis Date  . Arrhythmia    HR 38 on one occasion when she went to give blood. Holter monitor (12/11) showed HR 40's- 137, average 68, PAC's< several 5-10 beat runs of atrial tachycardia versus AVNRT  . Dyspnea    Echo (12/11) with EF 30%, normal diastolic function, normal RV, normal PA pressures, normal vales. Stress echo showed hypertensive BP response with noevidence for ischemia or infarction by echo or ECG  . Gestational diabetes   . Hx of colonic polyps    x3  . IBS (irritable bowel syndrome)   . Other specified cardiac dysrhythmias(427.89)   . Sleep apnea    lost weight withthe resolution of symptoms but has gain weight back    Past Surgical History:  Procedure Laterality Date  . CHOLECYSTECTOMY    . colonoscopy with polypectomy     > 3; [facility]  . PLANTAR FASCIA RELEASE      There were no vitals filed for this visit.  Subjective Assessment - 03/14/17 0706    Subjective  No pain now. walked 5K . Pain no worse post this activity. Last pain yesterday with a little soreness. Feels out of wack after sitting but walking straightens things out., Was able to do the stretches with modifications  without pain    Currently in Pain?  No/denies                       [facility] Adult PT Treatment/Exercise - 03/14/17 0001      Knee/Hip Exercises: Stretches   Piriformis Stretch  Right;Left;30 seconds;2 reps    Other Knee/Hip Stretches  fig 4 ER stretch , knee tochest RT/LT 2x 30 sec      Knee/Hip Exercises: Aerobic   Recumbent Bike  L3 5 min      Knee/Hip Exercises: Standing   Other Standing Knee Exercises  single and double  leg hip hinge x 8 reps add HEP      Knee/Hip Exercises: Supine   Bridges with Ball Squeeze  Both;15 reps    Bridges with Clamshell  Both;15 reps green band    Single Leg Bridge  Right;Left;10 reps cue to flex non support hip into flexion      Knee/Hip Exercises: Sidelying   Hip ABduction  Right;Left green band x 12    Clams  green band x 12       Manual Therapy   Manual Traction  RT hip Gr 4 125 reps              PT Education - 03/14/17 0733    Education provided  Yes    Education Details  hip hinge, single leg and sit to stand , single leg bridge    Person(s) Educated  Patient    Methods  Explanation;Demonstration;Verbal cues;Handout    Comprehension  Returned demonstration;Verbalized understanding       PT Short Term Goals - 03/14/17 0708      PT SHORT TERM GOAL #1   Title  She will report decr paion 30 % or more with transitional movements    Status  On-going      PT SHORT TERM GOAL #2   Title  She will be independent with initial HEP    Status  Achieved        PT Long Term Goals - 02/28/17 0753      PT LONG TERM GOAL #1   Title  She will be independent with all hEP issued    Time  6    Period  Weeks    Status  New      PT LONG TERM GOAL #2   Title  She will report 75% decr pain with transitional movements.     Time  6    Period  Weeks    Status  New      PT LONG TERM GOAL #3   Title  she will improve RT hip abduction strength to 4/5    Time  6    Period  Weeks    Status  New      PT LONG TERM GOAL #4   Title  She will report able to walk 1 mile for  exer  without increase pain.     Time  6    Period  Weeks    Status  New      PT LONG TERM GOAL #5   Title  she will be able to mimic tennis foot movement  with no pain to prepare for return to tennis.     Time  6    Period  Weeks    Status  New            Plan - 03/14/17 0708    Clinical Impression Statement  Improved stretching and tolerance with walking as did 5 K walking this weekend she said she felt stronger. No pain post  exercise today. cued pt to limit reps if fatigued or sore      PT Treatment/Interventions  Cryotherapy;Iontophoresis 4mg /ml Dexamethasone;Moist Heat;Ultrasound;Therapeutic exercise;Taping;Manual techniques;Dry needling;Patient/family education    PT Next Visit Plan   Strength Modalities, STW, mobs.     PT Home Exercise Plan  use of ball for STW. Stretching RT hip clam and SLR sideying , shoulder bridge. Ball squeeze and clam with band.  single and double leg hip hinge    Consulted and Agree with Plan of Care  Patient       Patient will benefit from skilled therapeutic intervention in order to improve the following deficits and impairments:  Obesity, Decreased activity tolerance, Decreased range of motion, Increased muscle spasms, Pain, Decreased strength  Visit Diagnosis: Pain in right hip  Muscle weakness (generalized)  Stiffness of right hip, not elsewhere classified     Problem List Patient Active Problem List   Diagnosis Date Noted  . Food allergic skin reaction 03/04/2017  . Tear of gluteus minimus tendon, right, initial encounter 02/22/2017  . Depression 04/08/2015  . Routine general medical examination at a health care facility 10/11/2014  . Sleep apnea 05/08/2012  . Rhinitis 05/08/2012  . Morbid obesity ([place]) 04/27/2011  . CARDIAC MURMUR  03/30/2010  . PAC (premature atrial contraction) 03/30/2010  . Diabetes mellitus type 2 in obese ([place]) 02/01/2008  . ELEVATED BLOOD PRESSURE WITHOUT DIAGNOSIS OF HYPERTENSION 02/01/2008     Darrel Hoover  PT 03/14/2017, 7:53 AM  [facility] [address] [place], [place], [address] Phone: [phone]   Fax:  [phone]  Name: SHAKTI FLEER MRN: [id number] Date of Birth: 1955/07/04

## 2017-03-16 ENCOUNTER — Encounter: Payer: Self-pay | Admitting: Internal Medicine

## 2017-03-16 MED ORDER — METFORMIN HCL ER 500 MG PO TB24: 500.0000 mg | ORAL_TABLET | Freq: Every day | ORAL | 1 refills | Status: DC

## 2017-03-18 ENCOUNTER — Ambulatory Visit: Payer: BLUE CROSS/BLUE SHIELD | Admitting: Physical Therapy

## 2017-03-18 ENCOUNTER — Encounter: Payer: Self-pay | Admitting: Physical Therapy

## 2017-03-18 DIAGNOSIS — M25551 Pain in right hip: Secondary | ICD-10-CM

## 2017-03-18 DIAGNOSIS — M25651 Stiffness of right hip, not elsewhere classified: Secondary | ICD-10-CM | POA: Diagnosis not present

## 2017-03-18 DIAGNOSIS — M6281 Muscle weakness (generalized): Secondary | ICD-10-CM | POA: Diagnosis not present

## 2017-03-18 NOTE — Therapy (Addendum)
[place], [place], [address] Phone: [phone]   Fax:  [phone]  Physical Therapy Treatment  Patient Details  Name: RIDHI HOFFERT MRN: [id number] Date of Birth: 1956-02-12 Referring Provider: Hulan Saas DO   Encounter Date: 03/18/2017  PT End of Session - 03/18/17 0716    Visit Number  4    Number of Visits  12    Date for PT Re-Evaluation  04/08/17    Authorization Type  BCBS    PT Start Time  0715    PT Stop Time  0753    PT Time Calculation (min)  38 min       Past Medical History:  Diagnosis Date  . Arrhythmia    HR 38 on one occasion when she went to give blood. Holter monitor (12/11) showed HR 40's- 137, average 68, PAC's< several 5-10 beat runs of atrial tachycardia versus AVNRT  . Dyspnea    Echo (12/11) with EF 70%, normal diastolic function, normal RV, normal PA pressures, normal vales. Stress echo showed hypertensive BP response with noevidence for ischemia or infarction by echo or ECG  . Gestational diabetes   . Hx of colonic polyps    x3  . IBS (irritable bowel syndrome)   . Other specified cardiac dysrhythmias(427.89)   . Sleep apnea    lost weight withthe resolution of symptoms but has gain weight back    Past Surgical History:  Procedure Laterality Date  . CHOLECYSTECTOMY    . colonoscopy with polypectomy     > 3; [facility]  . PLANTAR FASCIA RELEASE      There were no vitals filed for this visit.  Subjective Assessment - 03/18/17 0716    Pain Score  3     Pain Location  Hip    Pain Orientation  Lateral;Right    Pain Descriptors / Indicators  Sore                      [facility] Adult PT Treatment/Exercise - 03/18/17 0001      Knee/Hip Exercises: Stretches   Active Hamstring Stretch  2 reps;30 seconds    Other Knee/Hip Stretches  ITB stretch 3 x 30 sec with band     Other Knee/Hip Stretches  Fig 4 and knee to opp shoulder 3 x 30 sec each  bilateral       Knee/Hip Exercises: Aerobic   Recumbent Bike  L3 5 min      Knee/Hip Exercises: Standing   Wall Squat  10 seconds 3 reps     Other Standing Knee Exercises  single and double  leg hip hinge x 8 reps add HEP bilateral hip hinge at wall max cues       Knee/Hip Exercises: Supine   Bridges  3 sets;10 reps    Bridges with [employer]  Both;15 reps    Other Supine Knee/Hip Exercises  reen band clams supine       Knee/Hip Exercises: Sidelying   Clams  3 x 10, reverse 3 x 10               PT Short Term Goals - 03/14/17 0708      PT SHORT TERM GOAL #1   Title  She will report decr paion 30 % or more with transitional movements    Status  On-going      PT SHORT TERM GOAL #2   Title  She will be  independent with initial HEP    Status  Achieved        PT Long Term Goals - 02/28/17 0753      PT LONG TERM GOAL #1   Title  She will be independent with all hEP issued    Time  6    Period  Weeks    Status  New      PT LONG TERM GOAL #2   Title  She will report 75% decr pain with transitional movements.     Time  6    Period  Weeks    Status  New      PT LONG TERM GOAL #3   Title  she will improve RT hip abduction strength to 4/5    Time  6    Period  Weeks    Status  New      PT LONG TERM GOAL #4   Title  She will report able to walk 1 mile for exer  without increase pain.     Time  6    Period  Weeks    Status  New      PT LONG TERM GOAL #5   Title  she will be able to mimic tennis foot movement  with no pain to prepare for return to tennis.     Time  6    Period  Weeks    Status  New            Plan - 03/18/17 0803    Clinical Impression Statement  Reviewed HEP, cues for hip hinges required. Pt reports most pain/pull with figure 4 stretch. She reports she is always very sore after exercises. She is performing them every other day.     PT Next Visit Plan   Strength Modalities, STW, mobs.     PT Home Exercise Plan  use of ball for STW.  Stretching RT hip clam and SLR sideying , shoulder bridge. Ball squeeze and clam with band.  single and double leg hip hinge    Consulted and Agree with Plan of Care  Patient       Patient will benefit from skilled therapeutic intervention in order to improve the following deficits and impairments:  Obesity, Decreased activity tolerance, Decreased range of motion, Increased muscle spasms, Pain, Decreased strength  Visit Diagnosis: Pain in right hip  Muscle weakness (generalized)  Stiffness of right hip, not elsewhere classified     Problem List Patient Active Problem List   Diagnosis Date Noted  . Food allergic skin reaction 03/04/2017  . Tear of gluteus minimus tendon, right, initial encounter 02/22/2017  . Depression 04/08/2015  . Routine general medical examination at a health care facility 10/11/2014  . Sleep apnea 05/08/2012  . Rhinitis 05/08/2012  . Morbid obesity ([facility]) 04/27/2011  . CARDIAC MURMUR 03/30/2010  . PAC (premature atrial contraction) 03/30/2010  . Diabetes mellitus type 2 in obese ([facility]) 02/01/2008  . ELEVATED BLOOD PRESSURE WITHOUT DIAGNOSIS OF HYPERTENSION 02/01/2008    Hessie Diener [facility] 03/18/2017, 8:05 AM  [facility] [address] [place], [place], [address] Phone: [phone]   Fax:  [phone]  Name: TEA COLLUMS MRN: [id number] Date of Birth: 25-Nov-1955  PHYSICAL THERAPY DISCHARGE SUMMARY  Visits from Start of Care: 4  Current functional level related to goals / functional outcomes:   From Dr Darliss Ridgel office note she reports almost back to normal except playing tennis. She did not return after this  PT session Remaining deficits: See Dr Darliss Ridgel note   Education / Equipment: HEP Plan: Patient agrees to discharge.  Patient goals were partially met. Patient is being discharged due to not returning since the last visit.  ?????   Noralee Stain PT   04/21/17

## 2017-03-21 ENCOUNTER — Ambulatory Visit: Payer: Self-pay | Admitting: Family Medicine

## 2017-04-06 NOTE — Progress Notes (Signed)
[name] [place], [place] [address] Phone: [phone] Subjective:    I'm seeing this patient by the request  of:    CC: Right-sided buttocks pain  JAS:[id number]  Margaret [name] is a 61 y.o. female coming in for hip pain. She has been doing PT and using vitamin d. She also uses [facility]. She said that she is improving but does still has some soreness. She has been walking 1 mile a day.  Patient was found to have a gluteal tendon tear and is doing much better.  Feels like she is near herself.  Has not played any tennis yet and that would be what she really wants to get back to.  No radiation down the leg, no numbness no tingling.       Past Medical History:  Diagnosis Date  . Arrhythmia    HR 38 on one occasion when she went to give blood. Holter monitor (12/11) showed HR 40's- 137, average 68, PAC's< several 5-10 beat runs of atrial tachycardia versus AVNRT  . Dyspnea    Echo (12/11) with EF 19%, normal diastolic function, normal RV, normal PA pressures, normal vales. Stress echo showed hypertensive BP response with noevidence for ischemia or infarction by echo or ECG  . Gestational diabetes   . Hx of colonic polyps    x3  . IBS (irritable bowel syndrome)   . Other specified cardiac dysrhythmias(427.89)   . Sleep apnea    lost weight withthe resolution of symptoms but has gain weight back   Past Surgical History:  Procedure Laterality Date  . CHOLECYSTECTOMY    . colonoscopy with polypectomy     > 3; [facility]  . PLANTAR FASCIA RELEASE     Social History   Socioeconomic History  . Marital status: Married    Spouse name: None  . Number of children: None  . Years of education: None  . Highest education level: None  Social Needs  . Financial resource strain: None  . Food insecurity - worry: None  . Food insecurity - inability: None  . Transportation needs - medical: None  . Transportation needs - non-medical: None    Occupational History  . Occupation: Engineer, manufacturing: [employer]  Tobacco Use  . Smoking status: Former Research scientist (life sciences)  . Smokeless tobacco: Never Used  . Tobacco comment: Smoked [age] ONLY   Substance and Sexual Activity  . Alcohol use: Yes    Comment: rarely  . Drug use: No  . Sexual activity: None  Other Topics Concern  . None  Social History Narrative  . None   Allergies  Allergen Reactions  . Amoxicillin     Hives  . Penicillins     Hives  . Paroxetine     Headache , malaise   Family History  Problem Relation Age of Onset  . Colon polyps Father   . COPD Father   . Diabetes Father        diet controlled  . Heart attack Mother 55  . COPD Mother   . Lung cancer Mother   . Peripheral vascular disease Mother   . Hypertension Brother        2 bro  . Stroke Neg Hx      Past medical history, social, surgical and family history all reviewed in electronic medical record.  No pertanent information unless stated regarding to the chief complaint.   Review of  Systems:Review of systems updated and as accurate as of 04/07/17  No headache, visual changes, nausea, vomiting, diarrhea, constipation, dizziness, abdominal pain, skin rash, fevers, chills, night sweats, weight loss, swollen lymph nodes, body aches, joint swelling, chest pain, shortness of breath, mood changes.  Mild positive muscle aches  Objective  Blood pressure 122/88, pulse 88, height 5\' 4"  (1.626 m), weight 237 lb (107.5 kg), SpO2 98 %. Systems examined below as of 04/07/17   General: No apparent distress alert and oriented x3 mood and affect normal, dressed appropriately.  HEENT: Pupils equal, extraocular movements intact  Respiratory: Patient's speak in full sentences and does not appear short of breath  Cardiovascular: No lower extremity edema, non tender, no erythema  Skin: Warm dry intact with no signs of infection or rash on extremities or on axial skeleton.  Abdomen: Soft nontender   Neuro: Cranial nerves II through XII are intact, neurovascularly intact in all extremities with 2+ DTRs and 2+ pulses.  Lymph: No lymphadenopathy of posterior or anterior cervical chain or axillae bilaterally.  Gait normal with good balance and coordination.  MSK:  Non tender with full range of motion and good stability and symmetric strength and tone of shoulders, elbows, wrist, knee and ankles bilaterally.  Right hip shows that patient does have more of a positive Faber.  Still mild discomfort over the gluteal tendon itself. Back exam is unremarkable except for mild stiffness with extension.  Negative straight leg test, neurovascular intact distally   Impression and Recommendations:     This case required medical decision making of moderate complexity.      Note: This dictation was prepared with Dragon dictation along with smaller phrase technology. Any transcriptional errors that result from this process are unintentional.

## 2017-04-07 ENCOUNTER — Ambulatory Visit (INDEPENDENT_AMBULATORY_CARE_PROVIDER_SITE_OTHER): Payer: BLUE CROSS/BLUE SHIELD | Admitting: Family Medicine

## 2017-04-07 ENCOUNTER — Encounter: Payer: Self-pay | Admitting: Family Medicine

## 2017-04-07 DIAGNOSIS — S76011A Strain of muscle, fascia and tendon of right hip, initial encounter: Secondary | ICD-10-CM

## 2017-04-07 NOTE — Assessment & Plan Note (Signed)
Significant improvement overall.  We discussed icing regimen and home exercises.  Discussed which activities of doing which wants to avoid.  Patient will increase activity slowly over the course the next several days.  Follow-up again 2 months

## 2017-04-07 NOTE — Patient Instructions (Signed)
Good to see you  Margaret [name] is your friend.  Stay active.  Keep doing the strengthening with the band When you go back to tennis please ice afterward See me again in 2 months.

## 2017-05-10 ENCOUNTER — Encounter: Payer: Self-pay | Admitting: Internal Medicine

## 2017-05-10 DIAGNOSIS — R0683 Snoring: Secondary | ICD-10-CM

## 2017-05-16 ENCOUNTER — Other Ambulatory Visit: Payer: Self-pay | Admitting: Internal Medicine

## 2017-05-16 DIAGNOSIS — G4730 Sleep apnea, unspecified: Secondary | ICD-10-CM

## 2017-05-21 LAB — HM DIABETES EYE EXAM

## 2017-05-24 ENCOUNTER — Encounter: Payer: Self-pay | Admitting: Internal Medicine

## 2017-05-24 DIAGNOSIS — N8502 Endometrial intraepithelial neoplasia [EIN]: Secondary | ICD-10-CM | POA: Diagnosis not present

## 2017-05-24 DIAGNOSIS — Z6839 Body mass index (BMI) 39.0-39.9, adult: Secondary | ICD-10-CM | POA: Diagnosis not present

## 2017-05-24 DIAGNOSIS — N950 Postmenopausal bleeding: Secondary | ICD-10-CM | POA: Diagnosis not present

## 2017-05-25 ENCOUNTER — Other Ambulatory Visit: Payer: Self-pay | Admitting: Family Medicine

## 2017-05-30 DIAGNOSIS — L570 Actinic keratosis: Secondary | ICD-10-CM | POA: Diagnosis not present

## 2017-05-30 DIAGNOSIS — L821 Other seborrheic keratosis: Secondary | ICD-10-CM | POA: Diagnosis not present

## 2017-05-30 DIAGNOSIS — L918 Other hypertrophic disorders of the skin: Secondary | ICD-10-CM | POA: Diagnosis not present

## 2017-05-30 DIAGNOSIS — D225 Melanocytic nevi of trunk: Secondary | ICD-10-CM | POA: Diagnosis not present

## 2017-05-31 ENCOUNTER — Encounter: Payer: Self-pay | Admitting: Internal Medicine

## 2017-06-01 ENCOUNTER — Telehealth: Payer: Self-pay | Admitting: *Deleted

## 2017-06-01 NOTE — Telephone Encounter (Signed)
Spoke with [facility] and gave the appt for February 28th at 12:15pm. Verfied insurance and demographics

## 2017-06-08 ENCOUNTER — Encounter: Payer: Self-pay | Admitting: Neurology

## 2017-06-08 ENCOUNTER — Ambulatory Visit (INDEPENDENT_AMBULATORY_CARE_PROVIDER_SITE_OTHER): Payer: BLUE CROSS/BLUE SHIELD | Admitting: Neurology

## 2017-06-08 VITALS — BP 110/80 | HR 80 | Ht 64.5 in | Wt 233.0 lb

## 2017-06-08 DIAGNOSIS — R351 Nocturia: Secondary | ICD-10-CM | POA: Diagnosis not present

## 2017-06-08 DIAGNOSIS — G478 Other sleep disorders: Secondary | ICD-10-CM

## 2017-06-08 DIAGNOSIS — G4733 Obstructive sleep apnea (adult) (pediatric): Secondary | ICD-10-CM

## 2017-06-08 NOTE — Progress Notes (Signed)
Subjective:    Patient ID: Margaret [name] is a 62 y.o. female.  HPI     Star Age, MD, PhD [facility] [address] [place], [place] [address]  Dear Dr. Sharlet Salina,  I saw your patient, Margaret [name], upon your kind request in my neurologic clinic today for initial consultation of her sleep disorder, in particular reevaluation for obstructive sleep apnea. The patient is unaccompanied today. As you know, Margaret [name] is a 62 year old right-handed woman with an underlying medical history of diabetes, irritable bowel syndrome, endometrial cancer, and obesity, who was previously diagnosed with obstructive sleep apnea and treated with CPAP therapy in the past. She does not use CPAP any longer. Prior sleep study results are not available for my review today. She reports she was able to lose weight, in the realm of 80 pounds, and therefore stopped using CPAP. She gained weight back and reports snoring and sleep disruption.  She has an Epworth sleepiness score of 0/24, Fatigue score of 32/63. She lives alone, has one child. She works for [employer]. She is a non smoker, drinks alcohol rarely, and does not drink caffeine as she does not like the way she feels after having caffeine. She tries to keep a good sleep schedule, typically in bed around 9 or 9:30, rice time around 5:30. She has nocturia about once per average night and does not typically have any morning headaches. She reports vivid dreams. She has no family history of OSA. She has struggled with her weight for most of her life. She is going to have to have a hysterectomy due to a recent diagnosis of endometrial cancer. She is understandably worried about it. She may not be able to schedule an overnight sleep study for that reason I would prefer a home sleep test. She does not endorse any telltale restless leg symptoms. She has 2 cats.   Her Past Medical History Is Significant For: Past  Medical History:  Diagnosis Date  . Arrhythmia    HR 38 on one occasion when she went to give blood. Holter monitor (12/11) showed HR 40's- 137, average 68, PAC's< several 5-10 beat runs of atrial tachycardia versus AVNRT  . Dyspnea    Echo (12/11) with EF 54%, normal diastolic function, normal RV, normal PA pressures, normal vales. Stress echo showed hypertensive BP response with noevidence for ischemia or infarction by echo or ECG  . Gestational diabetes   . Hx of colonic polyps    x3  . IBS (irritable bowel syndrome)   . Other specified cardiac dysrhythmias(427.89)   . Sleep apnea    lost weight withthe resolution of symptoms but has gain weight back    Her Past Surgical History Is Significant For: Past Surgical History:  Procedure Laterality Date  . CHOLECYSTECTOMY    . colonoscopy with polypectomy     > 3; [facility]  . PLANTAR FASCIA RELEASE      Her Family History Is Significant For: Family History  Problem Relation Age of Onset  . Colon polyps Father   . COPD Father   . Diabetes Father        diet controlled  . Heart attack Mother 64  . COPD Mother   . Lung cancer Mother   . Peripheral vascular disease Mother   . Hypertension Brother        2 bro  . Stroke Neg Hx     Her Social History Is Significant For: Social History  Socioeconomic History  . Marital status: Married    Spouse name: None  . Number of children: None  . Years of education: None  . Highest education level: None  Social Needs  . Financial resource strain: None  . Food insecurity - worry: None  . Food insecurity - inability: None  . Transportation needs - medical: None  . Transportation needs - non-medical: None  Occupational History  . Occupation: Engineer, manufacturing: [employer]  Tobacco Use  . Smoking status: Former Research scientist (life sciences)  . Smokeless tobacco: Never Used  . Tobacco comment: Smoked 16-19 ONLY   Substance and Sexual Activity  . Alcohol use: Yes    Comment:  rarely  . Drug use: No  . Sexual activity: None  Other Topics Concern  . None  Social History Narrative  . None    Her Allergies Are:  Allergies  Allergen Reactions  . Amoxicillin     Hives  . Penicillins     Hives  . Paroxetine     Headache , malaise  :   Her Current Medications Are:  Outpatient Encounter Medications as of 06/08/2017  Medication Sig  . albuterol (PROVENTIL HFA;VENTOLIN HFA) 108 (90 Base) MCG/ACT inhaler Inhale 2 puffs into the lungs every 6 (six) hours as needed for wheezing or shortness of breath.  . Calcium-Mag-Vit C-Vit D 185-28-2.5-200 CAPS   . Diclofenac Sodium (PENNSAID) 2 % SOLN Place 2 application 2 (two) times daily onto the skin.  Marland Kitchen lisinopril (PRINIVIL,ZESTRIL) 2.5 MG tablet Take 1 tablet (2.5 mg total) daily by mouth.  . metFORMIN (GLUCOPHAGE XR) 500 MG 24 hr tablet Take 1 tablet (500 mg total) by mouth daily with breakfast.  . vitamin B-12 (CYANOCOBALAMIN) 1000 MCG tablet Take 1,000 mcg by mouth daily. Reported on 10/01/2015  . Vitamin D, Ergocalciferol, (DRISDOL) 50000 units CAPS capsule TAKE 1 CAPSULE (50,000 UNITS TOTAL) EVERY 7 (SEVEN) DAYS BY MOUTH.   No facility-administered encounter medications on file as of 06/08/2017.   :  Review of Systems:  Out of a complete 14 point review of systems, all are reviewed and negative with the exception of these symptoms as listed below: Review of Systems  Neurological:       Pt presents today to discuss her sleep. Pt used a cpap but was told that she had lost enough weight that she did not need it any longer. Pt has now gained the weight back. Pt's current cpap is about 10 years ago. Pt does endorse snoring.  Epworth Sleepiness Scale 0= would never doze 1= slight chance of dozing 2= moderate chance of dozing 3= high chance of dozing  Sitting and reading: 0 Watching TV: 0 Sitting inactive in a public place (ex. Theater or meeting): 0 As a passenger in a car for an hour without a break: 0 Lying  down to rest in the afternoon: 0 Sitting and talking to someone: 0 Sitting quietly after lunch (no alcohol): 0 In a car, while stopped in traffic: 0 Total: 0     Objective:  Neurological Exam  Physical Exam Physical Examination:   Vitals:   06/08/17 1524  BP: 110/80  Pulse: 80    General Examination: The patient is a very pleasant 62 y.o. female in no acute distress. She appears well-developed and well-nourished and well groomed.   HEENT: Normocephalic, atraumatic, pupils are equal, round and reactive to light and accommodation. Extraocular tracking is good without limitation to gaze excursion or nystagmus  noted. Normal smooth pursuit is noted. Hearing is grossly intact. Face is symmetric with normal facial animation and normal facial sensation. Speech is clear with no dysarthria noted. There is no hypophonia. There is no lip, neck/head, jaw or voice tremor. Neck is supple with full range of passive and active motion. There are no carotid bruits on auscultation. Oropharynx exam reveals: mild mouth dryness, adequate dental hygiene and mild airway crowding, due to smaller airway entry, thicker uvula and tonsils the place of 1+ bilaterally. Mallampati is class I. Neck circumference is 16-1/4 inches. She has a mild overbite. Tongue protrudes centrally and palate elevates symmetrically.   Chest: Clear to auscultation without wheezing, rhonchi or crackles noted.  Heart: S1+S2+0, regular and normal without murmurs, rubs or gallops noted.   Abdomen: Soft, non-tender and non-distended with normal bowel sounds appreciated on auscultation.  Extremities: There is no pitting edema in the distal lower extremities bilaterally. Pedal pulses are intact.  Skin: Warm and dry without trophic changes noted.  Musculoskeletal: exam reveals no obvious joint deformities, tenderness or joint swelling or erythema.   Neurologically:  Mental status: The patient is awake, alert and oriented in all 4 spheres.  Her immediate and remote memory, attention, language skills and fund of knowledge are appropriate. There is no evidence of aphasia, agnosia, apraxia or anomia. Speech is clear with normal prosody and enunciation. Thought process is linear. Mood is normal and affect is normal.  Cranial nerves II - XII are as described above under HEENT exam. In addition: shoulder shrug is normal with equal shoulder height noted. Motor exam: Normal bulk, strength and tone is noted. There is no drift, tremor or rebound. Romberg is negative. Reflexes are 2+ throughout. Fine motor skills and coordination: intact with normal finger taps, normal hand movements, normal rapid alternating patting, normal foot taps and normal foot agility.  Cerebellar testing: No dysmetria or intention tremor on finger to nose testing. Heel to shin is unremarkable bilaterally. There is no truncal or gait ataxia.  Sensory exam: intact to light touch in the upper and lower extremities.  Gait, station and balance: She stands easily. No veering to one side is noted. No leaning to one side is noted. Posture is age-appropriate and stance is narrow based. Gait shows normal stride length and pace. No problems turning. Tandem walk is unremarkable.   Assessment and Plan:    In summary, Margaret [name] is a very pleasant 61 y.o.-year old female with an underlying medical history of diabetes, irritable bowel syndrome, endometrial cancer, and obesity, whose history and physical exam are concerning for recurrence for obstructive sleep apnea. She carries a prior diagnosis of this and has an old CPAP machine. She would benefit from reevaluation and treatment.  I had a long chat with the patient about my findings and the diagnosis of OSA, its prognosis and treatment options. We talked about medical treatments, surgical interventions and non-pharmacological approaches. I explained in particular the risks and ramifications of untreated moderate to severe OSA,  especially with respect to developing cardiovascular disease down the Road, including congestive heart failure, difficult to treat hypertension, cardiac arrhythmias, or stroke. Even type 2 diabetes has, in part, been linked to untreated OSA. Symptoms of untreated OSA include daytime sleepiness, memory problems, mood irritability and mood disorder such as depression and anxiety, lack of energy, as well as recurrent headaches, especially morning headaches. We talked about trying to maintain a healthy lifestyle in general, as well as the importance of weight control. I encouraged  the patient to eat healthy, exercise daily and keep well hydrated, to keep a scheduled bedtime and wake time routine, to not skip any meals and eat healthy snacks in between meals. I advised the patient not to drive when feeling sleepy.  I recommended the following at this time: home sleep test, for patient convenience as she has been recently diagnosed with endometrial cancer and will require hysterectomy, and therefore is not able to schedule an overnight stay for sleep study testing, understandably.  I explained the sleep test procedure to the patient and also outlined possible surgical and non-surgical treatment options of OSA, including the use of a custom-made dental device (which would require a referral to a specialist dentist or oral surgeon), upper airway surgical options, such as pillar implants, radiofrequency surgery, tongue base surgery, and UPPP (which would involve a referral to an ENT surgeon). Rarely, jaw surgery such as mandibular advancement may be considered.  I also explained the CPAP treatment option to the patient, who indicated that she would be willing to try CPAP again, if the need arises. I explained the importance of being compliant with PAP treatment, not only for insurance purposes but primarily to improve Her symptoms, and for the patient's long term health benefit, including to reduce Her cardiovascular  risks. I answered all her questions today and the patient was in agreement. I would like to see her back after the sleep study is completed and encouraged her to call with any interim questions, concerns, problems or updates.   Thank you very much for allowing me to participate in the care of this nice patient. If I can be of any further assistance to you please do not hesitate to call me at [phone].  Sincerely,   Star Age, MD, PhD

## 2017-06-08 NOTE — Patient Instructions (Signed)
Thank you for choosing [facility] for your sleep related care! It was nice to meet you today! I appreciate that you entrust me with your sleep related healthcare concerns. I hope, I was able to address at least some of your concerns today, and that I can help you feel reassured and also get better.    Here is what we discussed today and what we came up with as our plan for you:    Based on your symptoms and your exam I believe you may still at risk for obstructive sleep apnea and would benefit from reevaluation as it has been many years and you have gained weigh. Therefore, I think we should proceed with a sleep study (as requested we will do a home sleep test), to determine how severe your sleep apnea is. If you have more than mild OSA, I want you to consider ongoing treatment with CPAP. Please remember, the risks and ramifications of moderate to severe obstructive sleep apnea or OSA are: Cardiovascular disease, including congestive heart failure, stroke, difficult to control hypertension, arrhythmias, and even type 2 diabetes has been linked to untreated OSA. Sleep apnea causes disruption of sleep and sleep deprivation in most cases, which, in turn, can cause recurrent headaches, problems with memory, mood, concentration, focus, and vigilance. Most people with untreated sleep apnea report excessive daytime sleepiness, which can affect their ability to drive. Please do not drive if you feel sleepy.   I will likely see you back after your sleep study to go over the test results and where to go from there. We will call you after your sleep study to advise about the results (most likely, you will hear from [place], my nurse) and to set up an appointment at the time, as necessary.    Our sleep lab administrative assistant will call you to schedule your sleep study. If you don't hear back from her by about 2 weeks from now, please feel free to call her at [phone]. You can leave a  message with your phone number and concerns, if you get the voicemail box. She will call back as soon as possible.

## 2017-06-12 ENCOUNTER — Other Ambulatory Visit: Payer: Self-pay | Admitting: Internal Medicine

## 2017-06-12 DIAGNOSIS — E669 Obesity, unspecified: Principal | ICD-10-CM

## 2017-06-12 DIAGNOSIS — E1169 Type 2 diabetes mellitus with other specified complication: Secondary | ICD-10-CM

## 2017-06-15 ENCOUNTER — Other Ambulatory Visit: Payer: Self-pay | Admitting: Internal Medicine

## 2017-06-16 ENCOUNTER — Inpatient Hospital Stay: Payer: BLUE CROSS/BLUE SHIELD

## 2017-06-16 ENCOUNTER — Encounter: Payer: Self-pay | Admitting: Gynecologic Oncology

## 2017-06-16 ENCOUNTER — Inpatient Hospital Stay: Payer: BLUE CROSS/BLUE SHIELD | Attending: Gynecologic Oncology | Admitting: Gynecologic Oncology

## 2017-06-16 VITALS — BP 118/84 | HR 75 | Temp 99.5°F | Resp 18 | Ht 64.0 in | Wt 232.0 lb

## 2017-06-16 DIAGNOSIS — C541 Malignant neoplasm of endometrium: Secondary | ICD-10-CM | POA: Diagnosis not present

## 2017-06-16 DIAGNOSIS — E669 Obesity, unspecified: Secondary | ICD-10-CM

## 2017-06-16 DIAGNOSIS — E1169 Type 2 diabetes mellitus with other specified complication: Secondary | ICD-10-CM

## 2017-06-16 LAB — HEMOGLOBIN A1C
Hgb A1c MFr Bld: 7.6 % — ABNORMAL HIGH (ref 4.8–5.6)
Mean Plasma Glucose: 171.42 mg/dL

## 2017-06-16 NOTE — Progress Notes (Signed)
Consult Note: Gyn-Onc  Consult was requested by Dr. Stann Mainland for the evaluation of Margaret [name] 62 y.o. female  CC:  Chief Complaint  Patient presents with  . Endometrial carcinoma [facility])    Assessment/Plan:  Margaret [name] is a 62 y.o. with EIN and suspicion for malignancy.  She was counseled that the recommendation is for hysterectomy bilateral salpingo-oophorectomy with sentinel lymph node dissection.  I extensively reviewed the risks of robotic hysterectomy and possible lymph node dissection of infection, bleeding, damage to nearby organs including bowel, bladder, vessels, nerves, and ureters. We discussed postoperative risks including infection, PE/ DVT, and lymphedema. We reviewed possible need for conversion to open laparotomy, need for possible blood transfusion. I discussed positioning during surgery of trendelenberg and risks of minor facial swelling and care we take in preoperative positioning. We also reviewed possible need for further treatment such as radiation or chemotherapy based on final pathology. Expected postoperative recovery was also discussed.  She is aware that Dr. Willaim Sheng will be her surgeon   HPI: Margaret [name] is a 62 y.o. Gravida 1 para 1 last menstrual period at approximately 62 years of age.  She noted an episode of vaginal bleeding approximately 3 weeks ago pelvic ultrasound on May 24, 2017 notable for an endometrium that was thickened and hypervascular uterus measured 9.3 cm in greatest length of the left ovary was within normal limits comment was made regarding the right ovary.  A 1.6 cm fibroid was appreciated.  I do endometrial biopsy was collected and notable for a fragmented specimen with at least endometrial intraepithelial neoplasia and features highly suspicious of endometrioid carcinoma.  She has been on Micronase progesterone since the vaginal bleeding and takes 200 mg daily.  [name] states that the vaginal bleeding has markedly  decreased she reports 15 pound weight loss in the last month. Her family history is unremarkable for associated malignancy.  Review of Systems:  Constitutional  Feels well, Cardiovascular  No chest pain, shortness of breath, or edema  Pulmonary  No cough or wheeze.  Gastro Intestinal  No nausea, vomitting, or diarrhoea. No bright red blood per rectum, no abdominal pain, change in bowel movement, or constipation.  Genito Urinary  No frequency, urgency, dysuria occasional vaginal spotting  Musculo Skeletal  No myalgia, arthralgia, joint swelling or pain  Neurologic  No weakness, numbness, change in gait,  Psychology  No depression, anxiety, insomnia.    Current Meds:  Outpatient Encounter Medications as of 06/16/2017  Medication Sig  . Calcium-Mag-Vit C-Vit D [id number] CAPS   . Diclofenac Sodium (PENNSAID) 2 % SOLN Place 2 application 2 (two) times daily onto the skin.  Marland Kitchen lisinopril (PRINIVIL,ZESTRIL) 2.5 MG tablet TAKE 1 TABLET (2.5 MG TOTAL) DAILY BY MOUTH.  . metFORMIN (GLUCOPHAGE XR) 500 MG 24 hr tablet Take 1 tablet (500 mg total) by mouth daily with breakfast.  . Vitamin D, Ergocalciferol, (DRISDOL) 50000 units CAPS capsule TAKE 1 CAPSULE (50,000 UNITS TOTAL) EVERY 7 (SEVEN) DAYS BY MOUTH.  Marland Kitchen albuterol (PROVENTIL HFA;VENTOLIN HFA) 108 (90 Base) MCG/ACT inhaler Inhale 2 puffs into the lungs every 6 (six) hours as needed for wheezing or shortness of breath. (Patient not taking: Reported on 06/16/2017)  . [DISCONTINUED] vitamin B-12 (CYANOCOBALAMIN) 1000 MCG tablet Take 1,000 mcg by mouth daily. Reported on 10/01/2015   No facility-administered encounter medications on file as of 06/16/2017.     Allergy:  Allergies  Allergen Reactions  . Amoxicillin     Hives  .  Penicillins     Hives  . Paroxetine     Headache , malaise    Social Hx:   Social History   Socioeconomic History  . Marital status: Married    Spouse name: Not on file  . Number of children: Not on  file  . Years of education: Not on file  . Highest education level: Not on file  Social Needs  . Financial resource strain: Not on file  . Food insecurity - worry: Not on file  . Food insecurity - inability: Not on file  . Transportation needs - medical: Not on file  . Transportation needs - non-medical: Not on file  Occupational History  . Occupation: Engineer, manufacturing: [employer]  Tobacco Use  . Smoking status: Former Research scientist (life sciences)  . Smokeless tobacco: Never Used  . Tobacco comment: Smoked [age] ONLY   Substance and Sexual Activity  . Alcohol use: Yes    Comment: rarely  . Drug use: No  . Sexual activity: Not on file  Other Topics Concern  . Not on file  Social History Narrative  . Not on file    Past Surgical Hx:  Past Surgical History:  Procedure Laterality Date  . CHOLECYSTECTOMY    . colonoscopy with polypectomy     > 3; [facility]  . PLANTAR FASCIA RELEASE      Past Medical Hx:  Past Medical History:  Diagnosis Date  . Arrhythmia    HR 38 on one occasion when she went to give blood. Holter monitor (12/11) showed HR 40's- 137, average 68, PAC's< several 5-10 beat runs of atrial tachycardia versus AVNRT  . Dyspnea    Echo (12/11) with EF 93%, normal diastolic function, normal RV, normal PA pressures, normal vales. Stress echo showed hypertensive BP response with noevidence for ischemia or infarction by echo or ECG  . Gestational diabetes   . Hx of colonic polyps    x3  . IBS (irritable bowel syndrome)   . Other specified cardiac dysrhythmias(427.89)   . Sleep apnea    lost weight withthe resolution of symptoms but has gain weight back  Last colonoscopy 6 years ago with removal of polyps.  Last mammogram October 2018  Past Gynecological History: Menarche age 33 menses every month.  Reports significant dysmenorrhea until her 60s.  Menopause at age 62.   Reports oral contraceptive pill use for over 20 years.  Denies a history of an abnormal Pap  test  family Hx:  Family History  Problem Relation Age of Onset  . Colon polyps Father   . COPD Father   . Diabetes Father        diet controlled  . Heart attack Mother 78  . COPD Mother   . Lung cancer Mother   . Peripheral vascular disease Mother   . Hypertension Brother        2 bro  . Stroke Neg Hx     Vitals:  Blood pressure 118/84, pulse 75, temperature 99.5 F (37.5 C), temperature source Oral, resp. rate 18, height 5\' 4"  (1.626 m), weight 232 lb (105.2 kg), SpO2 98 %. Body mass index is 39.82 kg/m.   Physical Exam: WD in NAD Neck  Supple NROM, without any enlargements.  Lymph Node Survey No cervical supraclavicular or inguinal adenopathy Cardiovascular  Pulse normal rate, regularity and rhythm. S1 and S2 normal.  Lungs  Clear to auscultation bilaterally, without wheezes/crackles/rhonchi. Good air movement.  Skin  No rash/lesions/breakdown  Psychiatry  Alert and oriented appropriate mood affect speech and reasoning. Abdomen  Normoactive bowel sounds, abdomen soft, non-tender.  Cystectomy surgical  sites intact without evidence of hernia.  Back No CVA tenderness Genito Urinary  Vulva/vagina: Normal external female genitalia.  No lesions. No discharge or bleeding.  Bladder/urethra:  No lesions or masses  Vagina: Blood in the vaginal vault cervix: Normal appearing, no lesions.  Uterus: Mobile, no parametrial involvement or nodularity.  Adnexa: No palpable masses, examination limited by body habitus Rectal  Good tone, no masses no cul de sac nodularity.  Extremities  No bilateral cyanosis, clubbing or edema.   Janie Morning, MD, PhD 06/16/2017, 1:36 PM

## 2017-06-16 NOTE — Patient Instructions (Signed)
Preparing for your Surgery  Plan for surgery on July 19, 2017 with Dr. Everitt Amber at [facility] will be scheduled for a robotic assisted total hysterectomy, bilateral salpingo-oophorectomy, sentinel lymph node biopsy.  Pre-operative Testing -You will receive a phone call from presurgical testing at [facility] to arrange for a pre-operative testing appointment before your surgery.  This appointment normally occurs one to two weeks before your scheduled surgery.   -Bring your insurance card, copy of an advanced directive if applicable, medication list  -At that visit, you will be asked to sign a consent for a possible blood transfusion in case a transfusion becomes necessary during surgery.  The need for a blood transfusion is rare but having consent is a necessary part of your care.     -You should not be taking blood thinners or aspirin at least ten days prior to surgery unless instructed by your surgeon.  Day Before Surgery at [place] will be asked to take in a light diet the day before surgery.  Avoid carbonated beverages.  You will be advised to have nothing to eat or drink after midnight the evening before.    Eat a light diet the day before surgery.  Examples including soups, broths, toast, yogurt, mashed potatoes.  Things to avoid include carbonated beverages (fizzy beverages), raw fruits and raw vegetables, or beans.   If your bowels are filled with gas, your surgeon will have difficulty visualizing your pelvic organs which increases your surgical risks.  Your role in recovery Your role is to become active as soon as directed by your doctor, while still giving yourself time to heal.  Rest when you feel tired. You will be asked to do the following in order to speed your recovery:  - Cough and breathe deeply. This helps toclear and expand your lungs and can prevent pneumonia. You may be given a spirometer to practice deep breathing. A staff member  will show you how to use the spirometer. - Do mild physical activity. Walking or moving your legs help your circulation and body functions return to normal. A staff member will help you when you try to walk and will provide you with simple exercises. Do not try to get up or walk alone the first time. - Actively manage your pain. Managing your pain lets you move in comfort. We will ask you to rate your pain on a scale of zero to 10. It is your responsibility to tell your doctor or nurse where and how much you hurt so your pain can be treated.  Special Considerations -If you are diabetic, you may be placed on insulin after surgery to have closer control over your blood sugars to promote healing and recovery.  This does not mean that you will be discharged on insulin.  If applicable, your oral antidiabetics will be resumed when you are tolerating a solid diet.  -Your final pathology results from surgery should be available by the Friday after surgery and the results will be relayed to you when available.  -Dr. Lahoma Crocker is the Surgeon that assists your GYN Oncologist with surgery.  The next day after your surgery you will either see your GYN Oncologist or Dr. Lahoma Crocker.   Blood Transfusion Information WHAT IS A BLOOD TRANSFUSION? A transfusion is the replacement of blood or some of its parts. Blood is made up of multiple cells which provide different functions.  Red blood cells carry oxygen and are used for blood  loss replacement.  White blood cells fight against infection.  Platelets control bleeding.  Plasma helps clot blood.  Other blood products are available for specialized needs, such as hemophilia or other clotting disorders. BEFORE THE TRANSFUSION  Who gives blood for transfusions?   You may be able to donate blood to be used at a later date on yourself (autologous donation).  Relatives can be asked to donate blood. This is generally not any safer than if you have  received blood from a stranger. The same precautions are taken to ensure safety when a relative's blood is donated.  Healthy volunteers who are fully evaluated to make sure their blood is safe. This is blood bank blood. Transfusion therapy is the safest it has ever been in the practice of medicine. Before blood is taken from a donor, a complete history is taken to make sure that person has no history of diseases nor engages in risky social behavior (examples are intravenous drug use or sexual activity with multiple partners). The donor's travel history is screened to minimize risk of transmitting infections, such as malaria. The donated blood is tested for signs of infectious diseases, such as HIV and hepatitis. The blood is then tested to be sure it is compatible with you in order to minimize the chance of a transfusion reaction. If you or a relative donates blood, this is often done in anticipation of surgery and is not appropriate for emergency situations. It takes many days to process the donated blood. RISKS AND COMPLICATIONS Although transfusion therapy is very safe and saves many lives, the main dangers of transfusion include:   Getting an infectious disease.  Developing a transfusion reaction. This is an allergic reaction to something in the blood you were given. Every precaution is taken to prevent this. The decision to have a blood transfusion has been considered carefully by your caregiver before blood is given. Blood is not given unless the benefits outweigh the risks.

## 2017-06-22 ENCOUNTER — Telehealth: Payer: Self-pay | Admitting: *Deleted

## 2017-06-22 NOTE — Telephone Encounter (Signed)
Patient called and left a message regarding FMLA papers. Returned the patient's call, advised the patient to drop off the paperwork to the front desk with Mrs. Wilma. Advised her to place them in an envelope marked [employer]. We will fill out the paperwork and send it off for her.

## 2017-07-11 ENCOUNTER — Ambulatory Visit (INDEPENDENT_AMBULATORY_CARE_PROVIDER_SITE_OTHER): Payer: BLUE CROSS/BLUE SHIELD | Admitting: Neurology

## 2017-07-11 DIAGNOSIS — G4733 Obstructive sleep apnea (adult) (pediatric): Secondary | ICD-10-CM | POA: Diagnosis not present

## 2017-07-11 DIAGNOSIS — G478 Other sleep disorders: Secondary | ICD-10-CM

## 2017-07-11 DIAGNOSIS — R351 Nocturia: Secondary | ICD-10-CM

## 2017-07-13 ENCOUNTER — Encounter (HOSPITAL_COMMUNITY): Payer: Self-pay

## 2017-07-13 NOTE — Patient Instructions (Addendum)
Margaret [name]  07/13/2017   Your procedure is scheduled on:  Tuesday, 04/ 025/ 2019  Report to [facility] Main  Entrance  Report to admitting   ARRIVE AT 530 AM. Have a seat in the Main Lobby. Please note there is a phone at the [employer]. Please call [phone] on that phone. Someone from Short Stay will come and get you from the Main Lobby and take you to Short Stay.  Call this number if you have problems the morning of surgery [phone]   Remember: Do not eat food After Midnight with exception clear liquid diet until 4:30AM              Eat a light diet the day before surgery.  Examples including soups, broths, toast, yogurt, mashed potatoes.  Things to avoid include carbonated beverages (fizzy beverages), raw fruits and raw vegetables, or beans.   If your bowels are filled with gas, your surgeon will have difficulty visualizing your pelvic organs which increases your surgical risks.     CLEAR LIQUID DIET   Foods Allowed                                                                     Foods Excluded  Coffee and tea, regular and decaf                             liquids that you cannot  Plain Jell-O in any flavor                                             see through such as: Fruit ices (not with fruit pulp)                                     milk, soups, orange juice  Iced Popsicles                                    All solid food Carbonated beverages, regular and diet                                    Cranberry, grape and apple juices Sports drinks like Gatorade Lightly seasoned clear broth or consume(fat free) Sugar, honey syrup  Sample Menu Breakfast                                Lunch                                     Supper Cranberry juice  Beef broth                            Chicken broth Jell-O                                     Grape juice                           Apple juice Coffee or  tea                        Jell-O                                      Popsicle                                                Coffee or tea                        Coffee or tea  _____________________________________________________________________  NO SOLID FOOD AFTER MIDNIGHT THE NIGHT PRIOR TO SURGERY. NOTHING BY MOUTH EXCEPT CLEAR LIQUIDS UNTIL 3 HOURS PRIOR TO [place] SURGERY. PLEASE FINISH ENSURE DRINK PER SURGEON ORDER 3 HOURS PRIOR TO SCHEDULED SURGERY TIME WHICH NEEDS TO BE COMPLETED AT ___4:30AM_____.    Take these medicines the morning of surgery with A SIP OF WATER:   NONE DO NOT TAKE ANY DIABETIC MEDICATIONS DAY OF YOUR SURGERY                               You may not have any metal on your body including hair pins and              piercings  Do not wear jewelry, make-up, lotions, powders or perfumes, deodorant             Do not wear nail polish.  Do not shave  48 hours prior to surgery.              Do not bring valuables to the hospital. [place].  Contacts, dentures or bridgework may not be worn into surgery.  Leave suitcase in the car. After surgery it may be brought to your room.   Special Instructions: Cough and Deep breathing/  Leg exercises              Please read over the following fact sheets you were given: _____________________________________________________________________             [facility] - Preparing for Surgery Before surgery, you can play an important role.  Because skin is not sterile, your skin needs to be as free of germs as possible.  You can reduce the number of germs on your skin by washing with CHG (chlorahexidine gluconate) soap before surgery.  CHG is an antiseptic cleaner which kills germs and bonds with the skin to continue killing germs even after washing. Please DO NOT use  if you have an allergy to CHG or antibacterial soaps.  If your skin becomes reddened/irritated stop using the  CHG and inform your nurse when you arrive at Short Stay. Do not shave (including legs and underarms) for at least 48 hours prior to the first CHG shower.  You may shave your face/neck. Please follow these instructions carefully:  1.  Shower with CHG Soap the night before surgery and the  morning of Surgery.  2.  If you choose to wash your hair, wash your hair first as usual with your  normal  shampoo.  3.  After you shampoo, rinse your hair and body thoroughly to remove the  shampoo.                           4.  Use CHG as you would any other liquid soap.  You can apply chg directly  to the skin and wash                       Gently with a scrungie or clean washcloth.  5.  Apply the CHG Soap to your body ONLY FROM THE NECK DOWN.   Do not use on face/ open                           Wound or open sores. Avoid contact with eyes, ears mouth and genitals (private parts).                       Wash face,  Genitals (private parts) with your normal soap.             6.  Wash thoroughly, paying special attention to the area where your surgery  will be performed.  7.  Thoroughly rinse your body with warm water from the neck down.  8.  DO NOT shower/wash with your normal soap after using and rinsing off  the CHG Soap.                9.  Pat yourself dry with a clean towel.            10.  Wear clean pajamas.            11.  Place clean sheets on your bed the night of your first shower and do not  sleep with pets. Day of Surgery : Do not apply any lotions/deodorants the morning of surgery.  Please wear clean clothes to the hospital/surgery center.  FAILURE TO FOLLOW THESE INSTRUCTIONS MAY RESULT IN THE CANCELLATION OF YOUR SURGERY PATIENT SIGNATURE_________________________________  NURSE SIGNATURE__________________________________  ________________________________________________________________________   Adam Phenix  An incentive spirometer is a tool that can help keep your lungs clear  and active. This tool measures how well you are filling your lungs with each breath. Taking long deep breaths may help reverse or decrease the chance of developing breathing (pulmonary) problems (especially infection) following:  A long period of time when you are unable to move or be active. BEFORE THE PROCEDURE   If the spirometer includes an indicator to show your best effort, your nurse or respiratory therapist will set it to a desired goal.  If possible, sit up straight or lean slightly forward. Try not to slouch.  Hold the incentive spirometer in an upright position. INSTRUCTIONS FOR USE  1. Sit on the edge of your bed if possible, or  sit up as far as you can in bed or on a chair. 2. Hold the incentive spirometer in an upright position. 3. Breathe out normally. 4. Place the mouthpiece in your mouth and seal your lips tightly around it. 5. Breathe in slowly and as deeply as possible, raising the piston or the ball toward the top of the column. 6. Hold your breath for 3-5 seconds or for as long as possible. Allow the piston or ball to fall to the bottom of the column. 7. Remove the mouthpiece from your mouth and breathe out normally. 8. Rest for a few seconds and repeat Steps 1 through 7 at least 10 times every 1-2 hours when you are awake. Take your time and take a few normal breaths between deep breaths. 9. The spirometer may include an indicator to show your best effort. Use the indicator as a goal to work toward during each repetition. 10. After each set of 10 deep breaths, practice coughing to be sure your lungs are clear. If you have an incision (the cut made at the time of surgery), support your incision when coughing by placing a pillow or rolled up towels firmly against it. Once you are able to get out of bed, walk around indoors and cough well. You may stop using the incentive spirometer when instructed by your caregiver.  RISKS AND COMPLICATIONS  Take your time so you do not  get dizzy or light-headed.  If you are in pain, you may need to take or ask for pain medication before doing incentive spirometry. It is harder to take a deep breath if you are having pain. AFTER USE  Rest and breathe slowly and easily.  It can be helpful to keep track of a log of your progress. Your caregiver can provide you with a simple table to help with this. If you are using the spirometer at home, follow these instructions: [place] IF:   You are having difficultly using the spirometer.  You have trouble using the spirometer as often as instructed.  Your pain medication is not giving enough relief while using the spirometer.  You develop fever of 100.5 F (38.1 C) or higher. SEEK IMMEDIATE MEDICAL CARE IF:   You cough up bloody sputum that had not been present before.  You develop fever of 102 F (38.9 C) or greater.  You develop worsening pain at or near the incision site. MAKE SURE YOU:   Understand these instructions.  Will watch your condition.  Will get help right away if you are not doing well or get worse. Document Released: 08/16/2006 Document Revised: 06/28/2011 Document Reviewed: 10/17/2006 ExitCare Patient Information 2014 ExitCare, [place].   ________________________________________________________________________  WHAT IS A BLOOD TRANSFUSION? Blood Transfusion Information  A transfusion is the replacement of blood or some of its parts. Blood is made up of multiple cells which provide different functions.  Red blood cells carry oxygen and are used for blood loss replacement.  White blood cells fight against infection.  Platelets control bleeding.  Plasma helps clot blood.  Other blood products are available for specialized needs, such as hemophilia or other clotting disorders. BEFORE THE TRANSFUSION  Who gives blood for transfusions?   Healthy volunteers who are fully evaluated to make sure their blood is safe. This is blood bank  blood. Transfusion therapy is the safest it has ever been in the practice of medicine. Before blood is taken from a donor, a complete history is taken to make sure that person  has no history of diseases nor engages in risky social behavior (examples are intravenous drug use or sexual activity with multiple partners). The donor's travel history is screened to minimize risk of transmitting infections, such as malaria. The donated blood is tested for signs of infectious diseases, such as HIV and hepatitis. The blood is then tested to be sure it is compatible with you in order to minimize the chance of a transfusion reaction. If you or a relative donates blood, this is often done in anticipation of surgery and is not appropriate for emergency situations. It takes many days to process the donated blood. RISKS AND COMPLICATIONS Although transfusion therapy is very safe and saves many lives, the main dangers of transfusion include:   Getting an infectious disease.  Developing a transfusion reaction. This is an allergic reaction to something in the blood you were given. Every precaution is taken to prevent this. The decision to have a blood transfusion has been considered carefully by your caregiver before blood is given. Blood is not given unless the benefits outweigh the risks. AFTER THE TRANSFUSION  Right after receiving a blood transfusion, you will usually feel much better and more energetic. This is especially true if your red blood cells have gotten low (anemic). The transfusion raises the level of the red blood cells which carry oxygen, and this usually causes an energy increase.  The nurse administering the transfusion will monitor you carefully for complications. HOME CARE INSTRUCTIONS  No special instructions are needed after a transfusion. You may find your energy is better. Speak with your caregiver about any limitations on activity for underlying diseases you may have. SEEK MEDICAL CARE IF:    Your condition is not improving after your transfusion.  You develop redness or irritation at the intravenous (IV) site. SEEK IMMEDIATE MEDICAL CARE IF:  Any of the following symptoms occur over the next 12 hours:  Shaking chills.  You have a temperature by mouth above 102 F (38.9 C), not controlled by medicine.  Chest, back, or muscle pain.  People around you feel you are not acting correctly or are confused.  Shortness of breath or difficulty breathing.  Dizziness and fainting.  You get a rash or develop hives.  You have a decrease in urine output.  Your urine turns a dark color or changes to pink, red, or brown. Any of the following symptoms occur over the next 10 days:  You have a temperature by mouth above 102 F (38.9 C), not controlled by medicine.  Shortness of breath.  Weakness after normal activity.  The white part of the eye turns yellow (jaundice).  You have a decrease in the amount of urine or are urinating less often.  Your urine turns a dark color or changes to pink, red, or brown. Document Released: 04/02/2000 Document Revised: 06/28/2011 Document Reviewed: 11/20/2007 [facility] Patient Information 2014 [place], [place].  _______________________________________________________________________

## 2017-07-14 ENCOUNTER — Telehealth: Payer: Self-pay

## 2017-07-14 ENCOUNTER — Other Ambulatory Visit: Payer: Self-pay

## 2017-07-14 ENCOUNTER — Encounter (HOSPITAL_COMMUNITY): Payer: Self-pay

## 2017-07-14 ENCOUNTER — Encounter (HOSPITAL_COMMUNITY)
Admission: RE | Admit: 2017-07-14 | Discharge: 2017-07-14 | Disposition: A | Discharge status: Home / Self Care | Payer: BLUE CROSS/BLUE SHIELD | Source: Ambulatory Visit | Attending: Gynecologic Oncology | Admitting: Gynecologic Oncology

## 2017-07-14 DIAGNOSIS — Z87891 Personal history of nicotine dependence: Secondary | ICD-10-CM | POA: Diagnosis not present

## 2017-07-14 DIAGNOSIS — Z01810 Encounter for preprocedural cardiovascular examination: Secondary | ICD-10-CM | POA: Insufficient documentation

## 2017-07-14 DIAGNOSIS — C541 Malignant neoplasm of endometrium: Secondary | ICD-10-CM | POA: Insufficient documentation

## 2017-07-14 DIAGNOSIS — E669 Obesity, unspecified: Secondary | ICD-10-CM | POA: Insufficient documentation

## 2017-07-14 DIAGNOSIS — Z7984 Long term (current) use of oral hypoglycemic drugs: Secondary | ICD-10-CM | POA: Diagnosis not present

## 2017-07-14 DIAGNOSIS — G4730 Sleep apnea, unspecified: Secondary | ICD-10-CM | POA: Diagnosis not present

## 2017-07-14 DIAGNOSIS — Z01812 Encounter for preprocedural laboratory examination: Secondary | ICD-10-CM | POA: Insufficient documentation

## 2017-07-14 DIAGNOSIS — I10 Essential (primary) hypertension: Secondary | ICD-10-CM | POA: Insufficient documentation

## 2017-07-14 DIAGNOSIS — E119 Type 2 diabetes mellitus without complications: Secondary | ICD-10-CM | POA: Diagnosis not present

## 2017-07-14 DIAGNOSIS — Z6839 Body mass index (BMI) 39.0-39.9, adult: Secondary | ICD-10-CM | POA: Diagnosis not present

## 2017-07-14 HISTORY — DX: Malignant neoplasm of endometrium: C54.1

## 2017-07-14 HISTORY — DX: Personal history of other diseases of the circulatory system: Z86.79

## 2017-07-14 HISTORY — DX: Presence of spectacles and contact lenses: Z97.3

## 2017-07-14 HISTORY — DX: Cardiac murmur, unspecified: R01.1

## 2017-07-14 HISTORY — DX: Exercise induced bronchospasm: J45.990

## 2017-07-14 HISTORY — DX: Obstructive sleep apnea (adult) (pediatric): G47.33

## 2017-07-14 HISTORY — DX: Type 2 diabetes mellitus without complications: E11.9

## 2017-07-14 HISTORY — DX: Unspecified osteoarthritis, unspecified site: M19.90

## 2017-07-14 LAB — COMPREHENSIVE METABOLIC PANEL
ALK PHOS: 77 U/L (ref 38–126)
ALT: 31 U/L (ref 14–54)
AST: 30 U/L (ref 15–41)
Albumin: 3.8 g/dL (ref 3.5–5.0)
Anion gap: 11 (ref 5–15)
BUN: 21 mg/dL — ABNORMAL HIGH (ref 6–20)
CALCIUM: 9.7 mg/dL (ref 8.9–10.3)
CO2: 25 mmol/L (ref 22–32)
CREATININE: 0.93 mg/dL (ref 0.44–1.00)
Chloride: 105 mmol/L (ref 101–111)
Glucose, Bld: 138 mg/dL — ABNORMAL HIGH (ref 65–99)
Potassium: 4.2 mmol/L (ref 3.5–5.1)
SODIUM: 141 mmol/L (ref 135–145)
Total Bilirubin: 0.6 mg/dL (ref 0.3–1.2)
Total Protein: 7.1 g/dL (ref 6.5–8.1)

## 2017-07-14 LAB — URINALYSIS, ROUTINE W REFLEX MICROSCOPIC
BILIRUBIN URINE: NEGATIVE
Glucose, UA: NEGATIVE mg/dL
HGB URINE DIPSTICK: NEGATIVE
KETONES UR: NEGATIVE mg/dL
NITRITE: NEGATIVE
PROTEIN: NEGATIVE mg/dL
Specific Gravity, Urine: 1.018 (ref 1.005–1.030)
pH: 6 (ref 5.0–8.0)

## 2017-07-14 LAB — CBC
HEMATOCRIT: 44.7 % (ref 36.0–46.0)
HEMOGLOBIN: 14.8 g/dL (ref 12.0–15.0)
MCH: 28.2 pg (ref 26.0–34.0)
MCHC: 33.1 g/dL (ref 30.0–36.0)
MCV: 85.1 fL (ref 78.0–100.0)
Platelets: 297 10*3/uL (ref 150–400)
RBC: 5.25 MIL/uL — ABNORMAL HIGH (ref 3.87–5.11)
RDW: 12.8 % (ref 11.5–15.5)
WBC: 6.8 10*3/uL (ref 4.0–10.5)

## 2017-07-14 LAB — ABO/RH: ABO/RH(D): A POS

## 2017-07-14 LAB — GLUCOSE, CAPILLARY: GLUCOSE-CAPILLARY: 170 mg/dL — AB (ref 65–99)

## 2017-07-14 NOTE — Procedures (Signed)
  [facility] Sleep @[facility] [place], [place] [address] NAME: Margaret [name]                                                          DOB: Aug 27, 1955 MEDICAL RECORD NUMBER   [id number]                                         DOS: 07/11/2017 REFERRING PHYSICIAN: Pricilla Holm, MD STUDY PERFORMED: Home Sleep Test HISTORY: 62 year old woman with a history of diabetes, irritable bowel syndrome, endometrial cancer, and obesity, who was previously diagnosed with obstructive sleep apnea and treated with CPAP therapy in the past. She was able to lose a significant amount of weight. Her Epworth sleepiness score is 0/24, BMI of 39.3.  STUDY RESULTS:  Total Recording Time:  8 hours, 10 minutes (valid test time: 7 hours, 58 minutes) Total Apnea/Hypopnea Index (AHI): 5.5/h, RDI: 6.2/h Average Oxygen Saturation: 94%, Lowest Oxygen Desaturation: 85%  Total Time Oxygen Saturation Below or at 88%: 2 minutes  Average Heart Rate: 75 bpm  IMPRESSION: OSA RECOMMENDATION: This home sleep test demonstrates overall mild or borderline obstructive sleep apnea with a total AHI of 5.5/hour and O2 nadir of 85%. Given the patient's medical history and sleep related complaints, treatment with positive airway pressure is recommended, especially, in light of planned surgery. Treatment can be achieved in the form of autoPAP trial/titration at home. A full night CPAP titration study will help with proper treatment settings and mask fitting if needed down the road. Please note that untreated obstructive sleep apnea may carry additional perioperative morbidity. Patients with significant obstructive sleep apnea should receive perioperative PAP therapy and the surgeons and particularly the anesthesiologist should be informed of the diagnosis and the severity of the sleep disordered breathing. Other causes of the patient's symptoms, including circadian rhythm disturbances, an underlying mood disorder,  medication effect and/or an underlying medical problem cannot be ruled out based on this test. Clinical correlation is recommended. The patient should be cautioned not to drive, work at heights, or operate dangerous or heavy equipment when tired or sleepy. Review and reiteration of good sleep hygiene measures should be pursued with any patient. The patient and his referring provider will be notified of the test results. The patient will be seen in follow up in sleep clinic at [facility].   I certify that I have reviewed the raw data recording prior to the issuance of this report in accordance with the standards of the American Academy of Sleep Medicine (AASM).  Star Age, MD, PhD [facility]) Diplomat, ABPN (Neurology and Sleep)

## 2017-07-14 NOTE — Telephone Encounter (Signed)
-----   Message from Star Age, MD sent at 07/14/2017  8:24 AM EDT ----- Patient referred by Dr. Sharlet Salina, seen by me on 06/08/17, HST on 07/11/17.    Please call and notify the patient that the recent home sleep test showed obstructive sleep apnea. OSA is overall mild, but worth treating to see if she feels better after treatment and also in light of her upcoming surgery (I believe she will have hysterectomy for her cancer Dx). To that end I recommend treatment for her OSA in the form of autoPAP, which means, that we don't have to bring her in for a sleep study with CPAP, but will let her try an autoPAP machine at home, through a DME company (of her choice, or as per insurance requirement). The DME representative will educate her on how to use the machine, how to put the mask on, etc. I have placed an order in the chart. Please send referral, talk to patient, send report to referring MD. We will need a FU in sleep clinic for 10 weeks post-PAP set up, please arrange that with me or one of our NPs. Thanks,   Star Age, MD, PhD [facility])

## 2017-07-14 NOTE — Progress Notes (Signed)
UA result dated 07-14-2017 routed to dr Denman George in epic. A1c result dated 06-16-2017 in epic.

## 2017-07-14 NOTE — Progress Notes (Signed)
Patient referred by Dr. Sharlet Salina, seen by me on 06/08/17, HST on 07/11/17.    Please call and notify the patient that the recent home sleep test showed obstructive sleep apnea. OSA is overall mild, but worth treating to see if she feels better after treatment and also in light of her upcoming surgery (I believe she will have hysterectomy for her cancer Dx). To that end I recommend treatment for her OSA in the form of autoPAP, which means, that we don't have to bring her in for a sleep study with CPAP, but will let her try an autoPAP machine at home, through a DME company (of her choice, or as per insurance requirement). The DME representative will educate her on how to use the machine, how to put the mask on, etc. I have placed an order in the chart. Please send referral, talk to patient, send report to referring MD. We will need a FU in sleep clinic for 10 weeks post-PAP set up, please arrange that with me or one of our NPs. Thanks,   Margaret Age, MD, PhD [facility])

## 2017-07-14 NOTE — Addendum Note (Signed)
Addended by: Star Age on: 07/14/2017 08:24 AM   Modules accepted: Orders

## 2017-07-14 NOTE — Telephone Encounter (Signed)
I called pt to discuss her sleep study results. No answer, no VM set up, will try again later. 

## 2017-07-15 ENCOUNTER — Encounter: Payer: Self-pay | Admitting: Neurology

## 2017-07-15 NOTE — Progress Notes (Signed)
Final EKG dated 07-14-2017 in epic.

## 2017-07-18 ENCOUNTER — Encounter (HOSPITAL_COMMUNITY): Payer: Self-pay | Admitting: Anesthesiology

## 2017-07-18 NOTE — Anesthesia Preprocedure Evaluation (Addendum)
Anesthesia Evaluation  Patient identified by MRN, date of birth, ID band Patient awake    Reviewed: Allergy & Precautions, NPO status , Patient's Chart, lab work & pertinent test results  Airway Mallampati: I       Dental no notable dental hx. (+) Teeth Intact   Pulmonary sleep apnea , former smoker,    Pulmonary exam normal        Cardiovascular hypertension, Pt. on medications Normal cardiovascular exam Rhythm:Regular Rate:Normal     Neuro/Psych    GI/Hepatic negative GI ROS, Neg liver ROS,   Endo/Other  diabetes, Type 2, Oral Hypoglycemic AgentsMorbid obesity  Renal/GU negative Renal ROS  negative genitourinary   Musculoskeletal   Abdominal (+) + obese,   Peds  Hematology   Anesthesia Other Findings   Reproductive/Obstetrics                            Anesthesia Physical Anesthesia Plan  ASA: III  Anesthesia Plan: General   Post-op Pain Management:    Induction: Intravenous  PONV Risk Score and Plan: 4 or greater and Ondansetron, Dexamethasone, Scopolamine patch - Pre-op and Promethazine  Airway Management Planned: Oral ETT  Additional Equipment:   Intra-op Plan:   Post-operative Plan: Extubation in OR  Informed Consent: I have reviewed the patients History and Physical, chart, labs and discussed the procedure including the risks, benefits and alternatives for the proposed anesthesia with the patient or authorized representative who has indicated his/her understanding and acceptance.   Dental advisory given  Plan Discussed with: CRNA and Surgeon  Anesthesia Plan Comments:        Anesthesia Quick Evaluation

## 2017-07-18 NOTE — Telephone Encounter (Signed)
I called pt. I advised pt that Dr. Rexene Alberts reviewed their sleep study results and found that pt has mild osa. Dr. Rexene Alberts recommends that pt start an auto pap. I reviewed PAP compliance expectations with the pt. Pt is agreeable to starting an auto-PAP. I advised pt that an order will be sent to a DME, [facility], and [facility] will call the pt within about one week after they file with the pt's insurance. [facility] will show the pt how to use the machine, fit for masks, and troubleshoot the auto-PAP if needed. A follow up appt was made for insurance purposes with Dr. Rexene Alberts on 10/27/17 at 8:30am. Pt verbalized understanding to arrive 15 minutes early and bring their auto-PAP. A letter with all of this information in it will be sent to the pt's mychart as a reminder. I verified with the pt that the address we have on file is correct. Pt verbalized understanding of results. Pt had no questions at this time but was encouraged to call back if questions arise.

## 2017-07-18 NOTE — Telephone Encounter (Signed)
I called pt to discuss, no answer, no VM set up, will try again later.

## 2017-07-19 ENCOUNTER — Encounter (HOSPITAL_COMMUNITY)
Admission: RE | Disposition: A | Discharge status: Home / Self Care | Payer: Self-pay | Source: Ambulatory Visit | Attending: Gynecologic Oncology

## 2017-07-19 ENCOUNTER — Ambulatory Visit (HOSPITAL_COMMUNITY): Payer: BLUE CROSS/BLUE SHIELD | Admitting: Anesthesiology

## 2017-07-19 ENCOUNTER — Other Ambulatory Visit: Payer: Self-pay

## 2017-07-19 ENCOUNTER — Ambulatory Visit (HOSPITAL_COMMUNITY)
Admission: RE | Admit: 2017-07-19 | Discharge: 2017-07-20 | Disposition: A | Discharge status: Home / Self Care | Payer: BLUE CROSS/BLUE SHIELD | Source: Ambulatory Visit | Attending: Gynecologic Oncology | Admitting: Gynecologic Oncology

## 2017-07-19 ENCOUNTER — Encounter (HOSPITAL_COMMUNITY): Payer: Self-pay | Admitting: *Deleted

## 2017-07-19 DIAGNOSIS — E669 Obesity, unspecified: Secondary | ICD-10-CM | POA: Diagnosis present

## 2017-07-19 DIAGNOSIS — Z7984 Long term (current) use of oral hypoglycemic drugs: Secondary | ICD-10-CM | POA: Insufficient documentation

## 2017-07-19 DIAGNOSIS — Z8249 Family history of ischemic heart disease and other diseases of the circulatory system: Secondary | ICD-10-CM | POA: Insufficient documentation

## 2017-07-19 DIAGNOSIS — E119 Type 2 diabetes mellitus without complications: Secondary | ICD-10-CM | POA: Diagnosis not present

## 2017-07-19 DIAGNOSIS — C775 Secondary and unspecified malignant neoplasm of intrapelvic lymph nodes: Secondary | ICD-10-CM | POA: Insufficient documentation

## 2017-07-19 DIAGNOSIS — N800 Endometriosis of uterus: Secondary | ICD-10-CM | POA: Diagnosis not present

## 2017-07-19 DIAGNOSIS — E118 Type 2 diabetes mellitus with unspecified complications: Secondary | ICD-10-CM | POA: Diagnosis present

## 2017-07-19 DIAGNOSIS — Z79899 Other long term (current) drug therapy: Secondary | ICD-10-CM | POA: Diagnosis not present

## 2017-07-19 DIAGNOSIS — C541 Malignant neoplasm of endometrium: Secondary | ICD-10-CM | POA: Diagnosis not present

## 2017-07-19 DIAGNOSIS — Z791 Long term (current) use of non-steroidal anti-inflammatories (NSAID): Secondary | ICD-10-CM | POA: Insufficient documentation

## 2017-07-19 DIAGNOSIS — Z888 Allergy status to other drugs, medicaments and biological substances status: Secondary | ICD-10-CM | POA: Diagnosis not present

## 2017-07-19 DIAGNOSIS — F329 Major depressive disorder, single episode, unspecified: Secondary | ICD-10-CM | POA: Diagnosis not present

## 2017-07-19 DIAGNOSIS — Z87891 Personal history of nicotine dependence: Secondary | ICD-10-CM | POA: Insufficient documentation

## 2017-07-19 DIAGNOSIS — G4730 Sleep apnea, unspecified: Secondary | ICD-10-CM | POA: Diagnosis not present

## 2017-07-19 DIAGNOSIS — Z79890 Hormone replacement therapy (postmenopausal): Secondary | ICD-10-CM | POA: Insufficient documentation

## 2017-07-19 DIAGNOSIS — D070 Carcinoma in situ of endometrium: Secondary | ICD-10-CM | POA: Diagnosis not present

## 2017-07-19 DIAGNOSIS — Z6839 Body mass index (BMI) 39.0-39.9, adult: Secondary | ICD-10-CM | POA: Insufficient documentation

## 2017-07-19 DIAGNOSIS — Z880 Allergy status to penicillin: Secondary | ICD-10-CM | POA: Insufficient documentation

## 2017-07-19 DIAGNOSIS — E1169 Type 2 diabetes mellitus with other specified complication: Secondary | ICD-10-CM | POA: Diagnosis present

## 2017-07-19 DIAGNOSIS — D259 Leiomyoma of uterus, unspecified: Secondary | ICD-10-CM | POA: Insufficient documentation

## 2017-07-19 DIAGNOSIS — D4989 Neoplasm of unspecified behavior of other specified sites: Secondary | ICD-10-CM | POA: Diagnosis not present

## 2017-07-19 DIAGNOSIS — Z8542 Personal history of malignant neoplasm of other parts of uterus: Secondary | ICD-10-CM

## 2017-07-19 DIAGNOSIS — C543 Malignant neoplasm of fundus uteri: Secondary | ICD-10-CM | POA: Diagnosis not present

## 2017-07-19 DIAGNOSIS — R011 Cardiac murmur, unspecified: Secondary | ICD-10-CM | POA: Diagnosis not present

## 2017-07-19 HISTORY — PX: ROBOTIC ASSISTED SUPRACERVICAL HYSTERECTOMY WITH BILATERAL SALPINGO OOPHERECTOMY: SHX6084

## 2017-07-19 HISTORY — PX: SENTINEL NODE BIOPSY: SHX6608

## 2017-07-19 LAB — GLUCOSE, CAPILLARY
GLUCOSE-CAPILLARY: 242 mg/dL — AB (ref 65–99)
GLUCOSE-CAPILLARY: 312 mg/dL — AB (ref 65–99)
Glucose-Capillary: 282 mg/dL — ABNORMAL HIGH (ref 65–99)
Glucose-Capillary: 246 mg/dL — ABNORMAL HIGH (ref 65–99)
Glucose-Capillary: 240 mg/dL — ABNORMAL HIGH (ref 65–99)

## 2017-07-19 LAB — TYPE AND SCREEN
ABO/RH(D): A POS
ANTIBODY SCREEN: NEGATIVE

## 2017-07-19 SURGERY — HYSTERECTOMY, SUPRACERVICAL, ROBOT-ASSISTED, LAPAROSCOPIC, WITH BILATERAL SALPINGECTOMY
Anesthesia: General

## 2017-07-19 MED ORDER — ONDANSETRON HCL 4 MG/2ML IJ SOLN
INTRAMUSCULAR | Status: AC
  Filled 2017-07-19: qty 4

## 2017-07-19 MED ORDER — ROCURONIUM BROMIDE 10 MG/ML (PF) SYRINGE
PREFILLED_SYRINGE | INTRAVENOUS | Status: DC | PRN
  Administered 2017-07-19 (×2): 10 mg via INTRAVENOUS
  Administered 2017-07-19: 50 mg via INTRAVENOUS

## 2017-07-19 MED ORDER — SUGAMMADEX SODIUM 500 MG/5ML IV SOLN
INTRAVENOUS | Status: DC | PRN
  Administered 2017-07-19: 413.6 mg via INTRAVENOUS

## 2017-07-19 MED ORDER — KETOROLAC TROMETHAMINE 30 MG/ML IJ SOLN
INTRAMUSCULAR | Status: AC
  Administered 2017-07-19: 30 mg via INTRAVENOUS
  Filled 2017-07-19: qty 1

## 2017-07-19 MED ORDER — MEPERIDINE HCL 50 MG/ML IJ SOLN: 6.2500 mg | INTRAMUSCULAR | Status: DC | PRN

## 2017-07-19 MED ORDER — KCL IN DEXTROSE-NACL 20-5-0.45 MEQ/L-%-% IV SOLN
INTRAVENOUS | Status: DC
  Administered 2017-07-19: 11:00:00 via INTRAVENOUS
  Administered 2017-07-20: 1000 mL via INTRAVENOUS
  Filled 2017-07-19 (×2): qty 1000

## 2017-07-19 MED ORDER — FENTANYL CITRATE (PF) 100 MCG/2ML IJ SOLN
INTRAMUSCULAR | Status: DC | PRN
  Administered 2017-07-19: 50 ug via INTRAVENOUS
  Administered 2017-07-19 (×2): 25 ug via INTRAVENOUS

## 2017-07-19 MED ORDER — ONDANSETRON HCL 4 MG/2ML IJ SOLN: 4.0000 mg | Freq: Four times a day (QID) | INTRAMUSCULAR | Status: DC | PRN

## 2017-07-19 MED ORDER — SUGAMMADEX SODIUM 200 MG/2ML IV SOLN
INTRAVENOUS | Status: AC
  Filled 2017-07-19: qty 2

## 2017-07-19 MED ORDER — MIDAZOLAM HCL 2 MG/2ML IJ SOLN
INTRAMUSCULAR | Status: AC
  Filled 2017-07-19: qty 2

## 2017-07-19 MED ORDER — GABAPENTIN 300 MG PO CAPS
300.0000 mg | ORAL_CAPSULE | ORAL | Status: AC
  Administered 2017-07-19: 300 mg via ORAL
  Filled 2017-07-19: qty 1

## 2017-07-19 MED ORDER — ACETAMINOPHEN 500 MG PO TABS
1000.0000 mg | ORAL_TABLET | Freq: Four times a day (QID) | ORAL | Status: DC
  Administered 2017-07-19 – 2017-07-20 (×4): 1000 mg via ORAL
  Filled 2017-07-19 (×4): qty 2

## 2017-07-19 MED ORDER — LACTATED RINGERS IV SOLN
INTRAVENOUS | Status: AC | PRN
  Administered 2017-07-19: 1000 mL

## 2017-07-19 MED ORDER — LACTATED RINGERS IV SOLN
INTRAVENOUS | Status: DC
  Administered 2017-07-19: 07:00:00 via INTRAVENOUS

## 2017-07-19 MED ORDER — PROPOFOL 10 MG/ML IV BOLUS
INTRAVENOUS | Status: DC | PRN
  Administered 2017-07-19: 200 mg via INTRAVENOUS

## 2017-07-19 MED ORDER — SCOPOLAMINE 1 MG/3DAYS TD PT72
1.0000 | MEDICATED_PATCH | TRANSDERMAL | Status: DC
  Administered 2017-07-19: 1.5 mg via TRANSDERMAL
  Filled 2017-07-19: qty 1

## 2017-07-19 MED ORDER — STERILE WATER FOR IRRIGATION IR SOLN
Status: DC | PRN
  Administered 2017-07-19: 1000 mL

## 2017-07-19 MED ORDER — HYDROMORPHONE HCL 1 MG/ML IJ SOLN
INTRAMUSCULAR | Status: AC
  Administered 2017-07-19: 0.25 mg via INTRAVENOUS
  Filled 2017-07-19: qty 2

## 2017-07-19 MED ORDER — HYDROMORPHONE HCL 1 MG/ML IJ SOLN
0.2000 mg | INTRAMUSCULAR | Status: DC | PRN
  Administered 2017-07-19: 0.5 mg via INTRAVENOUS
  Filled 2017-07-19: qty 1

## 2017-07-19 MED ORDER — DEXAMETHASONE SODIUM PHOSPHATE 10 MG/ML IJ SOLN
INTRAMUSCULAR | Status: AC
  Filled 2017-07-19: qty 2

## 2017-07-19 MED ORDER — ENOXAPARIN SODIUM 40 MG/0.4ML ~~LOC~~ SOLN
40.0000 mg | SUBCUTANEOUS | Status: DC
  Administered 2017-07-20: 40 mg via SUBCUTANEOUS
  Filled 2017-07-19: qty 0.4

## 2017-07-19 MED ORDER — ONDANSETRON HCL 4 MG/2ML IJ SOLN
INTRAMUSCULAR | Status: DC | PRN
  Administered 2017-07-19: 4 mg via INTRAVENOUS

## 2017-07-19 MED ORDER — IBUPROFEN 200 MG PO TABS
600.0000 mg | ORAL_TABLET | Freq: Four times a day (QID) | ORAL | Status: DC
  Administered 2017-07-20: 600 mg via ORAL
  Filled 2017-07-19: qty 3

## 2017-07-19 MED ORDER — DEXAMETHASONE SODIUM PHOSPHATE 4 MG/ML IJ SOLN: 4.0000 mg | INTRAMUSCULAR | Status: DC

## 2017-07-19 MED ORDER — LIDOCAINE 2% (20 MG/ML) 5 ML SYRINGE
INTRAMUSCULAR | Status: DC | PRN
  Administered 2017-07-19: 80 mg via INTRAVENOUS

## 2017-07-19 MED ORDER — SENNOSIDES-DOCUSATE SODIUM 8.6-50 MG PO TABS
2.0000 | ORAL_TABLET | Freq: Every day | ORAL | Status: DC
  Administered 2017-07-19: 2 via ORAL
  Filled 2017-07-19: qty 2

## 2017-07-19 MED ORDER — STERILE WATER FOR INJECTION IJ SOLN
INTRAMUSCULAR | Status: DC | PRN
  Administered 2017-07-19: 4 mL

## 2017-07-19 MED ORDER — KETOROLAC TROMETHAMINE 30 MG/ML IJ SOLN
30.0000 mg | Freq: Once | INTRAMUSCULAR | Status: AC | PRN
  Administered 2017-07-19: 30 mg via INTRAVENOUS

## 2017-07-19 MED ORDER — DEXAMETHASONE SODIUM PHOSPHATE 10 MG/ML IJ SOLN
INTRAMUSCULAR | Status: AC
  Filled 2017-07-19: qty 1

## 2017-07-19 MED ORDER — DEXAMETHASONE SODIUM PHOSPHATE 10 MG/ML IJ SOLN
INTRAMUSCULAR | Status: DC | PRN
  Administered 2017-07-19: 10 mg via INTRAVENOUS

## 2017-07-19 MED ORDER — ONDANSETRON HCL 4 MG/2ML IJ SOLN
INTRAMUSCULAR | Status: AC
  Filled 2017-07-19: qty 2

## 2017-07-19 MED ORDER — INSULIN ASPART 100 UNIT/ML ~~LOC~~ SOLN
0.0000 [IU] | Freq: Three times a day (TID) | SUBCUTANEOUS | Status: DC
  Administered 2017-07-19: 5 [IU] via SUBCUTANEOUS
  Administered 2017-07-19: 11 [IU] via SUBCUTANEOUS
  Administered 2017-07-20: 5 [IU] via SUBCUTANEOUS

## 2017-07-19 MED ORDER — TRAMADOL HCL 50 MG PO TABS
100.0000 mg | ORAL_TABLET | Freq: Four times a day (QID) | ORAL | Status: DC | PRN
  Administered 2017-07-19: 100 mg via ORAL
  Filled 2017-07-19: qty 2

## 2017-07-19 MED ORDER — INSULIN GLARGINE 100 UNIT/ML ~~LOC~~ SOLN
20.0000 [IU] | Freq: Every day | SUBCUTANEOUS | Status: DC
  Administered 2017-07-19: 20 [IU] via SUBCUTANEOUS
  Filled 2017-07-19 (×2): qty 0.2

## 2017-07-19 MED ORDER — CIPROFLOXACIN IN D5W 400 MG/200ML IV SOLN
400.0000 mg | INTRAVENOUS | Status: AC
  Administered 2017-07-19: 400 mg via INTRAVENOUS
  Filled 2017-07-19: qty 200

## 2017-07-19 MED ORDER — FENTANYL CITRATE (PF) 100 MCG/2ML IJ SOLN
INTRAMUSCULAR | Status: AC
  Filled 2017-07-19: qty 2

## 2017-07-19 MED ORDER — ACETAMINOPHEN 500 MG PO TABS
1000.0000 mg | ORAL_TABLET | ORAL | Status: DC
  Filled 2017-07-19: qty 2

## 2017-07-19 MED ORDER — LACTATED RINGERS IV SOLN
INTRAVENOUS | Status: DC | PRN
  Administered 2017-07-19: 07:00:00 via INTRAVENOUS

## 2017-07-19 MED ORDER — CLINDAMYCIN PHOSPHATE 900 MG/50ML IV SOLN
900.0000 mg | INTRAVENOUS | Status: AC
  Administered 2017-07-19: 900 mg via INTRAVENOUS
  Filled 2017-07-19: qty 50

## 2017-07-19 MED ORDER — ENOXAPARIN SODIUM 40 MG/0.4ML ~~LOC~~ SOLN
40.0000 mg | SUBCUTANEOUS | Status: AC
  Administered 2017-07-19: 40 mg via SUBCUTANEOUS
  Filled 2017-07-19: qty 0.4

## 2017-07-19 MED ORDER — ONDANSETRON HCL 4 MG PO TABS: 4.0000 mg | ORAL_TABLET | Freq: Four times a day (QID) | ORAL | Status: DC | PRN

## 2017-07-19 MED ORDER — PROPOFOL 10 MG/ML IV BOLUS
INTRAVENOUS | Status: AC
  Filled 2017-07-19: qty 40

## 2017-07-19 MED ORDER — LISINOPRIL 5 MG PO TABS
2.5000 mg | ORAL_TABLET | Freq: Every day | ORAL | Status: DC
  Filled 2017-07-19: qty 1

## 2017-07-19 MED ORDER — LIDOCAINE 2% (20 MG/ML) 5 ML SYRINGE
INTRAMUSCULAR | Status: AC
  Filled 2017-07-19: qty 15

## 2017-07-19 MED ORDER — SODIUM CHLORIDE 0.9 % IJ SOLN
INTRAMUSCULAR | Status: AC
  Filled 2017-07-19: qty 10

## 2017-07-19 MED ORDER — CELECOXIB 200 MG PO CAPS
400.0000 mg | ORAL_CAPSULE | ORAL | Status: AC
  Administered 2017-07-19: 400 mg via ORAL
  Filled 2017-07-19: qty 2

## 2017-07-19 MED ORDER — OXYCODONE HCL 5 MG PO TABS: 5.0000 mg | ORAL_TABLET | ORAL | Status: DC | PRN

## 2017-07-19 MED ORDER — MIDAZOLAM HCL 5 MG/5ML IJ SOLN
INTRAMUSCULAR | Status: DC | PRN
  Administered 2017-07-19: 2 mg via INTRAVENOUS

## 2017-07-19 MED ORDER — ROCURONIUM BROMIDE 10 MG/ML (PF) SYRINGE
PREFILLED_SYRINGE | INTRAVENOUS | Status: AC
  Filled 2017-07-19: qty 15

## 2017-07-19 MED ORDER — PROMETHAZINE HCL 25 MG/ML IJ SOLN: 6.2500 mg | INTRAMUSCULAR | Status: DC | PRN

## 2017-07-19 MED ORDER — SUGAMMADEX SODIUM 500 MG/5ML IV SOLN
INTRAVENOUS | Status: AC
  Filled 2017-07-19: qty 5

## 2017-07-19 MED ORDER — GABAPENTIN 300 MG PO CAPS
600.0000 mg | ORAL_CAPSULE | Freq: Every day | ORAL | Status: AC
  Administered 2017-07-19: 600 mg via ORAL
  Filled 2017-07-19: qty 2

## 2017-07-19 MED ORDER — HYDROMORPHONE HCL 1 MG/ML IJ SOLN
0.2500 mg | INTRAMUSCULAR | Status: DC | PRN
  Administered 2017-07-19: 0.5 mg via INTRAVENOUS
  Administered 2017-07-19: 0.25 mg via INTRAVENOUS

## 2017-07-19 SURGICAL SUPPLY — 59 items
ADH SKN CLS APL DERMABOND .7 (GAUZE/BANDAGES/DRESSINGS) ×1
BAG LAPAROSCOPIC 12 15 PORT 16 (BASKET) IMPLANT
BAG RETRIEVAL 12/15 (BASKET)
BAG SPEC RTRVL LRG 6X4 10 (ENDOMECHANICALS)
CHLORAPREP W/TINT 26ML (MISCELLANEOUS) ×2 IMPLANT
COVER SURGICAL LIGHT HANDLE (MISCELLANEOUS) ×2 IMPLANT
COVER TIP SHEARS 8 DVNC (MISCELLANEOUS) ×1 IMPLANT
COVER TIP SHEARS 8MM DA VINCI (MISCELLANEOUS) ×1
DERMABOND ADVANCED (GAUZE/BANDAGES/DRESSINGS) ×1
DERMABOND ADVANCED .7 DNX12 (GAUZE/BANDAGES/DRESSINGS) ×1 IMPLANT
DRAPE ARM DVNC X/XI (DISPOSABLE) ×4 IMPLANT
DRAPE COLUMN DVNC XI (DISPOSABLE) ×1 IMPLANT
DRAPE DA VINCI XI ARM (DISPOSABLE) ×4
DRAPE DA VINCI XI COLUMN (DISPOSABLE) ×1
DRAPE SHEET LG 3/4 BI-LAMINATE (DRAPES) ×4 IMPLANT
DRAPE SURG IRRIG POUCH 19X23 (DRAPES) ×2 IMPLANT
ELECT REM PT RETURN 15FT ADLT (MISCELLANEOUS) ×2 IMPLANT
GLOVE BIO SURGEON STRL SZ 6 (GLOVE) ×8 IMPLANT
GLOVE BIO SURGEON STRL SZ 6.5 (GLOVE) ×4 IMPLANT
GOWN STRL REUS W/ TWL LRG LVL3 (GOWN DISPOSABLE) ×3 IMPLANT
GOWN STRL REUS W/TWL LRG LVL3 (GOWN DISPOSABLE) ×6
HOLDER FOLEY CATH W/STRAP (MISCELLANEOUS) ×2 IMPLANT
IRRIG SUCT STRYKERFLOW 2 WTIP (MISCELLANEOUS) ×2
IRRIGATION SUCT STRKRFLW 2 WTP (MISCELLANEOUS) ×1 IMPLANT
KIT BASIN OR (CUSTOM PROCEDURE TRAY) ×2 IMPLANT
KIT PROCEDURE DA VINCI SI (MISCELLANEOUS) ×1
KIT PROCEDURE DVNC SI (MISCELLANEOUS) IMPLANT
MANIPULATOR UTERINE 4.5 ZUMI (MISCELLANEOUS) ×2 IMPLANT
MARKER SKIN DUAL TIP RULER LAB (MISCELLANEOUS) ×2 IMPLANT
NDL SAFETY ECLIPSE 18X1.5 (NEEDLE) IMPLANT
NDL SPNL 18GX3.5 QUINCKE PK (NEEDLE) IMPLANT
NEEDLE HYPO 18GX1.5 SHARP (NEEDLE) ×2
NEEDLE SPNL 18GX3.5 QUINCKE PK (NEEDLE) ×2 IMPLANT
OBTURATOR OPTICAL STANDARD 8MM (TROCAR) ×1
OBTURATOR OPTICAL STND 8 DVNC (TROCAR) ×1
OBTURATOR OPTICALSTD 8 DVNC (TROCAR) ×1 IMPLANT
OCCLUDER COLPOPNEUMO (BALLOONS) ×2 IMPLANT
PAD POSITIONING PINK XL (MISCELLANEOUS) ×2 IMPLANT
PORT ACCESS TROCAR AIRSEAL 12 (TROCAR) ×1 IMPLANT
PORT ACCESS TROCAR AIRSEAL 5M (TROCAR) ×1
POUCH SPECIMEN RETRIEVAL 10MM (ENDOMECHANICALS) IMPLANT
SEAL CANN UNIV 5-8 DVNC XI (MISCELLANEOUS) ×4 IMPLANT
SEAL XI 5MM-8MM UNIVERSAL (MISCELLANEOUS) ×4
SET TRI-LUMEN FLTR TB AIRSEAL (TUBING) ×2 IMPLANT
SHEET LAVH (DRAPES) ×2 IMPLANT
SOLUTION ELECTROLUBE (MISCELLANEOUS) ×2 IMPLANT
SUT MNCRL AB 4-0 PS2 18 (SUTURE) ×4 IMPLANT
SUT VIC AB 0 CT1 27 (SUTURE) ×2
SUT VIC AB 0 CT1 27XBRD ANTBC (SUTURE) ×1 IMPLANT
SYR 10ML LL (SYRINGE) IMPLANT
SYR 50ML LL SCALE MARK (SYRINGE) ×2 IMPLANT
TOWEL OR 17X26 10 PK STRL BLUE (TOWEL DISPOSABLE) ×4 IMPLANT
TOWEL OR NON WOVEN STRL DISP B (DISPOSABLE) ×2 IMPLANT
TRAP SPECIMEN MUCOUS 40CC (MISCELLANEOUS) IMPLANT
TRAY FOLEY W/METER SILVER 16FR (SET/KITS/TRAYS/PACK) ×2 IMPLANT
TRAY LAPAROSCOPIC (CUSTOM PROCEDURE TRAY) ×2 IMPLANT
TROCAR BLADELESS OPT 5 100 (ENDOMECHANICALS) ×2 IMPLANT
UNDERPAD 30X30 (UNDERPADS AND DIAPERS) ×2 IMPLANT
WATER STERILE IRR 1000ML POUR (IV SOLUTION) ×2 IMPLANT

## 2017-07-19 NOTE — Anesthesia Postprocedure Evaluation (Signed)
Anesthesia Post Note  Patient: Margaret [name]  Procedure(s) Performed: XI ROBOTIC ASSISTED  HYSTERECTOMY WITH BILATERAL SALPINGO OOPHORECTOMY; SENTINEL LYMPH NODE BIOPSY (N/A ) SENTINEL NODE BIOPSY (N/A )     Patient location during evaluation: PACU Anesthesia Type: General Level of consciousness: awake Pain management: pain level controlled Vital Signs Assessment: post-procedure vital signs reviewed and stable Respiratory status: spontaneous breathing Cardiovascular status: stable Postop Assessment: no apparent nausea or vomiting Anesthetic complications: no    Last Vitals:  Vitals:   07/19/17 1000 07/19/17 1024  BP: 121/87 128/78  Pulse: 68 69  Resp: 15 16  Temp: 36.5 C 36.6 C  SpO2: 94% 98%    Last Pain:  Vitals:   07/19/17 1000  TempSrc:   PainSc: Asleep   Pain Goal: Patients Stated Pain Goal: 4 (07/19/17 0550)               Quantavia Frith JR,JOHN Mateo Flow

## 2017-07-19 NOTE — Transfer of Care (Signed)
Immediate Anesthesia Transfer of Care Note  Patient: Margaret [name]  Procedure(s) Performed: XI ROBOTIC ASSISTED  HYSTERECTOMY WITH BILATERAL SALPINGO OOPHORECTOMY; SENTINEL LYMPH NODE BIOPSY (N/A ) SENTINEL NODE BIOPSY (N/A )  Patient Location: PACU  Anesthesia Type:General  Level of Consciousness: awake, alert  and oriented  Airway & Oxygen Therapy: Patient Spontanous Breathing and Patient connected to face mask oxygen  Post-op Assessment: Report given to RN  Post vital signs: Reviewed and stable  Last Vitals:  Vitals Value Taken Time  BP 147/83 07/19/2017  9:25 AM  Temp    Pulse 69 07/19/2017  9:26 AM  Resp 16 07/19/2017  9:26 AM  SpO2 88 % 07/19/2017  9:26 AM  Vitals shown include unvalidated device data.  Last Pain:  Vitals:   07/19/17 0550  TempSrc: Oral      Patients Stated Pain Goal: 4 (74/12/87 8676)  Complications: No apparent anesthesia complications

## 2017-07-19 NOTE — Anesthesia Procedure Notes (Signed)
Procedure Name: Intubation Date/Time: 07/19/2017 7:28 AM Performed by: Lavina Hamman, CRNA Pre-anesthesia Checklist: Patient identified, Emergency Drugs available, Suction available, Patient being monitored and Timeout performed Patient Re-evaluated:Patient Re-evaluated prior to induction Oxygen Delivery Method: Circle system utilized Preoxygenation: Pre-oxygenation with 100% oxygen Induction Type: IV induction Ventilation: Mask ventilation without difficulty Laryngoscope Size: Mac and 4 Grade View: Grade I Tube type: Oral Tube size: 7.0 mm Number of attempts: 1 Airway Equipment and Method: Stylet Placement Confirmation: ETT inserted through vocal cords under direct vision,  positive ETCO2,  CO2 detector and breath sounds checked- equal and bilateral Secured at: 21 cm Tube secured with: Tape Dental Injury: Teeth and Oropharynx as per pre-operative assessment

## 2017-07-19 NOTE — Op Note (Signed)
OPERATIVE NOTE 07/19/17  Surgeon: Donaciano Eva   Assistants: Dr Lahoma Crocker (an MD assistant was necessary for tissue manipulation, management of robotic instrumentation, retraction and positioning due to the complexity of the case and hospital policies).   Anesthesia: General endotracheal anesthesia  ASA Class: 3   Pre-operative Diagnosis: endometrial cancer in situ (EIN)  Post-operative Diagnosis: same,   Operation: Robotic-assisted laparoscopic total hysterectomy with bilateral salpingoophorectomy, SLN biopsy   Surgeon: Donaciano Eva  Assistant Surgeon: Lahoma Crocker MD  Anesthesia: GET  Urine Output: 300  Operative Findings:  : 8cm uterus, grossly normal tubes and ovaries, normal appendix, no apparent metastatic disease.  Estimated Blood Loss:  less than 50 mL      Total IV Fluids: 1,000 ml         Specimens: uterus, cervix, bilateral tubes and ovaries, right obturator SLN, left external iliac SLN         Complications:  None; patient tolerated the procedure well.         Disposition: PACU - hemodynamically stable.  Procedure Details  The patient was seen in the Holding Room. The risks, benefits, complications, treatment options, and expected outcomes were discussed with the patient.  The patient concurred with the proposed plan, giving informed consent.  The site of surgery properly noted/marked. The patient was identified as Margaret [name] and the procedure verified as a Robotic-assisted hysterectomy with bilateral salpingo oophorectomy with SLN biopsy. A Time Out was held and the above information confirmed.  After induction of anesthesia, the patient was draped and prepped in the usual sterile manner. Pt was placed in supine position after anesthesia and draped and prepped in the usual sterile manner. The abdominal drape was placed after the CholoraPrep had been allowed to dry for 3 minutes.  Her arms were tucked to her side with all  appropriate precautions.  The shoulders were stabilized with padded shoulder blocks applied to the acromium processes.  The patient was placed in the semi-lithotomy position in [place].  The perineum was prepped with Betadine. The patient was then prepped. Foley catheter was placed.  A sterile speculum was placed in the vagina.  The cervix was grasped with a single-tooth tenaculum. 2mg  total of ICG was injected into the cervical stroma at 2 and 9 o'clock with 1cc injected at a 1cm and 50mm depth (concentration 0.5mg /ml) in all locations. The cervix was dilated with Kennon Rounds dilators.  The ZUMI uterine manipulator with a medium colpotomizer ring was placed without difficulty.  A pneum occluder balloon was placed over the manipulator.  OG tube placement was confirmed and to suction.   Next, a 5 mm skin incision was made 1 cm below the subcostal margin in the midclavicular line.  The 5 mm Optiview port and scope was used for direct entry.  Opening pressure was under 10 mm CO2.  The abdomen was insufflated and the findings were noted as above.   At this point and all points during the procedure, the patient's intra-abdominal pressure did not exceed 15 mmHg. Next, a 10 mm skin incision was made in the umbilicus and a right and left port was placed about 10 cm lateral to the robot port on the right and left side.  A fourth arm was placed in the left lower quadrant 2 cm above and superior and medial to the anterior superior iliac spine.  All ports were placed under direct visualization.  The patient was placed in steep Trendelenburg.  Bowel was folded away  into the upper abdomen.  The robot was docked in the normal manner.  The right and left peritoneum were opened parallel to the IP ligament to open the retroperitoneal spaces bilaterally. The SLN mapping was performed in bilateral pelvic basins. The para rectal and paravesical spaces were opened up entirely with careful dissection below the level of the ureters  bilaterally and to the depth of the uterine artery origin in order to skeletonize the uterine "web" and ensure visualization of all parametrial channels. The para-aortic basins were carefully exposed and evaluated for isolated para-aortic SLN's. Lymphatic channels were identified travelling to the following visualized sentinel lymph node's: right obturator, left external iliac. These SLN's were separated from their surrounding lymphatic tissue, removed and sent for permanent pathology.  The hysterectomy was started after the round ligament on the right side was incised and the retroperitoneum was entered and the pararectal space was developed.  The ureter was noted to be on the medial leaf of the broad ligament.  The peritoneum above the ureter was incised and stretched and the infundibulopelvic ligament was skeletonized, cauterized and cut.  The posterior peritoneum was taken down to the level of the KOH ring.  The anterior peritoneum was also taken down.  The bladder flap was created to the level of the KOH ring.  The uterine artery on the right side was skeletonized, cauterized and cut in the normal manner.  A similar procedure was performed on the left.  The colpotomy was made and the uterus, cervix, bilateral ovaries and tubes were amputated and delivered through the vagina.  Pedicles were inspected and excellent hemostasis was achieved.    The colpotomy at the vaginal cuff was closed with Vicryl on a CT1 needle in  manner.  Irrigation was used and excellent hemostasis was achieved.  At this point in the procedure was completed.  Robotic instruments were removed under direct visulaization.  The robot was undocked. The 10 mm ports were closed with Vicryl on a UR-5 needle and the fascia was closed with 0 Vicryl on a UR-5 needle.  The skin was closed with 4-0 Vicryl in a subcuticular manner.  Dermabond was applied.  Sponge, lap and needle counts correct x 2.  The patient was taken to the recovery room in  stable condition.  The vagina was swabbed with  minimal bleeding noted.   All instrument and needle counts were correct x  3.   The patient was transferred to the recovery room in a stable condition.  Donaciano Eva, MD

## 2017-07-19 NOTE — H&P (Signed)
Consult was requested by Dr. Stann Mainland for the evaluation of Margaret [name] 62 y.o. female  CC:     Chief Complaint  Patient presents with  . Endometrial carcinoma [facility])    Assessment/Plan:  Margaret [name] is a 62 y.o. with EIN and suspicion for malignancy.  She was counseled that the recommendation is for hysterectomy bilateral salpingo-oophorectomy with sentinel lymph node dissection.  I extensively reviewed the risks of robotic hysterectomy and possible lymph node dissection of infection, bleeding, damage to nearby organs including bowel, bladder, vessels, nerves, and ureters. We discussed postoperative risks including infection, PE/ DVT, and lymphedema. We reviewed possible need for conversion to open laparotomy, need for possible blood transfusion. I discussed positioning during surgery of trendelenberg and risks of minor facial swelling and care we take in preoperative positioning. We also reviewed possible need for further treatment such as radiation or chemotherapy based on final pathology. Expected postoperative recovery was also discussed.  She is aware that Dr. Willaim Sheng will be her surgeon   HPI: Margaret [name] is a 62 y.o. Gravida 1 para 1 last menstrual period at approximately 62 years of age.  She noted an episode of vaginal bleeding approximately 3 weeks ago pelvic ultrasound on May 24, 2017 notable for an endometrium that was thickened and hypervascular uterus measured 9.3 cm in greatest length of the left ovary was within normal limits comment was made regarding the right ovary.  A 1.6 cm fibroid was appreciated.  I do endometrial biopsy was collected and notable for a fragmented specimen with at least endometrial intraepithelial neoplasia and features highly suspicious of endometrioid carcinoma.  She has been on Micronase progesterone since the vaginal bleeding and takes 200 mg daily.  Margaret [name] states that the vaginal bleeding has markedly decreased she reports  15 pound weight loss in the last month. Her family history is unremarkable for associated malignancy.  Review of Systems:  Constitutional  Feels well, Cardiovascular  No chest pain, shortness of breath, or edema  Pulmonary  No cough or wheeze.  Gastro Intestinal  No nausea, vomitting, or diarrhoea. No bright red blood per rectum, no abdominal pain, change in bowel movement, or constipation.  Genito Urinary  No frequency, urgency, dysuria occasional vaginal spotting  Musculo Skeletal  No myalgia, arthralgia, joint swelling or pain  Neurologic  No weakness, numbness, change in gait,  Psychology  No depression, anxiety, insomnia.    Current Meds:      Outpatient Encounter Medications as of 06/16/2017  Medication Sig  . Calcium-Mag-Vit C-Vit D 185-28-2.5-200 CAPS   . Diclofenac Sodium (PENNSAID) 2 % SOLN Place 2 application 2 (two) times daily onto the skin.  Marland Kitchen lisinopril (PRINIVIL,ZESTRIL) 2.5 MG tablet TAKE 1 TABLET (2.5 MG TOTAL) DAILY BY MOUTH.  . metFORMIN (GLUCOPHAGE XR) 500 MG 24 hr tablet Take 1 tablet (500 mg total) by mouth daily with breakfast.  . Vitamin D, Ergocalciferol, (DRISDOL) 50000 units CAPS capsule TAKE 1 CAPSULE (50,000 UNITS TOTAL) EVERY 7 (SEVEN) DAYS BY MOUTH.  Marland Kitchen albuterol (PROVENTIL HFA;VENTOLIN HFA) 108 (90 Base) MCG/ACT inhaler Inhale 2 puffs into the lungs every 6 (six) hours as needed for wheezing or shortness of breath. (Patient not taking: Reported on 06/16/2017)  . [DISCONTINUED] vitamin B-12 (CYANOCOBALAMIN) 1000 MCG tablet Take 1,000 mcg by mouth daily. Reported on 10/01/2015   No facility-administered encounter medications on file as of 06/16/2017.     Allergy:       Allergies  Allergen Reactions  .  Amoxicillin     Hives  . Penicillins     Hives  . Paroxetine     Headache , malaise    Social Hx:   Social History        Socioeconomic History  . Marital status: Married    Spouse name: Not on file  . Number of  children: Not on file  . Years of education: Not on file  . Highest education level: Not on file  Social Needs  . Financial resource strain: Not on file  . Food insecurity - worry: Not on file  . Food insecurity - inability: Not on file  . Transportation needs - medical: Not on file  . Transportation needs - non-medical: Not on file  Occupational History  . Occupation: Engineer, manufacturing: [employer]  Tobacco Use  . Smoking status: Former Research scientist (life sciences)  . Smokeless tobacco: Never Used  . Tobacco comment: Smoked 16-19 ONLY   Substance and Sexual Activity  . Alcohol use: Yes    Comment: rarely  . Drug use: No  . Sexual activity: Not on file  Other Topics Concern  . Not on file  Social History Narrative  . Not on file    Past Surgical Hx:       Past Surgical History:  Procedure Laterality Date  . CHOLECYSTECTOMY    . colonoscopy with polypectomy     > 3; [facility]  . PLANTAR FASCIA RELEASE      Past Medical Hx:  Past Medical History:  Diagnosis Date  . Arrhythmia    HR 38 on one occasion when she went to give blood. Holter monitor (12/11) showed HR 40's- 137, average 68, PAC's< several 5-10 beat runs of atrial tachycardia versus AVNRT  . Dyspnea    Echo (12/11) with EF 17%, normal diastolic function, normal RV, normal PA pressures, normal vales. Stress echo showed hypertensive BP response with noevidence for ischemia or infarction by echo or ECG  . Gestational diabetes   . Hx of colonic polyps    x3  . IBS (irritable bowel syndrome)   . Other specified cardiac dysrhythmias(427.89)   . Sleep apnea    lost weight withthe resolution of symptoms but has gain weight back  Last colonoscopy 6 years ago with removal of polyps.  Last mammogram October 2018  Past Gynecological History: Menarche age 62 menses every month.  Reports significant dysmenorrhea until her 44s.  Menopause at age 62.   Reports oral contraceptive pill use for  over 20 years.  Denies a history of an abnormal Pap test  family Hx:       Family History  Problem Relation Age of Onset  . Colon polyps Father   . COPD Father   . Diabetes Father        diet controlled  . Heart attack Mother 1  . COPD Mother   . Lung cancer Mother   . Peripheral vascular disease Mother   . Hypertension Brother        2 bro  . Stroke Neg Hx     Vitals:  Blood pressure 118/84, pulse 75, temperature 99.5 F (37.5 C), temperature source Oral, resp. rate 18, height 5\' 4"  (1.626 m), weight 232 lb (105.2 kg), SpO2 98 %. Body mass index is 39.82 kg/m.   Physical Exam: WD in NAD Neck  Supple NROM, without any enlargements.  Lymph Node Survey No cervical supraclavicular or inguinal adenopathy Cardiovascular  Pulse normal  rate, regularity and rhythm. S1 and S2 normal.  Lungs  Clear to auscultation bilaterally, without wheezes/crackles/rhonchi. Good air movement.  Skin  No rash/lesions/breakdown  Psychiatry  Alert and oriented appropriate mood affect speech and reasoning. Abdomen  Normoactive bowel sounds, abdomen soft, non-tender.  Cystectomy surgical  sites intact without evidence of hernia.  Back No CVA tenderness Genito Urinary  Vulva/vagina: Normal external female genitalia.  No lesions. No discharge or bleeding.             Bladder/urethra:  No lesions or masses             Vagina: Blood in the vaginal vault      cervix: Normal appearing, no lesions.             Uterus: Mobile, no parametrial involvement or nodularity.             Adnexa: No palpable masses, examination limited by body habitus Rectal  Good tone, no masses no cul de sac nodularity.  Extremities  No bilateral cyanosis, clubbing or edema.    Thereasa Solo, MD

## 2017-07-20 ENCOUNTER — Encounter (HOSPITAL_COMMUNITY): Payer: Self-pay | Admitting: Gynecologic Oncology

## 2017-07-20 DIAGNOSIS — Z791 Long term (current) use of non-steroidal anti-inflammatories (NSAID): Secondary | ICD-10-CM | POA: Diagnosis not present

## 2017-07-20 DIAGNOSIS — G4730 Sleep apnea, unspecified: Secondary | ICD-10-CM | POA: Diagnosis not present

## 2017-07-20 DIAGNOSIS — E119 Type 2 diabetes mellitus without complications: Secondary | ICD-10-CM | POA: Diagnosis not present

## 2017-07-20 DIAGNOSIS — Z6839 Body mass index (BMI) 39.0-39.9, adult: Secondary | ICD-10-CM | POA: Diagnosis not present

## 2017-07-20 DIAGNOSIS — Z888 Allergy status to other drugs, medicaments and biological substances status: Secondary | ICD-10-CM | POA: Diagnosis not present

## 2017-07-20 DIAGNOSIS — N800 Endometriosis of uterus: Secondary | ICD-10-CM | POA: Diagnosis not present

## 2017-07-20 DIAGNOSIS — Z79899 Other long term (current) drug therapy: Secondary | ICD-10-CM | POA: Diagnosis not present

## 2017-07-20 DIAGNOSIS — Z8249 Family history of ischemic heart disease and other diseases of the circulatory system: Secondary | ICD-10-CM | POA: Diagnosis not present

## 2017-07-20 DIAGNOSIS — Z7984 Long term (current) use of oral hypoglycemic drugs: Secondary | ICD-10-CM | POA: Diagnosis not present

## 2017-07-20 DIAGNOSIS — C775 Secondary and unspecified malignant neoplasm of intrapelvic lymph nodes: Secondary | ICD-10-CM | POA: Diagnosis not present

## 2017-07-20 DIAGNOSIS — C541 Malignant neoplasm of endometrium: Secondary | ICD-10-CM | POA: Diagnosis not present

## 2017-07-20 DIAGNOSIS — Z880 Allergy status to penicillin: Secondary | ICD-10-CM | POA: Diagnosis not present

## 2017-07-20 DIAGNOSIS — Z79890 Hormone replacement therapy (postmenopausal): Secondary | ICD-10-CM | POA: Diagnosis not present

## 2017-07-20 DIAGNOSIS — Z87891 Personal history of nicotine dependence: Secondary | ICD-10-CM | POA: Diagnosis not present

## 2017-07-20 DIAGNOSIS — D259 Leiomyoma of uterus, unspecified: Secondary | ICD-10-CM | POA: Diagnosis not present

## 2017-07-20 LAB — CBC
HEMATOCRIT: 39.5 % (ref 36.0–46.0)
Hemoglobin: 13.3 g/dL (ref 12.0–15.0)
MCH: 28.2 pg (ref 26.0–34.0)
MCHC: 33.7 g/dL (ref 30.0–36.0)
MCV: 83.9 fL (ref 78.0–100.0)
Platelets: 294 10*3/uL (ref 150–400)
RBC: 4.71 MIL/uL (ref 3.87–5.11)
RDW: 12.6 % (ref 11.5–15.5)
WBC: 12.7 10*3/uL — ABNORMAL HIGH (ref 4.0–10.5)

## 2017-07-20 LAB — BASIC METABOLIC PANEL
Anion gap: 9 (ref 5–15)
BUN: 25 mg/dL — ABNORMAL HIGH (ref 6–20)
CALCIUM: 8.6 mg/dL — AB (ref 8.9–10.3)
CO2: 24 mmol/L (ref 22–32)
Chloride: 104 mmol/L (ref 101–111)
Creatinine, Ser: 1.04 mg/dL — ABNORMAL HIGH (ref 0.44–1.00)
GFR calc non Af Amer: 58 mL/min — ABNORMAL LOW (ref >60)
Glucose, Bld: 219 mg/dL — ABNORMAL HIGH (ref 65–99)
Potassium: 4.4 mmol/L (ref 3.5–5.1)
Sodium: 137 mmol/L (ref 135–145)

## 2017-07-20 LAB — GLUCOSE, CAPILLARY: Glucose-Capillary: 205 mg/dL — ABNORMAL HIGH (ref 65–99)

## 2017-07-20 NOTE — Discharge Summary (Signed)
Physician Discharge Summary  Patient ID: Margaret [name] MRN: [id number] DOB/AGE: 05/16/1955 62 y.o.  Admit date: 07/19/2017 Discharge date: 07/20/2017  Admission Diagnoses: Malignant endometrial neoplasm with intraepithelial neoplasia [facility])  Discharge Diagnoses:  Principal Problem:   Malignant endometrial neoplasm with intraepithelial neoplasia ([place]) Active Problems:   Diabetes mellitus type 2 in obese [facility])   Morbid obesity ([place])   Endometrial cancer ([place])   Discharged Condition:  The patient is in good condition and stable for discharge.    Hospital Course: On 07/19/2017, the patient underwent the following: Procedure(s): XI ROBOTIC ASSISTED  HYSTERECTOMY WITH BILATERAL SALPINGO OOPHORECTOMY; SENTINEL LYMPH NODE BIOPSY SENTINEL NODE BIOPSY.  The postoperative course was uneventful.  She was discharged to home on postoperative day 1 tolerating a regular diet, voiding, ambulating, pain controlled.  Consults: None  Significant Diagnostic Studies: None  Treatments: surgery: see above  Discharge Exam: Blood pressure 122/68, pulse 63, temperature 97.8 F (36.6 C), temperature source Oral, resp. rate 17, height 5\' 4"  (1.626 m), weight 228 lb (103.4 kg), SpO2 97 %. General appearance: alert, cooperative, appears stated age and no distress Resp: clear to auscultation bilaterally Cardio: regular rate and rhythm, S1, S2 normal, no murmur, click, rub or gallop GI: soft, non-tender; bowel sounds normal; no masses,  no organomegaly Extremities: extremities normal, atraumatic, no cyanosis or edema Incision/Wound: Lap sites to the abdomen with dermabond without erythema or drainage  Disposition: Discharge disposition: 01-Home or Self Care       Discharge Instructions    Call MD for:  difficulty breathing, headache or visual disturbances   Complete by:  As directed    Call MD for:  extreme fatigue   Complete by:  As directed    Call MD for:  hives   Complete by:  As directed     Call MD for:  persistant dizziness or light-headedness   Complete by:  As directed    Call MD for:  persistant nausea and vomiting   Complete by:  As directed    Call MD for:  redness, tenderness, or signs of infection (pain, swelling, redness, odor or green/yellow discharge around incision site)   Complete by:  As directed    Call MD for:  severe uncontrolled pain   Complete by:  As directed    Call MD for:  temperature >100.4   Complete by:  As directed    Diet - low sodium heart healthy   Complete by:  As directed    Driving Restrictions   Complete by:  As directed    No driving for 1 week.  Do not take narcotics and drive.   Increase activity slowly   Complete by:  As directed    Lifting restrictions   Complete by:  As directed    No lifting greater than 10 lbs.   Sexual Activity Restrictions   Complete by:  As directed    No sexual activity, nothing in the vagina, for 8 weeks.     Allergies as of 07/20/2017      Reactions   Amoxicillin Hives   Other Anaphylaxis   TREE NUTS   Penicillins    Hives Has patient had a PCN reaction causing immediate rash, facial/tongue/throat swelling, SOB or lightheadedness with hypotension: No Has patient had a PCN reaction causing severe rash involving mucus membranes or skin necrosis: No Has patient had a PCN reaction that required hospitalization: No Has patient had a PCN reaction occurring within the last 10 years: No If all  of the above answers are "NO", then may proceed with Cephalosporin use.   Paroxetine Other (See Comments)   Headache , malaise      Medication List    STOP taking these medications   progesterone 200 MG capsule Commonly known as:  PROMETRIUM     TAKE these medications   albuterol 108 (90 Base) MCG/ACT inhaler Commonly known as:  PROVENTIL HFA;VENTOLIN HFA Inhale 2 puffs into the lungs every 6 (six) hours as needed for wheezing or shortness of breath.   Calcium-Mag-Vit C-Vit D 185-28-2.5-200 Caps    Diclofenac Sodium 2 % Soln Commonly known as:  PENNSAID Place 2 application 2 (two) times daily onto the skin.   lisinopril 2.5 MG tablet Commonly known as:  PRINIVIL,ZESTRIL TAKE 1 TABLET (2.5 MG TOTAL) DAILY BY MOUTH.   metFORMIN 500 MG 24 hr tablet Commonly known as:  GLUCOPHAGE XR Take 1 tablet (500 mg total) by mouth daily with breakfast. What changed:  when to take this   naproxen sodium 220 MG tablet Commonly known as:  ALEVE Take 440 mg by mouth as needed (PAIN).   vitamin B-12 500 MCG tablet Commonly known as:  CYANOCOBALAMIN Take 500 mcg by mouth daily.   Vitamin D (Ergocalciferol) 50000 units Caps capsule Commonly known as:  DRISDOL TAKE 1 CAPSULE (50,000 UNITS TOTAL) EVERY 7 (SEVEN) DAYS BY MOUTH.      Follow-up Information    Everitt Amber, MD.   Specialty:  Obstetrics and Gynecology Contact information: [place] [address] [phone]           Greater than thirty minutes were spend for face to face discharge instructions and discharge orders/summary in EPIC.   Signed: Dorothyann Gibbs 07/20/2017, 10:09 AM

## 2017-07-20 NOTE — Discharge Instructions (Signed)
07/19/2017  Return to work: 4 weeks  Activity: 1. Be up and out of the bed during the day.  Take a nap if needed.  You may walk up steps but be careful and use the hand rail.  Stair climbing will tire you more than you think, you may need to stop part way and rest.   2. No lifting or straining for 6 weeks.  3. No driving for 1 weeks.  Do Not drive if you are taking narcotic pain medicine.  4. Shower daily.  Use soap and water on your incision and pat dry; don't rub.   5. No sexual activity and nothing in the vagina for 8 weeks.  Medications:  - Take ibuprofen and tylenol first line for pain control. Take these regularly (every 6 hours) to decrease the build up of pain.  - If necessary, for severe pain not relieved by ibuprofen, take percocet.  - While taking percocet you should take sennakot every night to reduce the likelihood of constipation. If this causes diarrhea, stop its use.  Diet: 1. Low sodium Heart Healthy Diet is recommended.  2. It is safe to use a laxative if you have difficulty moving your bowels.   Wound Care: 1. Keep clean and dry.  Shower daily.  Reasons to call the Doctor:   Fever - Oral temperature greater than 100.4 degrees Fahrenheit  Foul-smelling vaginal discharge  Difficulty urinating  Nausea and vomiting  Increased pain at the site of the incision that is unrelieved with pain medicine.  Difficulty breathing with or without chest pain  New calf pain especially if only on one side  Sudden, continuing increased vaginal bleeding with or without clots.   Follow-up: 1. See Everitt Amber in 3 weeks.  Contacts: For questions or concerns you should contact:  Dr. Everitt Amber at [phone] After hours and on week-ends call [phone] and ask to speak to the physician on call for Gynecologic Oncology

## 2017-07-21 ENCOUNTER — Telehealth: Payer: Self-pay | Admitting: Neurology

## 2017-07-21 NOTE — Telephone Encounter (Signed)
I called pt. I advised her of Dr. Guadelupe Sabin recommendations. She is agreeable to discussing an oral appliance with her dentist and then letting me know if she needs a referral to another dentist.

## 2017-07-21 NOTE — Telephone Encounter (Signed)
Please update pt that because of the very mild nature of the sleep apnea, her insurance will not cover her AutoPap machine. Her treatment options at this point are try to lose weight and avoid sleeping on her back or we could send a referral to dentistry for consideration of a dental device/oral appliance. She can also try to find out from her own dentist if they provide treatment for sleep apnea with a dental device. If needed we can certainly put a referral to a dentist practice that we know works with sleep apnea patients.

## 2017-07-26 ENCOUNTER — Encounter: Payer: Self-pay | Admitting: Gynecologic Oncology

## 2017-07-26 ENCOUNTER — Telehealth: Payer: Self-pay

## 2017-07-26 NOTE — Telephone Encounter (Signed)
Told Ms Lomba that Lenna Sciara wrote a letter for return to work for 08-08-17 as requested. Pt stated that she could see the letter on MY Chart and will be able to print it from there. Pt inquired about the status of her pathology report from her surgery. Told her that Dr. Denman George needs to review the report.  She will be notified when the review is completed. Pt verbalized understanding.

## 2017-07-27 ENCOUNTER — Telehealth: Payer: Self-pay | Admitting: Gynecologic Oncology

## 2017-07-27 NOTE — Telephone Encounter (Signed)
Left message to contact us regarding pathology results.  She has a stage I tumor with low risk features, other than isolated tumor cells in one lymph node. The recommendation will be for close observation, as isolated tumor cells in lymph nodes have not been associated with worse prognosis.

## 2017-07-28 ENCOUNTER — Encounter: Payer: Self-pay | Admitting: Gynecologic Oncology

## 2017-07-28 NOTE — Progress Notes (Signed)
[facility] ordered on path specimen from hysterectomy on 07/19/2017 with Rhonda at [facility]. 

## 2017-07-31 ENCOUNTER — Other Ambulatory Visit: Payer: Self-pay | Admitting: Internal Medicine

## 2017-08-01 ENCOUNTER — Other Ambulatory Visit: Payer: Self-pay

## 2017-08-01 ENCOUNTER — Encounter: Payer: Self-pay | Admitting: Internal Medicine

## 2017-08-01 MED ORDER — METFORMIN HCL ER 500 MG PO TB24: 500.0000 mg | ORAL_TABLET | Freq: Every day | ORAL | 1 refills | Status: DC

## 2017-08-04 ENCOUNTER — Telehealth: Payer: Self-pay | Admitting: *Deleted

## 2017-08-04 ENCOUNTER — Encounter: Payer: Self-pay | Admitting: Gynecologic Oncology

## 2017-08-04 NOTE — Telephone Encounter (Signed)
Faxed office note to [facility]

## 2017-08-04 NOTE — Telephone Encounter (Signed)
Called the patient and told her the new work letter was in my chart.

## 2017-08-08 ENCOUNTER — Encounter: Payer: Self-pay | Admitting: Gynecologic Oncology

## 2017-08-08 ENCOUNTER — Telehealth: Payer: Self-pay | Admitting: *Deleted

## 2017-08-08 ENCOUNTER — Telehealth: Payer: Self-pay | Admitting: Gynecologic Oncology

## 2017-08-08 DIAGNOSIS — C541 Malignant neoplasm of endometrium: Secondary | ICD-10-CM

## 2017-08-08 NOTE — Telephone Encounter (Signed)
Left message with patient about recommendation for a PET scan as a baseline.  Advised she would be contacted with information for her scan and advised to call for any needs or concerns.

## 2017-08-08 NOTE — Telephone Encounter (Signed)
Called and gave the patient the appt for PET scan with the instructions on April 30th at 7am arrive at 6:30am, hold AM metformin and NPO after midnight

## 2017-08-08 NOTE — Progress Notes (Signed)
Gynecologic Oncology Multi-Disciplinary Disposition Conference Note  Date of the Conference: August 08, 2017  Patient Name: Margaret [name]  Referring Provider: Dr. Stann Mainland Primary GYN Oncologist: Dr. Everitt Amber  Stage/Disposition:  Stage IA endometrioid adenocarcinoma with isolated tumor cells.  Disposition is to PET or CT imaging if PET not approved by insurance and close observation.   This Multidisciplinary conference took place involving physicians from [place], [place], Radiation Oncology, Pathology, Radiology along with the Gynecologic Oncology Nurse Practitioner and RN.  Comprehensive assessment of the patient's malignancy, staging, need for surgery, chemotherapy, radiation therapy, and need for further testing were reviewed. Supportive measures, both inpatient and following discharge were also discussed. The recommended plan of care is documented. Greater than 35 minutes were spent correlating and coordinating this patient's care.

## 2017-08-10 NOTE — Progress Notes (Signed)
[name] [address], [place] [address] Phone: [phone] Subjective:     CC: left shoulder   WEX:[id number]  Margaret [name] is a 62 y.o. female coming in with complaint of left shoulder pain been going on 2 months. States the pain came from her sleeping on it. At it's worse at night. Also has some neck pain. 800 mg motron. Numbness and tingling. Sharp, achy. Has been icing and using pennsaid.  Patient has been treated recently uterine cancer and did have a total hysterectomy.  Has recovered from that and is trying to increase activity but finds it difficult.  Maybe even some mild loss of range of motion.      Past Medical History:  Diagnosis Date  . Arthritis   . Endometrial cancer ([place])   . Exercise-induced asthma   . Heart murmur   . History of bradycardia 2011   had an episode of HR 38 at time of blood work w/ palpitations and SOB,  work-up done by cardiology (dr dalton Aundra Dubin) stress echo normal on 01/ 2012 and (12/ 2011) holter monitor with PACs with average HR 68/  07-14-2017 per pt no issues since then  . Hx of colonic polyps    x3  . IBS (irritable bowel syndrome)   . OSA (obstructive sleep apnea) 07-14-2017 per had a new study on 07-12-2017 have not heard about results yet   per study 01-06-2005 moderate OSA (AHI 23.3/hr) used cpap, per pt lost weight withthe resolution of symptoms but has gain weight back  . Type 2 diabetes mellitus ([place])   . Wears glasses    Past Surgical History:  Procedure Laterality Date  . colonoscopy with polypectomy  2012 approx.  . DOBUTAMINE STRESS ECHO  04/2010   normal with no evidence of ischemia or infarction  . LAPAROSCOPIC CHOLECYSTECTOMY  1996  . PLANTAR FASCIA RELEASE    . ROBOTIC ASSISTED SUPRACERVICAL HYSTERECTOMY WITH BILATERAL SALPINGO OOPHERECTOMY N/A 07/19/2017   Procedure: XI ROBOTIC ASSISTED  HYSTERECTOMY WITH BILATERAL SALPINGO OOPHORECTOMY; SENTINEL LYMPH NODE BIOPSY;  Surgeon:  Everitt Amber, MD;  Location: [facility];  Service: Gynecology;  Laterality: N/A;  . SENTINEL NODE BIOPSY N/A 07/19/2017   Procedure: SENTINEL NODE BIOPSY;  Surgeon: Everitt Amber, MD;  Location: [facility];  Service: Gynecology;  Laterality: N/A;  . TRANSTHORACIC ECHOCARDIOGRAM  04/17/2010   ef 55%/  trivial AR and TR/ left LAE/     Social History   Socioeconomic History  . Marital status: Married    Spouse name: Not on file  . Number of children: Not on file  . Years of education: Not on file  . Highest education level: Not on file  Occupational History  . Occupation: Engineer, manufacturing: [employer]  . Financial resource strain: Not on file  . Food insecurity:    Worry: Not on file    Inability: Not on file  . Transportation needs:    Medical: Not on file    Non-medical: Not on file  Tobacco Use  . Smoking status: Former Smoker    Types: Cigarettes  . Smokeless tobacco: Never Used  . Tobacco comment: 07-14-2017 per pt Smoked [age] ONLY as teen  Substance and Sexual Activity  . Alcohol use: Yes    Comment: rarely  . Drug use: No  . Sexual activity: Not on file  Lifestyle  . Physical activity:    Days per  week: Not on file    Minutes per session: Not on file  . Stress: Not on file  Relationships  . Social connections:    Talks on phone: Not on file    Gets together: Not on file    Attends religious service: Not on file    Active member of club or organization: Not on file    Attends meetings of clubs or organizations: Not on file    Relationship status: Not on file  Other Topics Concern  . Not on file  Social History Narrative  . Not on file   Allergies  Allergen Reactions  . Amoxicillin Hives  . Other Anaphylaxis    TREE NUTS  . Penicillins     Hives Has patient had a PCN reaction causing immediate rash, facial/tongue/throat swelling, SOB or lightheadedness with hypotension: No Has patient had a PCN reaction causing severe rash  involving mucus membranes or skin necrosis: No Has patient had a PCN reaction that required hospitalization: No Has patient had a PCN reaction occurring within the last 10 years: No If all of the above answers are "NO", then may proceed with Cephalosporin use.   . Paroxetine Other (See Comments)    Headache , malaise   Family History  Problem Relation Age of Onset  . Colon polyps Father   . COPD Father   . Diabetes Father        diet controlled  . Heart attack Mother 64  . COPD Mother   . Lung cancer Mother   . Peripheral vascular disease Mother   . Hypertension Brother        2 bro  . Stroke Neg Hx      Past medical history, social, surgical and family history all reviewed in electronic medical record.  No pertanent information unless stated regarding to the chief complaint.   Review of Systems:Review of systems updated and as accurate as of 08/11/17  No headache, visual changes, nausea, vomiting, diarrhea, constipation, dizziness, abdominal pain, skin rash, fevers, chills, night sweats, weight loss, swollen lymph nodes, body aches, joint swelling, muscle aches, chest pain, shortness of breath, mood changes.   Objective  Blood pressure 112/82, pulse 90, height 5\' 4"  (1.626 m), weight 235 lb (106.6 kg), SpO2 97 %. Systems examined below as of 08/11/17   General: No apparent distress alert and oriented x3 mood and affect normal, dressed appropriately.  HEENT: Pupils equal, extraocular movements intact  Respiratory: Patient's speak in full sentences and does not appear short of breath  Cardiovascular: No lower extremity edema, non tender, no erythema  Skin: Warm dry intact with no signs of infection or rash on extremities or on axial skeleton.  Abdomen: Soft nontender  Neuro: Cranial nerves II through XII are intact, neurovascularly intact in all extremities with 2+ DTRs and 2+ pulses.  Lymph: No lymphadenopathy of posterior or anterior cervical chain or axillae bilaterally.    Gait normal with good balance and coordination.  MSK:  Non tender with full range of motion and good stability and symmetric strength and tone of elbows, wrist, hip, knee and ankles bilaterally.  Shoulder: left Inspection reveals no abnormalities, atrophy or asymmetry. Palpation is normal with no tenderness over AC joint or bicipital groove. ROM is full in all planes passively. Rotator cuff strength normal throughout. signs of impingement with positive Neer and Hawkin's tests, but negative empty can sign. Speeds and Yergason's tests normal. No labral pathology noted with negative Obrien's, negative clunk and good stability. Normal  scapular function observed. No painful arc and no drop arm sign. No apprehension sign  MSK US performed of: left This study was ordered, performed, and interpreted by Charlann Boxer D.O.  Shoulder:   Supraspinatus:  Appears normal on long and transverse views, Bursal bulge seen with shoulder abduction on impingement view. Infraspinatus:  Appears normal on long and transverse views. Significant increase in Doppler flow Subscapularis:  Appears normal on long and transverse views. Positive bursa Teres Minor:  Appears normal on long and transverse views. AC joint:  Capsule undistended, no geyser sign. Glenohumeral Joint:  Appears normal without effusion. Glenoid Labrum:  Intact without visualized tears. Biceps Tendon:  Appears normal on long and transverse views, no fraying of tendon, tendon located in intertubercular groove, no subluxation with shoulder internal or external rotation.  Impression: Subacromial bursitis  Procedure: Real-time Ultrasound Guided Injection of left glenohumeral joint Device: GE Logiq E  Ultrasound guided injection is preferred based studies that show increased duration, increased effect, greater accuracy, decreased procedural pain, increased response rate with ultrasound guided versus blind injection.  Verbal informed consent obtained.   Time-out conducted.  Noted no overlying erythema, induration, or other signs of local infection.  Skin prepped in a sterile fashion.  Local anesthesia: Topical Ethyl chloride.  With sterile technique and under real time ultrasound guidance:  Joint visualized.  23g 1  inch needle inserted posterior approach. Pictures taken for needle placement. Patient did have injection of 2 cc of 1% lidocaine, 2 cc of 0.5% Marcaine, and 1.0 cc of Kenalog 40 mg/dL. Completed without difficulty  Pain immediately resolved suggesting accurate placement of the medication.  Advised to call if fevers/chills, erythema, induration, drainage, or persistent bleeding.  Images permanently stored and available for review in the ultrasound unit.  Impression: Technically successful ultrasound guided injection.    Impression and Recommendations:     This case required medical decision making of moderate complexity.      Note: This dictation was prepared with Dragon dictation along with smaller phrase technology. Any transcriptional errors that result from this process are unintentional.

## 2017-08-11 ENCOUNTER — Ambulatory Visit (INDEPENDENT_AMBULATORY_CARE_PROVIDER_SITE_OTHER): Payer: BLUE CROSS/BLUE SHIELD | Admitting: Family Medicine

## 2017-08-11 ENCOUNTER — Ambulatory Visit: Payer: Self-pay

## 2017-08-11 ENCOUNTER — Encounter: Payer: Self-pay | Admitting: Family Medicine

## 2017-08-11 ENCOUNTER — Ambulatory Visit (INDEPENDENT_AMBULATORY_CARE_PROVIDER_SITE_OTHER)
Admission: RE | Admit: 2017-08-11 | Discharge: 2017-08-11 | Disposition: A | Discharge status: Home / Self Care | Payer: BLUE CROSS/BLUE SHIELD | Source: Ambulatory Visit | Attending: Family Medicine | Admitting: Family Medicine

## 2017-08-11 VITALS — BP 112/82 | HR 90 | Ht 64.0 in | Wt 235.0 lb

## 2017-08-11 DIAGNOSIS — M25512 Pain in left shoulder: Secondary | ICD-10-CM

## 2017-08-11 DIAGNOSIS — M7502 Adhesive capsulitis of left shoulder: Secondary | ICD-10-CM | POA: Diagnosis not present

## 2017-08-11 NOTE — Patient Instructions (Signed)
Good to see you  Ice 20 minutes 2 times daily. Usually after activity and before bed. Exercises 3 times a week.  pennsaid pinkie amount topically 2 times daily as needed.  We will get xrays today  Consider a new pillow  See me again in 4 weeks

## 2017-08-11 NOTE — Assessment & Plan Note (Signed)
Concerned the patient could have early findings that are consistent with a frozen shoulder.  Patient given home exercises to work with Product/process development scientist.  Discussed icing regimen and home exercises.  We discussed topical anti-inflammatories.  Importance of vitamin D supplementation.  Due to recent history of cancer we are going to get x-rays.  Patient is already scheduled for a PET scan in the near future.  Follow-up again in 4 to 6 weeks

## 2017-08-15 ENCOUNTER — Encounter: Payer: Self-pay | Admitting: Family Medicine

## 2017-08-16 ENCOUNTER — Encounter (HOSPITAL_COMMUNITY)
Admission: RE | Admit: 2017-08-16 | Discharge: 2017-08-16 | Disposition: A | Discharge status: Home / Self Care | Payer: BLUE CROSS/BLUE SHIELD | Source: Ambulatory Visit | Attending: Gynecologic Oncology | Admitting: Gynecologic Oncology

## 2017-08-16 ENCOUNTER — Telehealth: Payer: Self-pay

## 2017-08-16 ENCOUNTER — Ambulatory Visit: Payer: BLUE CROSS/BLUE SHIELD | Admitting: Family Medicine

## 2017-08-16 DIAGNOSIS — C541 Malignant neoplasm of endometrium: Secondary | ICD-10-CM | POA: Diagnosis not present

## 2017-08-16 LAB — GLUCOSE, CAPILLARY: GLUCOSE-CAPILLARY: 224 mg/dL — AB (ref 65–99)

## 2017-08-16 MED ORDER — FLUDEOXYGLUCOSE F - 18 (FDG) INJECTION
11.6200 | Freq: Once | INTRAVENOUS | Status: AC | PRN
  Administered 2017-08-16: 11.62 via INTRAVENOUS

## 2017-08-16 NOTE — Telephone Encounter (Signed)
LM for Ms Passon to call back to discuss the results of her PET scan done today 08-16-17.

## 2017-08-17 NOTE — Telephone Encounter (Signed)
Told Margaret [name] that the Pet Scan showed no enlarged lymph nodes or signs of spread of the cancer.   Dr. Denman George will review further at post op appointment on 08-19-17.

## 2017-08-17 NOTE — Telephone Encounter (Signed)
LM for pt to call back regarding the results of the Pet Scan.

## 2017-08-19 ENCOUNTER — Encounter: Payer: Self-pay | Admitting: Gynecologic Oncology

## 2017-08-19 ENCOUNTER — Inpatient Hospital Stay: Payer: BLUE CROSS/BLUE SHIELD | Attending: Gynecologic Oncology | Admitting: Gynecologic Oncology

## 2017-08-19 VITALS — BP 129/85 | HR 96 | Temp 98.8°F | Resp 18 | Ht 64.0 in | Wt 230.0 lb

## 2017-08-19 DIAGNOSIS — Z90722 Acquired absence of ovaries, bilateral: Secondary | ICD-10-CM

## 2017-08-19 DIAGNOSIS — F17210 Nicotine dependence, cigarettes, uncomplicated: Secondary | ICD-10-CM | POA: Diagnosis not present

## 2017-08-19 DIAGNOSIS — Z7189 Other specified counseling: Secondary | ICD-10-CM

## 2017-08-19 DIAGNOSIS — Z87891 Personal history of nicotine dependence: Secondary | ICD-10-CM | POA: Diagnosis not present

## 2017-08-19 DIAGNOSIS — Z90710 Acquired absence of both cervix and uterus: Secondary | ICD-10-CM | POA: Insufficient documentation

## 2017-08-19 DIAGNOSIS — C541 Malignant neoplasm of endometrium: Secondary | ICD-10-CM | POA: Insufficient documentation

## 2017-08-19 NOTE — Progress Notes (Signed)
Consult Note: Gyn-Onc  Consult was requested by Dr. Stann Mainland for the evaluation of Margaret [name] 62 y.o. female  CC:  Chief Complaint  Patient presents with  . Endometrial cancer [facility])    Assessment/Plan:  [name] is a 62 y.o. with stage IA grade 1 endometrioid endometrial adenocarcinoma (MSI stable/normal).   Pathology revealed low risk factors for recurrence, therefore no adjuvant therapy is recommended according to NCCN guidelines.  I had an extensive discussion with the patient regarding the finding of isolated tumor cells in the unclear clinical significance of these.  We discussed that retrospective data which is the best we have to offer present has not demonstrated worse prognosis independently with this finding.  Therefore adjuvant therapy is not recommended strictly based on the finding of isolated tumor cells.  I discussed that chemotherapy which is the adjuvant therapy offered for lymph node metastases is a toxic treatment with its own significant risk factors. She is in agreement for close/expectant follow-up.  I discussed risk for recurrence and typical symptoms encouraged her to notify us of these should they develop between visits.  I recommend she have follow-up every 6 months for 5 years in accordance with NCCN guidelines. Those visits should include symptom assessment, physical exam and pelvic examination. Pap smears are not indicated or recommended in the routine surveillance of endometrial cancer.   HPI: [name] is a 62 y.o. Gravida 1 para 1 last menstrual period at approximately 62 years of age.  She noted an episode of vaginal bleeding approximately 3 weeks ago pelvic ultrasound on May 24, 2017 notable for an endometrium that was thickened and hypervascular uterus measured 9.3 cm in greatest length of the left ovary was within normal limits comment was made regarding the right ovary.  A 1.6 cm fibroid was appreciated.  I do endometrial  biopsy was collected and notable for a fragmented specimen with at least endometrial intraepithelial neoplasia and features highly suspicious of endometrioid carcinoma.  She has been on Micronase progesterone since the vaginal bleeding and takes 200 mg daily.  Mrs. Speegle states that the vaginal bleeding has markedly decreased she reports 15 pound weight loss in the last month. Her family history is unremarkable for associated malignancy.  Interval hx: On July 19, 2017 [name] underwent a robotic assisted total hysterectomy BSO and sentinel lymph node biopsy.  Intraoperative findings were unremarkable and no extrauterine disease was apparent.  Final pathology revealed a 2.5 cm grade 1 endometrioid adenocarcinoma which invaded the myometrium 0.6 cm of 1.2 cm thickness.  There is no cervical stromal involvement no other involvement of the fallopian tubes and ovaries.  Lymphovascular space invasion was not identified.  Evaluation of the sentinel lymph nodes with immunohistochemistry revealed isolated tumor cells involving a single lymph node in the right obturated space.  Immunostains revealed that this was not MSI stable/normal tumor.  The patient's case was presented at tumor board.  Given the otherwise low risk features and her primary uterine tumor, and the uncertain clinical significance of isolated tumor cells, the recommendation was for close observation and no adjuvant therapy at this time in accordance with NCCN guidelines.  Since surgery she is doing well with no complaints.   Review of Systems:  Constitutional  Feels well, Cardiovascular  No chest pain, shortness of breath, or edema  Pulmonary  No cough or wheeze.  Gastro Intestinal  No nausea, vomitting, or diarrhoea. No bright red blood per rectum, no abdominal pain, change in  bowel movement, or constipation.  Genito Urinary  No frequency, urgency, dysuria, no vaginal spotting  Musculo Skeletal  No myalgia, arthralgia, joint  swelling or pain  Neurologic  No weakness, numbness, change in gait,  Psychology  No depression, anxiety, insomnia.    Current Meds:  Outpatient Encounter Medications as of 08/19/2017  Medication Sig  . albuterol (PROVENTIL HFA;VENTOLIN HFA) 108 (90 Base) MCG/ACT inhaler Inhale 2 puffs into the lungs every 6 (six) hours as needed for wheezing or shortness of breath.  . Calcium-Mag-Vit C-Vit D 185-28-2.5-200 CAPS   . Diclofenac Sodium (PENNSAID) 2 % SOLN Place 2 application 2 (two) times daily onto the skin.  Marland Kitchen lisinopril (PRINIVIL,ZESTRIL) 2.5 MG tablet TAKE 1 TABLET (2.5 MG TOTAL) DAILY BY MOUTH.  . metFORMIN (GLUCOPHAGE XR) 500 MG 24 hr tablet Take 1 tablet (500 mg total) by mouth daily with breakfast.  . naproxen sodium (ALEVE) 220 MG tablet Take 440 mg by mouth as needed (PAIN).  Marland Kitchen OVER THE COUNTER MEDICATION Tumeric 500 mg bid .  Marland Kitchen OVER THE COUNTER MEDICATION Cinnamon 500 mg daily.  . vitamin B-12 (CYANOCOBALAMIN) 500 MCG tablet Take 500 mcg by mouth daily.  . [DISCONTINUED] Vitamin D, Ergocalciferol, (DRISDOL) 50000 units CAPS capsule TAKE 1 CAPSULE (50,000 UNITS TOTAL) EVERY 7 (SEVEN) DAYS BY MOUTH.   No facility-administered encounter medications on file as of 08/19/2017.     Allergy:  Allergies  Allergen Reactions  . Amoxicillin Hives  . Other Anaphylaxis    TREE NUTS  . Penicillins     Hives Has patient had a PCN reaction causing immediate rash, facial/tongue/throat swelling, SOB or lightheadedness with hypotension: No Has patient had a PCN reaction causing severe rash involving mucus membranes or skin necrosis: No Has patient had a PCN reaction that required hospitalization: No Has patient had a PCN reaction occurring within the last 10 years: No If all of the above answers are "NO", then may proceed with Cephalosporin use.   . Paroxetine Other (See Comments)    Headache , malaise    Social Hx:   Social History   Socioeconomic History  . Marital status: Married     Spouse name: Not on file  . Number of children: Not on file  . Years of education: Not on file  . Highest education level: Not on file  Occupational History  . Occupation: Engineer, manufacturing: [employer]  . Financial resource strain: Not on file  . Food insecurity:    Worry: Not on file    Inability: Not on file  . Transportation needs:    Medical: Not on file    Non-medical: Not on file  Tobacco Use  . Smoking status: Former Smoker    Types: Cigarettes  . Smokeless tobacco: Never Used  . Tobacco comment: 07-14-2017 per pt Smoked [age] ONLY as teen  Substance and Sexual Activity  . Alcohol use: Yes    Comment: rarely  . Drug use: No  . Sexual activity: Not on file  Lifestyle  . Physical activity:    Days per week: Not on file    Minutes per session: Not on file  . Stress: Not on file  Relationships  . Social connections:    Talks on phone: Not on file    Gets together: Not on file    Attends religious service: Not on file    Active member of club or organization: Not on file  Attends meetings of clubs or organizations: Not on file    Relationship status: Not on file  . Intimate partner violence:    Fear of current or ex partner: Not on file    Emotionally abused: Not on file    Physically abused: Not on file    Forced sexual activity: Not on file  Other Topics Concern  . Not on file  Social History Narrative  . Not on file    Past Surgical Hx:  Past Surgical History:  Procedure Laterality Date  . colonoscopy with polypectomy  2012 approx.  . DOBUTAMINE STRESS ECHO  04/2010   normal with no evidence of ischemia or infarction  . LAPAROSCOPIC CHOLECYSTECTOMY  1996  . PLANTAR FASCIA RELEASE    . ROBOTIC ASSISTED SUPRACERVICAL HYSTERECTOMY WITH BILATERAL SALPINGO OOPHERECTOMY N/A 07/19/2017   Procedure: XI ROBOTIC ASSISTED  HYSTERECTOMY WITH BILATERAL SALPINGO OOPHORECTOMY; SENTINEL LYMPH NODE BIOPSY;  Surgeon: Everitt Amber,  MD;  Location: [facility];  Service: Gynecology;  Laterality: N/A;  . SENTINEL NODE BIOPSY N/A 07/19/2017   Procedure: SENTINEL NODE BIOPSY;  Surgeon: Everitt Amber, MD;  Location: [facility];  Service: Gynecology;  Laterality: N/A;  . TRANSTHORACIC ECHOCARDIOGRAM  04/17/2010   ef 55%/  trivial AR and TR/ left LAE/      Past Medical Hx:  Past Medical History:  Diagnosis Date  . Arthritis   . Endometrial cancer ([place])   . Exercise-induced asthma   . Heart murmur   . History of bradycardia 2011   had an episode of HR 38 at time of blood work w/ palpitations and SOB,  work-up done by cardiology (dr dalton Aundra Dubin) stress echo normal on 01/ 2012 and (12/ 2011) holter monitor with PACs with average HR 68/  07-14-2017 per pt no issues since then  . Hx of colonic polyps    x3  . IBS (irritable bowel syndrome)   . OSA (obstructive sleep apnea) 07-14-2017 per had a new study on 07-12-2017 have not heard about results yet   per study 01-06-2005 moderate OSA (AHI 23.3/hr) used cpap, per pt lost weight withthe resolution of symptoms but has gain weight back  . Type 2 diabetes mellitus ([place])   . Wears glasses   Last colonoscopy 6 years ago with removal of polyps.  Last mammogram October 2018  Past Gynecological History: Menarche age 5 menses every month.  Reports significant dysmenorrhea until her 67s.  Menopause at age 60.   Reports oral contraceptive pill use for over 20 years.  Denies a history of an abnormal Pap test  family Hx:  Family History  Problem Relation Age of Onset  . Colon polyps Father   . COPD Father   . Diabetes Father        diet controlled  . Heart attack Mother 31  . COPD Mother   . Lung cancer Mother   . Peripheral vascular disease Mother   . Hypertension Brother        2 bro  . Stroke Neg Hx     Vitals:  Blood pressure 129/85, pulse 96, temperature 98.8 F (37.1 C), resp. rate 18, height 5' 4" (1.626 m), weight 230 lb (104.3 kg), SpO2 98 %. Body mass index is 39.48  kg/m.   Physical Exam: WD in NAD Neck  Supple NROM, without any enlargements.  Lymph Node Survey No cervical supraclavicular or inguinal adenopathy Cardiovascular  Pulse normal rate, regularity and rhythm. S1 and S2 normal.  Lungs  Clear to auscultation bilaterally,  without wheezes/crackles/rhonchi. Good air movement.  Skin  No rash/lesions/breakdown  Psychiatry  Alert and oriented appropriate mood affect speech and reasoning. Abdomen  Normoactive bowel sounds, abdomen soft, non-tender, without evidence of hernia. Well healed in cisions.  Back No CVA tenderness Genito Urinary  External genitalia normal Vagina: cuff well healed, no separation or lesions. No bleeding. No masses. Surgically absent uterus and cervix. Rectal  deferred Extremities  No bilateral cyanosis, clubbing or edema.   30 minutes of direct face to face counseling time was spent with the patient. This included discussion about prognosis, therapy recommendations and postoperative side effects and are beyond the scope of routine postoperative care.   Thereasa Solo, MD 08/19/2017, 5:21 PM

## 2017-08-19 NOTE — Patient Instructions (Signed)
Please return to see Dr Denman George in 6 months as scheduled.  Please notify Dr Denman George at phone number [phone] if you notice vaginal bleeding, new pelvic or abdominal pains, bloating, feeling full easy, or a change in bladder or bowel function.

## 2017-08-23 ENCOUNTER — Telehealth: Payer: Self-pay | Admitting: *Deleted

## 2017-08-23 NOTE — Telephone Encounter (Signed)
Patient called and left the following message regarding a work [employer]. The patient stated"I need a the letter for work to state the following; to whom it may concern, it is my medical opinion that [employer] may return to work immediatly with the following restrictions. Part time on Wednesdays up to 4 1/2 hours until Oct 4th."  Notified Melissa [employer]

## 2017-08-24 ENCOUNTER — Encounter: Payer: Self-pay | Admitting: Gynecologic Oncology

## 2017-08-25 ENCOUNTER — Encounter: Payer: Self-pay | Admitting: Gynecologic Oncology

## 2017-08-25 ENCOUNTER — Telehealth: Payer: Self-pay

## 2017-08-25 NOTE — Telephone Encounter (Signed)
Attempted to reach patient to discuss parttime working on Wednesdays for 41/2 hrs will be until October 18 2017 = 3 months from surgery.  Not until 01-20-18 as patient requested. Pt 's voice mail is full.

## 2017-09-08 ENCOUNTER — Other Ambulatory Visit: Payer: Self-pay | Admitting: Internal Medicine

## 2017-09-08 DIAGNOSIS — E669 Obesity, unspecified: Principal | ICD-10-CM

## 2017-09-08 DIAGNOSIS — E1169 Type 2 diabetes mellitus with other specified complication: Secondary | ICD-10-CM

## 2017-10-04 NOTE — Progress Notes (Signed)
[name] [place], [place] [address] Phone: [phone] Subjective:     CC: shoulder pain follow-up  [id number]  Margaret [name] is a 62 y.o. female coming in with complaint of left shoulder pain.  Patient was found to have mild to moderate osteoarthritic changes as well as may be a possible rotator cuff tear.  Has been 2 months since we have seen patient.  Was doing much better at that time.  Now worsening symptoms again.  Waking her up at night. Still radiating down the arm a little bit as well.     Past Medical History:  Diagnosis Date  . Arthritis   . Endometrial cancer ([place])   . Exercise-induced asthma   . Heart murmur   . History of bradycardia 2011   had an episode of HR 38 at time of blood work w/ palpitations and SOB,  work-up done by cardiology (dr dalton Aundra Dubin) stress echo normal on 01/ 2012 and (12/ 2011) holter monitor with PACs with average HR 68/  07-14-2017 per pt no issues since then  . Hx of colonic polyps    x3  . IBS (irritable bowel syndrome)   . OSA (obstructive sleep apnea) 07-14-2017 per had a new study on 07-12-2017 have not heard about results yet   per study 01-06-2005 moderate OSA (AHI 23.3/hr) used cpap, per pt lost weight withthe resolution of symptoms but has gain weight back  . Type 2 diabetes mellitus ([place])   . Wears glasses    Past Surgical History:  Procedure Laterality Date  . colonoscopy with polypectomy  2012 approx.  . DOBUTAMINE STRESS ECHO  04/2010   normal with no evidence of ischemia or infarction  . LAPAROSCOPIC CHOLECYSTECTOMY  1996  . PLANTAR FASCIA RELEASE    . ROBOTIC ASSISTED SUPRACERVICAL HYSTERECTOMY WITH BILATERAL SALPINGO OOPHERECTOMY N/A 07/19/2017   Procedure: XI ROBOTIC ASSISTED  HYSTERECTOMY WITH BILATERAL SALPINGO OOPHORECTOMY; SENTINEL LYMPH NODE BIOPSY;  Surgeon: Everitt Amber, MD;  Location: [facility];  Service: Gynecology;  Laterality: N/A;  . SENTINEL NODE BIOPSY N/A  07/19/2017   Procedure: SENTINEL NODE BIOPSY;  Surgeon: Everitt Amber, MD;  Location: [facility];  Service: Gynecology;  Laterality: N/A;  . TRANSTHORACIC ECHOCARDIOGRAM  04/17/2010   ef 55%/  trivial AR and TR/ left LAE/     Social History   Socioeconomic History  . Marital status: Married    Spouse name: Not on file  . Number of children: Not on file  . Years of education: Not on file  . Highest education level: Not on file  Occupational History  . Occupation: Engineer, manufacturing: [employer]  . Financial resource strain: Not on file  . Food insecurity:    Worry: Not on file    Inability: Not on file  . Transportation needs:    Medical: Not on file    Non-medical: Not on file  Tobacco Use  . Smoking status: Former Smoker    Types: Cigarettes  . Smokeless tobacco: Never Used  . Tobacco comment: 07-14-2017 per pt Smoked [age] ONLY as teen  Substance and Sexual Activity  . Alcohol use: Yes    Comment: rarely  . Drug use: No  . Sexual activity: Not on file  Lifestyle  . Physical activity:    Days per week: Not on file    Minutes per session: Not on file  . Stress: Not on  file  Relationships  . Social connections:    Talks on phone: Not on file    Gets together: Not on file    Attends religious service: Not on file    Active member of club or organization: Not on file    Attends meetings of clubs or organizations: Not on file    Relationship status: Not on file  Other Topics Concern  . Not on file  Social History Narrative  . Not on file   Allergies  Allergen Reactions  . Amoxicillin Hives  . Other Anaphylaxis    TREE NUTS  . Penicillins     Hives Has patient had a PCN reaction causing immediate rash, facial/tongue/throat swelling, SOB or lightheadedness with hypotension: No Has patient had a PCN reaction causing severe rash involving mucus membranes or skin necrosis: No Has patient had a PCN reaction that required hospitalization:  No Has patient had a PCN reaction occurring within the last 10 years: No If all of the above answers are "NO", then may proceed with Cephalosporin use.   . Paroxetine Other (See Comments)    Headache , malaise   Family History  Problem Relation Age of Onset  . Colon polyps Father   . COPD Father   . Diabetes Father        diet controlled  . Heart attack Mother 14  . COPD Mother   . Lung cancer Mother   . Peripheral vascular disease Mother   . Hypertension Brother        2 bro  . Stroke Neg Hx      Past medical history, social, surgical and family history all reviewed in electronic medical record.  No pertanent information unless stated regarding to the chief complaint.   Review of Systems:Review of systems updated and as accurate as of 10/05/17  No headache, visual changes, nausea, vomiting, diarrhea, constipation, dizziness, abdominal pain, skin rash, fevers, chills, night sweats, weight loss, swollen lymph nodes, body aches, joint swelling, muscle aches, chest pain, shortness of breath, mood changes.   Objective  Blood pressure 130/82, pulse (!) 111, height 5\' 4"  (1.626 m), weight 235 lb (106.6 kg), SpO2 97 %. Systems examined below as of 10/05/17   General: No apparent distress alert and oriented x3 mood and affect normal, dressed appropriately.  HEENT: Pupils equal, extraocular movements intact  Respiratory: Patient's speak in full sentences and does not appear short of breath  Cardiovascular: No lower extremity edema, non tender, no erythema  Skin: Warm dry intact with no signs of infection or rash on extremities or on axial skeleton.  Abdomen: Soft nontender  Neuro: Cranial nerves II through XII are intact, neurovascularly intact in all extremities with 2+ DTRs and 2+ pulses.  Lymph: No lymphadenopathy of posterior or anterior cervical chain or axillae bilaterally.  Gait normal with good balance and coordination.  MSK:  Non tender with full range of motion and good  stability and symmetric strength and tone of elbows, wrist, hip, knee and ankles bilaterally.  Shoulder: Left Inspection reveals no abnormalities, atrophy or asymmetry. Palpation is normal with no tenderness over AC joint or bicipital groove. ROM is full in all planes. Rotator cuff strength normal throughout. Positive impingement Speeds and Yergason's tests normal. No labral pathology noted with negative Obrien's, negative clunk and good stability.  Positive impingement positive crossover Normal scapular function observed. No painful arc and no drop arm sign. No apprehension sign Contralateral shoulder unremarkable  Procedure: Real-time Ultrasound Guided Injection of the left  acromial arthritis Device: GE Logiq Q7 Ultrasound guided injection is preferred based studies that show increased duration, increased effect, greater accuracy, decreased procedural pain, increased response rate, and decreased cost with ultrasound guided versus blind injection.  Verbal informed consent obtained.  Time-out conducted.  Noted no overlying erythema, induration, or other signs of local infection.  Skin prepped in a sterile fashion.  Local anesthesia: Topical Ethyl chloride.  With sterile technique and under real time ultrasound guidance: With a 25-gauge half inch needle patient was injected with 0.5 cc of 0.5% Marcaine and 0.5 cc of Kenalog 40 mg/mL Completed without difficulty  Pain immediately resolved suggesting accurate placement of the medication.  Advised to call if fevers/chills, erythema, induration, drainage, or persistent bleeding.  Images permanently stored and available for review in the ultrasound unit.  Impression: Technically successful ultrasound guided injection.     Impression and Recommendations:     This case required medical decision making of moderate complexity.      Note: This dictation was prepared with Dragon dictation along with smaller phrase technology. Any  transcriptional errors that result from this process are unintentional.

## 2017-10-05 ENCOUNTER — Ambulatory Visit: Payer: Self-pay

## 2017-10-05 ENCOUNTER — Ambulatory Visit (INDEPENDENT_AMBULATORY_CARE_PROVIDER_SITE_OTHER): Payer: BLUE CROSS/BLUE SHIELD | Admitting: Family Medicine

## 2017-10-05 ENCOUNTER — Encounter: Payer: Self-pay | Admitting: Family Medicine

## 2017-10-05 VITALS — BP 130/82 | HR 111 | Ht 64.0 in | Wt 235.0 lb

## 2017-10-05 DIAGNOSIS — G8929 Other chronic pain: Secondary | ICD-10-CM

## 2017-10-05 DIAGNOSIS — M25512 Pain in left shoulder: Secondary | ICD-10-CM

## 2017-10-05 DIAGNOSIS — M19012 Primary osteoarthritis, left shoulder: Secondary | ICD-10-CM

## 2017-10-05 NOTE — Assessment & Plan Note (Signed)
Patient given injection and tolerated the procedure well.  Discussed icing regimen and home exercises.  Discussed which activities of doing which wants to avoid.  Follow-up with me again 4 weeks.

## 2017-10-05 NOTE — Patient Instructions (Signed)
Good to see you  Ice is your friend Keep your hands within your peripheral vision  I am hoping your body will help [place] heal here soon.  See me again in 4 weeks

## 2017-10-18 NOTE — Telephone Encounter (Signed)
I called pt. She still has an appt scheduled for 10/27/17. She asked me to cancel this appt, she no longer needs it, has gone the oral appliance route. Appt cancelled.

## 2017-10-26 ENCOUNTER — Telehealth: Payer: Self-pay

## 2017-10-26 NOTE — Telephone Encounter (Signed)
Told Ms Cisse that she was given the decreased work hours for her surgery  July 19, 2017 untill 10-18-17.   This cannot be extended  Further as she has not had any complications that would affect current leave for the gyn surgery.  Extending the leave would be fraudulent. Pt verbalized understanding.   If she is experience any health issues other then the surgery needs to discuss this with her PCP.

## 2017-10-27 ENCOUNTER — Ambulatory Visit: Payer: Self-pay | Admitting: Neurology

## 2018-01-26 DIAGNOSIS — Z23 Encounter for immunization: Secondary | ICD-10-CM | POA: Diagnosis not present

## 2018-01-31 DIAGNOSIS — Z1231 Encounter for screening mammogram for malignant neoplasm of breast: Secondary | ICD-10-CM | POA: Diagnosis not present

## 2018-01-31 DIAGNOSIS — Z6839 Body mass index (BMI) 39.0-39.9, adult: Secondary | ICD-10-CM | POA: Diagnosis not present

## 2018-01-31 DIAGNOSIS — Z01419 Encounter for gynecological examination (general) (routine) without abnormal findings: Secondary | ICD-10-CM | POA: Diagnosis not present

## 2018-02-20 ENCOUNTER — Encounter: Payer: Self-pay | Admitting: Gynecologic Oncology

## 2018-02-20 ENCOUNTER — Inpatient Hospital Stay: Payer: BLUE CROSS/BLUE SHIELD | Attending: Gynecologic Oncology | Admitting: Gynecologic Oncology

## 2018-02-20 VITALS — BP 109/67 | HR 76 | Temp 98.4°F | Resp 16 | Wt 231.8 lb

## 2018-02-20 DIAGNOSIS — Z87891 Personal history of nicotine dependence: Secondary | ICD-10-CM | POA: Diagnosis not present

## 2018-02-20 DIAGNOSIS — Z90710 Acquired absence of both cervix and uterus: Secondary | ICD-10-CM

## 2018-02-20 DIAGNOSIS — C541 Malignant neoplasm of endometrium: Secondary | ICD-10-CM

## 2018-02-20 DIAGNOSIS — Z90722 Acquired absence of ovaries, bilateral: Secondary | ICD-10-CM | POA: Insufficient documentation

## 2018-02-20 NOTE — Progress Notes (Signed)
Follow-up Note: Gyn-Onc  Consult was requested by Dr. Stann Mainland for the evaluation of Margaret [name] 62 y.o. female  CC:  Chief Complaint  Patient presents with  . Endometrial cancer [facility])    Assessment/Plan:  [name] is a 62 y.o. with stage IA grade [name] endometrioid endometrial adenocarcinoma (MSI stable/normal).   Pathology revealed low risk factors for recurrence, despite ITC's seen in a SLN, therefore no adjuvant therapy is recommended according to NCCN guidelines.  She remains NED on exam.  I discussed risk for recurrence and typical symptoms encouraged her to notify us of these should they develop between visits.  I recommend she continue to have follow-up every 6 months for 5 years in accordance with NCCN guidelines. Those visits should include symptom assessment, physical exam and pelvic examination. Pap smears are not indicated or recommended in the routine surveillance of endometrial cancer.  She will see me again in April, and see Dr Stann Mainland annually in October.    HPI: [name] last menstrual period at approximately 62 years of age.  She noted an episode of vaginal bleeding approximately 3 weeks ago pelvic ultrasound on May 24, 2017 notable for an endometrium that was thickened and hypervascular uterus measured 9.3 cm in greatest length of the left ovary was within normal limits comment was made regarding the right ovary.  A [name].6 cm fibroid was appreciated.  I do endometrial biopsy was collected and notable for a fragmented specimen with at least endometrial intraepithelial neoplasia and features highly suspicious of endometrioid carcinoma.  She has been on Micronase progesterone since the vaginal bleeding and takes 200 mg daily.  Margaret [name] that the vaginal bleeding has markedly decreased she reports 15 pound weight loss in the last month. Her family history is unremarkable for associated malignancy.  Interval  hx: On July 19, 2017 Margaret [name] underwent a robotic assisted total hysterectomy BSO and sentinel lymph node biopsy.  Intraoperative findings were unremarkable and no extrauterine disease was apparent.  Final pathology revealed a 2.5 cm grade [name] endometrioid adenocarcinoma which invaded the myometrium 0.6 cm of [name].2 cm thickness.  There is no cervical stromal involvement no other involvement of the fallopian tubes and ovaries.  Lymphovascular space invasion was not identified.  Evaluation of the sentinel lymph nodes with immunohistochemistry revealed isolated tumor cells involving a single lymph node in the right obturated space.  Immunostains revealed that this was not MSI stable/normal tumor.  The patient's case was presented at tumor board.  Given the otherwise low risk features and her primary uterine tumor, and the uncertain clinical significance of isolated tumor cells, the recommendation was for close observation and no adjuvant therapy at this time in accordance with NCCN guidelines.  Since surgery she is doing well with no complaints.   Review of Systems:  Constitutional  Feels well, Cardiovascular  No chest pain, shortness of breath, or edema  Pulmonary  No cough or wheeze.  Gastro Intestinal  No nausea, vomitting, or diarrhoea. No bright red blood per rectum, no abdominal pain, change in bowel movement, or constipation.  Genito Urinary  No frequency, urgency, dysuria, no vaginal spotting  Musculo Skeletal  No myalgia, arthralgia, joint swelling or pain  Neurologic  No weakness, numbness, change in gait,  Psychology  No depression, anxiety, insomnia.    Current Meds:  Outpatient Encounter Medications as of 02/20/2018  Medication Sig  . albuterol (PROVENTIL HFA;VENTOLIN HFA) 108 (90  Base) MCG/ACT inhaler Inhale 2 puffs into the lungs every 6 (six) hours as needed for wheezing or shortness of breath.  . Calcium-Mag-Vit C-Vit D 185-28-2.5-200 CAPS   . Diclofenac Sodium (PENNSAID) 2 %  SOLN Place 2 application 2 (two) times daily onto the skin.  Marland Kitchen lisinopril (PRINIVIL,ZESTRIL) 2.5 MG tablet TAKE [name] TABLET (2.5 MG TOTAL) DAILY BY MOUTH.  . metFORMIN (GLUCOPHAGE XR) 500 MG 24 hr tablet Take [name] tablet (500 mg total) by mouth daily with breakfast.  . naproxen sodium (ALEVE) 220 MG tablet Take 440 mg by mouth as needed (PAIN).  Marland Kitchen OVER THE COUNTER MEDICATION Tumeric 500 mg bid .  Marland Kitchen OVER THE COUNTER MEDICATION Cinnamon 500 mg daily.  . vitamin B-12 (CYANOCOBALAMIN) 500 MCG tablet Take 500 mcg by mouth daily.   No facility-administered encounter medications on file as of 02/20/2018.     Allergy:  Allergies  Allergen Reactions  . Amoxicillin Hives  . Other Anaphylaxis    TREE NUTS  . Penicillins     Hives Has patient had a PCN reaction causing immediate rash, facial/tongue/throat swelling, SOB or lightheadedness with hypotension: No Has patient had a PCN reaction causing severe rash involving mucus membranes or skin necrosis: No Has patient had a PCN reaction that required hospitalization: No Has patient had a PCN reaction occurring within the last 10 years: No If all of the above answers are "NO", then may proceed with Cephalosporin use.   . Paroxetine Other (See Comments)    Headache , malaise    Social Hx:   Social History   Socioeconomic History  . Marital status: Married    Spouse name: Not on file  . Number of children: Not on file  . Years of education: Not on file  . Highest education level: Not on file  Occupational History  . Occupation: Engineer, manufacturing: [employer]  . Financial resource strain: Not on file  . Food insecurity:    Worry: Not on file    Inability: Not on file  . Transportation needs:    Medical: Not on file    Non-medical: Not on file  Tobacco Use  . Smoking status: Former Smoker    Types: Cigarettes  . Smokeless tobacco: Never Used  . Tobacco comment: 07-14-2017 per pt Smoked [age] ONLY as  teen  Substance and Sexual Activity  . Alcohol use: Yes    Comment: rarely  . Drug use: No  . Sexual activity: Not on file  Lifestyle  . Physical activity:    Days per week: Not on file    Minutes per session: Not on file  . Stress: Not on file  Relationships  . Social connections:    Talks on phone: Not on file    Gets together: Not on file    Attends religious service: Not on file    Active member of club or organization: Not on file    Attends meetings of clubs or organizations: Not on file    Relationship status: Not on file  . Intimate partner violence:    Fear of current or ex partner: Not on file    Emotionally abused: Not on file    Physically abused: Not on file    Forced sexual activity: Not on file  Other Topics Concern  . Not on file  Social History Narrative  . Not on file    Past Surgical Hx:  Past Surgical History:  Procedure Laterality Date  . colonoscopy with polypectomy  2012 approx.  . DOBUTAMINE STRESS ECHO  04/2010   normal with no evidence of ischemia or infarction  . LAPAROSCOPIC CHOLECYSTECTOMY  1996  . PLANTAR FASCIA RELEASE    . ROBOTIC ASSISTED SUPRACERVICAL HYSTERECTOMY WITH BILATERAL SALPINGO OOPHERECTOMY N/A 07/19/2017   Procedure: XI ROBOTIC ASSISTED  HYSTERECTOMY WITH BILATERAL SALPINGO OOPHORECTOMY; SENTINEL LYMPH NODE BIOPSY;  Surgeon: Everitt Amber, MD;  Location: [facility];  Service: Gynecology;  Laterality: N/A;  . SENTINEL NODE BIOPSY N/A 07/19/2017   Procedure: SENTINEL NODE BIOPSY;  Surgeon: Everitt Amber, MD;  Location: [facility];  Service: Gynecology;  Laterality: N/A;  . TRANSTHORACIC ECHOCARDIOGRAM  04/17/2010   ef 55%/  trivial AR and TR/ left LAE/      Past Medical Hx:  Past Medical History:  Diagnosis Date  . Arthritis   . Endometrial cancer ([place])   . Exercise-induced asthma   . Heart murmur   . History of bradycardia 2011   had an episode of HR 38 at time of blood work w/ palpitations and SOB,  work-up done by cardiology (dr  dalton Aundra Dubin) stress echo normal on 01/ 2012 and (12/ 2011) holter monitor with PACs with average HR 68/  07-14-2017 per pt no issues since then  . Hx of colonic polyps    x3  . IBS (irritable bowel syndrome)   . OSA (obstructive sleep apnea) 07-14-2017 per had a new study on 07-12-2017 have not heard about results yet   per study 01-06-2005 moderate OSA (AHI 23.3/hr) used cpap, per pt lost weight withthe resolution of symptoms but has gain weight back  . Type 2 diabetes mellitus ([place])   . Wears glasses   Last colonoscopy 6 years ago with removal of polyps.  Last mammogram October 2018  Past Gynecological History: Menarche age 65 menses every month.  Reports significant dysmenorrhea until her 28s.  Menopause at age 10.   Reports oral contraceptive pill use for over 20 years.  Denies a history of an abnormal Pap test  family Hx:  Family History  Problem Relation Age of Onset  . Colon polyps Father   . COPD Father   . Diabetes Father        diet controlled  . Heart attack Mother 72  . COPD Mother   . Lung cancer Mother   . Peripheral vascular disease Mother   . Hypertension Brother        2 bro  . Stroke Neg Hx     Vitals:  Blood pressure 109/67, pulse 76, temperature 98.4 F (36.9 C), temperature source Oral, resp. rate 16, weight 231 lb 12.8 oz (105.[name] kg), SpO2 100 %. Body mass index is 39.79 kg/m.   Physical Exam: WD in NAD Neck  Supple NROM, without any enlargements.  Lymph Node Survey No cervical supraclavicular or inguinal adenopathy Cardiovascular  Pulse normal rate, regularity and rhythm. S1 and S2 normal.  Lungs  Clear to auscultation bilaterally, without wheezes/crackles/rhonchi. Good air movement.  Skin  No rash/lesions/breakdown  Psychiatry  Alert and oriented appropriate mood affect speech and reasoning. Abdomen  Normoactive bowel sounds, abdomen soft, non-tender, without evidence of hernia. Well healed in cisions.  Back No CVA tenderness Genito Urinary   External genitalia normal Vagina: cuff well healed, no separation or lesions. No bleeding. No masses. Surgically absent uterus and cervix. Rectal  deferred Extremities  No bilateral cyanosis, clubbing or edema.  Thereasa Solo, MD 02/20/2018, 2:02 PM

## 2018-02-20 NOTE — Patient Instructions (Signed)
Please notify Dr Denman George at phone number [phone] if you notice vaginal bleeding, new pelvic or abdominal pains, bloating, feeling full easy, or a change in bladder or bowel function.   Please follow-up with Dr Denman George in April as scheduled, and Dr Stann Mainland in October.

## 2018-03-09 ENCOUNTER — Other Ambulatory Visit: Payer: Self-pay | Admitting: Internal Medicine

## 2018-03-09 DIAGNOSIS — E1169 Type 2 diabetes mellitus with other specified complication: Secondary | ICD-10-CM

## 2018-03-09 DIAGNOSIS — E669 Obesity, unspecified: Principal | ICD-10-CM

## 2018-03-28 DIAGNOSIS — R1310 Dysphagia, unspecified: Secondary | ICD-10-CM | POA: Diagnosis not present

## 2018-03-31 ENCOUNTER — Other Ambulatory Visit (INDEPENDENT_AMBULATORY_CARE_PROVIDER_SITE_OTHER): Payer: BLUE CROSS/BLUE SHIELD

## 2018-03-31 ENCOUNTER — Encounter: Payer: Self-pay | Admitting: Internal Medicine

## 2018-03-31 ENCOUNTER — Ambulatory Visit: Payer: BLUE CROSS/BLUE SHIELD | Admitting: Internal Medicine

## 2018-03-31 VITALS — BP 120/80 | HR 77 | Temp 99.1°F | Ht 64.0 in | Wt 232.0 lb

## 2018-03-31 DIAGNOSIS — E669 Obesity, unspecified: Secondary | ICD-10-CM

## 2018-03-31 DIAGNOSIS — Z23 Encounter for immunization: Secondary | ICD-10-CM | POA: Diagnosis not present

## 2018-03-31 DIAGNOSIS — Z0000 Encounter for general adult medical examination without abnormal findings: Secondary | ICD-10-CM

## 2018-03-31 DIAGNOSIS — E1169 Type 2 diabetes mellitus with other specified complication: Secondary | ICD-10-CM

## 2018-03-31 LAB — COMPREHENSIVE METABOLIC PANEL
ALK PHOS: 77 U/L (ref 39–117)
ALT: 22 U/L (ref 0–35)
AST: 16 U/L (ref 0–37)
Albumin: 3.9 g/dL (ref 3.5–5.2)
BILIRUBIN TOTAL: 0.3 mg/dL (ref 0.2–1.2)
BUN: 25 mg/dL — ABNORMAL HIGH (ref 6–23)
CO2: 25 mEq/L (ref 19–32)
CREATININE: 0.87 mg/dL (ref 0.40–1.20)
Calcium: 9.2 mg/dL (ref 8.4–10.5)
Chloride: 102 mEq/L (ref 96–112)
GFR: 70.51 mL/min (ref >60.00)
GLUCOSE: 272 mg/dL — AB (ref 70–99)
Potassium: 3.9 mEq/L (ref 3.5–5.1)
Sodium: 138 mEq/L (ref 135–145)
TOTAL PROTEIN: 6.6 g/dL (ref 6.0–8.3)

## 2018-03-31 LAB — CBC
HCT: 44.2 % (ref 36.0–46.0)
HEMOGLOBIN: 14.8 g/dL (ref 12.0–15.0)
MCHC: 33.5 g/dL (ref 30.0–36.0)
MCV: 83.1 fl (ref 78.0–100.0)
PLATELETS: 276 10*3/uL (ref 150.0–400.0)
RBC: 5.31 Mil/uL — ABNORMAL HIGH (ref 3.87–5.11)
RDW: 13.1 % (ref 11.5–15.5)
WBC: 6.7 10*3/uL (ref 4.0–10.5)

## 2018-03-31 LAB — LDL CHOLESTEROL, DIRECT: LDL DIRECT: 121 mg/dL

## 2018-03-31 LAB — MICROALBUMIN / CREATININE URINE RATIO
CREATININE, U: 146 mg/dL
MICROALB UR: 1.4 mg/dL (ref 0.0–1.9)
MICROALB/CREAT RATIO: 0.9 mg/g (ref 0.0–30.0)

## 2018-03-31 LAB — LIPID PANEL
Cholesterol: 188 mg/dL (ref 0–200)
HDL: 61.5 mg/dL (ref >39.00)
NonHDL: 126.81
Total CHOL/HDL Ratio: 3
Triglycerides: 231 mg/dL — ABNORMAL HIGH (ref 0.0–149.0)
VLDL: 46.2 mg/dL — ABNORMAL HIGH (ref 0.0–40.0)

## 2018-03-31 LAB — HEMOGLOBIN A1C: HEMOGLOBIN A1C: 9.1 % — AB (ref 4.6–6.5)

## 2018-03-31 NOTE — Assessment & Plan Note (Signed)
BMI 39 but complicated by diabetes, hyperlipidemia, hypertension, OA. She is not sure she can make an attempt to work on weight today.

## 2018-03-31 NOTE — Assessment & Plan Note (Signed)
Checking HgA1c, foot exam done. Eye exam up to date. Taking ACE-I and metformin. Not on statin. Adjust as needed.

## 2018-03-31 NOTE — Patient Instructions (Signed)
We have given you the first shingles shot today and will see you back in about 2-3 months for the second one.  Health Maintenance, Female Adopting a healthy lifestyle and getting preventive care can go a long way to promote health and wellness. Talk with your health care provider about what schedule of regular examinations is right for you. This is a good chance for you to check in with your provider about disease prevention and staying healthy. In between checkups, there are plenty of things you can do on your own. Experts have done a lot of research about which lifestyle changes and preventive measures are most likely to keep you healthy. Ask your health care provider for more information. Weight and diet Eat a healthy diet  Be sure to include plenty of vegetables, fruits, low-fat dairy products, and lean protein.  Do not eat a lot of foods high in solid fats, added sugars, or salt.  Get regular exercise. This is one of the most important things you can do for your health. ? Most adults should exercise for at least 150 minutes each week. The exercise should increase your heart rate and make you sweat (moderate-intensity exercise). ? Most adults should also do strengthening exercises at least twice a week. This is in addition to the moderate-intensity exercise.  Maintain a healthy weight  Body mass index (BMI) is a measurement that can be used to identify possible weight problems. It estimates body fat based on height and weight. Your health care provider can help determine your BMI and help you achieve or maintain a healthy weight.  For females 94 years of age and older: ? A BMI below 18.5 is considered underweight. ? A BMI of 18.5 to 24.9 is normal. ? A BMI of 25 to 29.9 is considered overweight. ? A BMI of 30 and above is considered obese.  Watch levels of cholesterol and blood lipids  You should start having your blood tested for lipids and cholesterol at 60 years of age, then have  this test every 5 years.  You may need to have your cholesterol levels checked more often if: ? Your lipid or cholesterol levels are high. ? You are older than 60 years of age. ? You are at high risk for heart disease.  Cancer screening Lung Cancer  Lung cancer screening is recommended for adults 25-67 years old who are at high risk for lung cancer because of a history of smoking.  A yearly low-dose CT scan of the lungs is recommended for people who: ? Currently smoke. ? Have quit within the past 15 years. ? Have at least a 30-pack-year history of smoking. A pack year is smoking an average of one pack of cigarettes a day for 1 year.  Yearly screening should continue until it has been 15 years since you quit.  Yearly screening should stop if you develop a health problem that would prevent you from having lung cancer treatment.  Breast Cancer  Practice breast self-awareness. This means understanding how your breasts normally appear and feel.  It also means doing regular breast self-exams. Let your health care provider know about any changes, no matter how small.  If you are in your 20s or 30s, you should have a clinical breast exam (CBE) by a health care provider every 1-3 years as part of a regular health exam.  If you are 38 or older, have a CBE every year. Also consider having a breast X-ray (mammogram) every year.  If you  have a family history of breast cancer, talk to your health care provider about genetic screening.  If you are at high risk for breast cancer, talk to your health care provider about having an MRI and a mammogram every year.  Breast cancer gene (BRCA) assessment is recommended for women who have family members with BRCA-related cancers. BRCA-related cancers include: ? Breast. ? Ovarian. ? Tubal. ? Peritoneal cancers.  Results of the assessment will determine the need for genetic counseling and BRCA1 and BRCA2 testing.  Cervical Cancer Your health care  provider may recommend that you be screened regularly for cancer of the pelvic organs (ovaries, uterus, and vagina). This screening involves a pelvic examination, including checking for microscopic changes to the surface of your cervix (Pap test). You may be encouraged to have this screening done every 3 years, beginning at age 62.  For women ages 58-65, health care providers may recommend pelvic exams and Pap testing every 3 years, or they may recommend the Pap and pelvic exam, combined with testing for human papilloma virus (HPV), every 5 years. Some types of HPV increase your risk of cervical cancer. Testing for HPV may also be done on women of any age with unclear Pap test results.  Other health care providers may not recommend any screening for nonpregnant women who are considered low risk for pelvic cancer and who do not have symptoms. Ask your health care provider if a screening pelvic exam is right for you.  If you have had past treatment for cervical cancer or a condition that could lead to cancer, you need Pap tests and screening for cancer for at least 20 years after your treatment. If Pap tests have been discontinued, your risk factors (such as having a new sexual partner) need to be reassessed to determine if screening should resume. Some women have medical problems that increase the chance of getting cervical cancer. In these cases, your health care provider may recommend more frequent screening and Pap tests.  Colorectal Cancer  This type of cancer can be detected and often prevented.  Routine colorectal cancer screening usually begins at 60 years of age and continues through 60 years of age.  Your health care provider may recommend screening at an earlier age if you have risk factors for colon cancer.  Your health care provider may also recommend using home test kits to check for hidden blood in the stool.  A small camera at the end of a tube can be used to examine your colon  directly (sigmoidoscopy or colonoscopy). This is done to check for the earliest forms of colorectal cancer.  Routine screening usually begins at age 92.  Direct examination of the colon should be repeated every 5-10 years through 60 years of age. However, you may need to be screened more often if early forms of precancerous polyps or small growths are found.  Skin Cancer  Check your skin from head to toe regularly.  Tell your health care provider about any new moles or changes in moles, especially if there is a change in a mole's shape or color.  Also tell your health care provider if you have a mole that is larger than the size of a pencil eraser.  Always use sunscreen. Apply sunscreen liberally and repeatedly throughout the day.  Protect yourself by wearing long sleeves, pants, a wide-brimmed hat, and sunglasses whenever you are outside.  Heart disease, diabetes, and high blood pressure  High blood pressure causes heart disease and increases the  risk of stroke. High blood pressure is more likely to develop in: ? People who have blood pressure in the high end of the normal range (130-139/85-89 mm Hg). ? People who are overweight or obese. ? People who are African American.  If you are 35-9 years of age, have your blood pressure checked every 3-5 years. If you are 33 years of age or older, have your blood pressure checked every year. You should have your blood pressure measured twice-once when you are at a hospital or clinic, and once when you are not at a hospital or clinic. Record the average of the two measurements. To check your blood pressure when you are not at a hospital or clinic, you can use: ? An automated blood pressure machine at a pharmacy. ? A home blood pressure monitor.  If you are between 60 years and 104 years old, ask your health care provider if you should take aspirin to prevent strokes.  Have regular diabetes screenings. This involves taking a blood sample to  check your fasting blood sugar level. ? If you are at a normal weight and have a low risk for diabetes, have this test once every three years after 60 years of age. ? If you are overweight and have a high risk for diabetes, consider being tested at a younger age or more often. Preventing infection Hepatitis B  If you have a higher risk for hepatitis B, you should be screened for this virus. You are considered at high risk for hepatitis B if: ? You were born in a country where hepatitis B is common. Ask your health care provider which countries are considered high risk. ? Your parents were born in a high-risk country, and you have not been immunized against hepatitis B (hepatitis B vaccine). ? You have HIV or AIDS. ? You use needles to inject street drugs. ? You live with someone who has hepatitis B. ? You have had sex with someone who has hepatitis B. ? You get hemodialysis treatment. ? You take certain medicines for conditions, including cancer, organ transplantation, and autoimmune conditions.  Hepatitis C  Blood testing is recommended for: ? Everyone born from [birth year] through 1963. ? Anyone with known risk factors for hepatitis C.  Sexually transmitted infections (STIs)  You should be screened for sexually transmitted infections (STIs) including gonorrhea and chlamydia if: ? You are sexually active and are younger than 60 years of age. ? You are older than 60 years of age and your health care provider tells you that you are at risk for this type of infection. ? Your sexual activity has changed since you were last screened and you are at an increased risk for chlamydia or gonorrhea. Ask your health care provider if you are at risk.  If you do not have HIV, but are at risk, it may be recommended that you take a prescription medicine daily to prevent HIV infection. This is called pre-exposure prophylaxis (PrEP). You are considered at risk if: ? You are sexually active and do not regularly  use condoms or know the HIV status of your partner(s). ? You take drugs by injection. ? You are sexually active with a partner who has HIV.  Talk with your health care provider about whether you are at high risk of being infected with HIV. If you choose to begin PrEP, you should first be tested for HIV. You should then be tested every 3 months for as long as you are taking PrEP. Pregnancy  If you are premenopausal and you may become pregnant, ask your health care provider about preconception counseling.  If you may become pregnant, take 400 to 800 micrograms (mcg) of folic acid every day.  If you want to prevent pregnancy, talk to your health care provider about birth control (contraception). Osteoporosis and menopause  Osteoporosis is a disease in which the bones lose minerals and strength with aging. This can result in serious bone fractures. Your risk for osteoporosis can be identified using a bone density scan.  If you are 36 years of age or older, or if you are at risk for osteoporosis and fractures, ask your health care provider if you should be screened.  Ask your health care provider whether you should take a calcium or vitamin D supplement to lower your risk for osteoporosis.  Menopause may have certain physical symptoms and risks.  Hormone replacement therapy may reduce some of these symptoms and risks. Talk to your health care provider about whether hormone replacement therapy is right for you. Follow these instructions at home:  Schedule regular health, dental, and eye exams.  Stay current with your immunizations.  Do not use any tobacco products including cigarettes, chewing tobacco, or electronic cigarettes.  If you are pregnant, do not drink alcohol.  If you are breastfeeding, limit how much and how often you drink alcohol.  Limit alcohol intake to no more than 1 drink per day for nonpregnant women. One drink equals 12 ounces of beer, 5 ounces of wine, or 1 ounces  of hard liquor.  Do not use street drugs.  Do not share needles.  Ask your health care provider for help if you need support or information about quitting drugs.  Tell your health care provider if you often feel depressed.  Tell your health care provider if you have ever been abused or do not feel safe at home. This information is not intended to replace advice given to you by your health care provider. Make sure you discuss any questions you have with your health care provider. Document Released: 10/19/2010 Document Revised: 09/11/2015 Document Reviewed: 01/07/2015 Elsevier Interactive Patient Education  [employer].

## 2018-03-31 NOTE — Assessment & Plan Note (Signed)
Flu shot up to date. Pneumonia up to date. Shingrix given 1st today. Tetanus up to date. Colonoscopy getting soon. Mammogram up to date, pap smear up to date and dexa up to date. Counseled about sun safety and mole surveillance. Counseled about the dangers of distracted driving. Given 10 year screening recommendations.

## 2018-03-31 NOTE — Progress Notes (Signed)
   Subjective:    Patient ID: Margaret [name], female    DOB: 19-Jul-1955, 62 y.o.   MRN: [id number]  HPI The patient is a 62 YO female coming in for physical.   PMH, [place], social history reviewed and updated  Review of Systems  Constitutional: Negative.   HENT: Negative.   Eyes: Negative.   Respiratory: Negative for cough, chest tightness and shortness of breath.   Cardiovascular: Negative for chest pain, palpitations and leg swelling.  Gastrointestinal: Negative for abdominal distention, abdominal pain, constipation, diarrhea, nausea and vomiting.  Musculoskeletal: Negative.   Skin: Negative.   Neurological: Negative.   Psychiatric/Behavioral: Negative.       Objective:   Physical Exam Constitutional:      Appearance: She is well-developed.  HENT:     Head: Normocephalic and atraumatic.  Neck:     Musculoskeletal: Normal range of motion.  Cardiovascular:     Rate and Rhythm: Normal rate and regular rhythm.  Pulmonary:     Effort: Pulmonary effort is normal. No respiratory distress.     Breath sounds: Normal breath sounds. No wheezing or rales.  Abdominal:     General: Bowel sounds are normal. There is no distension.     Palpations: Abdomen is soft.     Tenderness: There is no abdominal tenderness. There is no rebound.  Skin:    General: Skin is warm and dry.  Neurological:     Mental Status: She is alert and oriented to person, place, and time.     Coordination: Coordination normal.    Vitals:   03/31/18 0804  BP: 120/80  Pulse: 77  Temp: 99.1 F (37.3 C)  TempSrc: Oral  SpO2: 95%  Weight: 232 lb (105.2 kg)  Height: 5\' 4"  (1.626 m)      Assessment & Plan:  Shingrix given IM

## 2018-04-04 ENCOUNTER — Encounter: Payer: Self-pay | Admitting: Internal Medicine

## 2018-04-04 MED ORDER — PRAVASTATIN SODIUM 40 MG PO TABS: 40.0000 mg | ORAL_TABLET | Freq: Every day | ORAL | 3 refills | Status: DC

## 2018-04-04 MED ORDER — METFORMIN HCL ER 500 MG PO TB24: 1000.0000 mg | ORAL_TABLET | Freq: Two times a day (BID) | ORAL | 1 refills | Status: DC

## 2018-04-09 ENCOUNTER — Other Ambulatory Visit: Payer: Self-pay | Admitting: Internal Medicine

## 2018-04-09 DIAGNOSIS — E1169 Type 2 diabetes mellitus with other specified complication: Secondary | ICD-10-CM

## 2018-04-09 DIAGNOSIS — E669 Obesity, unspecified: Principal | ICD-10-CM

## 2018-04-23 NOTE — Progress Notes (Signed)
[name] [address], [place] [address] Phone: [phone] Subjective:    I Margaret [name] am serving as a Education administrator for Dr. Hulan Saas.     CC: Right hip pain  FYB:[id number]  Margaret [name] is a 62 y.o. female coming in with complaint of right hip pain with exercise. Sat on some bleachers with the folded seat at a football game that caused pain in the hip. Exercising helps. Limping all day yesterday. Once she starts walking it is a little sore but is worse with sitting for long periods of time. Did not receive an injection last visit.      Past Medical History:  Diagnosis Date  . Arthritis   . Endometrial cancer ([place])   . Exercise-induced asthma   . Heart murmur   . History of bradycardia 2011   had an episode of HR 38 at time of blood work w/ palpitations and SOB,  work-up done by cardiology (dr dalton Aundra Dubin) stress echo normal on 01/ 2012 and (12/ 2011) holter monitor with PACs with average HR 68/  07-14-2017 per pt no issues since then  . Hx of colonic polyps    x3  . IBS (irritable bowel syndrome)   . OSA (obstructive sleep apnea) 07-14-2017 per had a new study on 07-12-2017 have not heard about results yet   per study 01-06-2005 moderate OSA (AHI 23.3/hr) used cpap, per pt lost weight withthe resolution of symptoms but has gain weight back  . Type 2 diabetes mellitus ([place])   . Wears glasses    Past Surgical History:  Procedure Laterality Date  . colonoscopy with polypectomy  2012 approx.  . DOBUTAMINE STRESS ECHO  04/2010   normal with no evidence of ischemia or infarction  . LAPAROSCOPIC CHOLECYSTECTOMY  1996  . PLANTAR FASCIA RELEASE    . ROBOTIC ASSISTED SUPRACERVICAL HYSTERECTOMY WITH BILATERAL SALPINGO OOPHERECTOMY N/A 07/19/2017   Procedure: XI ROBOTIC ASSISTED  HYSTERECTOMY WITH BILATERAL SALPINGO OOPHORECTOMY; SENTINEL LYMPH NODE BIOPSY;  Surgeon: Everitt Amber, MD;  Location: [facility];  Service: Gynecology;  Laterality:  N/A;  . SENTINEL NODE BIOPSY N/A 07/19/2017   Procedure: SENTINEL NODE BIOPSY;  Surgeon: Everitt Amber, MD;  Location: [facility];  Service: Gynecology;  Laterality: N/A;  . TRANSTHORACIC ECHOCARDIOGRAM  04/17/2010   ef 55%/  trivial AR and TR/ left LAE/     Social History   Socioeconomic History  . Marital status: Married    Spouse name: Not on file  . Number of children: Not on file  . Years of education: Not on file  . Highest education level: Not on file  Occupational History  . Occupation: Engineer, manufacturing: [employer]  . Financial resource strain: Not on file  . Food insecurity:    Worry: Not on file    Inability: Not on file  . Transportation needs:    Medical: Not on file    Non-medical: Not on file  Tobacco Use  . Smoking status: Former Smoker    Types: Cigarettes  . Smokeless tobacco: Never Used  . Tobacco comment: 07-14-2017 per pt Smoked [age] ONLY as teen  Substance and Sexual Activity  . Alcohol use: Yes    Comment: rarely  . Drug use: No  . Sexual activity: Not on file  Lifestyle  . Physical activity:    Days per week: Not on file    Minutes per session:  Not on file  . Stress: Not on file  Relationships  . Social connections:    Talks on phone: Not on file    Gets together: Not on file    Attends religious service: Not on file    Active member of club or organization: Not on file    Attends meetings of clubs or organizations: Not on file    Relationship status: Not on file  Other Topics Concern  . Not on file  Social History Narrative  . Not on file   Allergies  Allergen Reactions  . Amoxicillin Hives  . Other Anaphylaxis    TREE NUTS  . Penicillins     Hives Has patient had a PCN reaction causing immediate rash, facial/tongue/throat swelling, SOB or lightheadedness with hypotension: No Has patient had a PCN reaction causing severe rash involving mucus membranes or skin necrosis: No Has patient had a PCN  reaction that required hospitalization: No Has patient had a PCN reaction occurring within the last 10 years: No If all of the above answers are "NO", then may proceed with Cephalosporin use.   . Paroxetine Other (See Comments)    Headache , malaise   Family History  Problem Relation Age of Onset  . Colon polyps Father   . COPD Father   . Diabetes Father        diet controlled  . Heart attack Mother 2  . COPD Mother   . Lung cancer Mother   . Peripheral vascular disease Mother   . Hypertension Brother        2 bro  . Stroke Neg Hx     Current Outpatient Medications (Endocrine & Metabolic):  .  metFORMIN (GLUCOPHAGE XR) 500 MG 24 hr tablet, Take 2 tablets (1,000 mg total) by mouth 2 (two) times daily.  Current Outpatient Medications (Cardiovascular):  .  lisinopril (PRINIVIL,ZESTRIL) 2.5 MG tablet, Take 1 tablet (2.5 mg total) by mouth daily. .  pravastatin (PRAVACHOL) 40 MG tablet, Take 1 tablet (40 mg total) by mouth daily.  Current Outpatient Medications (Respiratory):  .  albuterol (PROVENTIL HFA;VENTOLIN HFA) 108 (90 Base) MCG/ACT inhaler, Inhale 2 puffs into the lungs every 6 (six) hours as needed for wheezing or shortness of breath.  Current Outpatient Medications (Analgesics):  .  naproxen sodium (ALEVE) 220 MG tablet, Take 440 mg by mouth as needed (PAIN).  Current Outpatient Medications (Hematological):  .  vitamin B-12 (CYANOCOBALAMIN) 500 MCG tablet, Take 500 mcg by mouth daily.  Current Outpatient Medications (Other):  Marland Kitchen  Calcium-Mag-Vit C-Vit D 185-28-2.5-200 CAPS,  .  Diclofenac Sodium (PENNSAID) 2 % SOLN, Place 2 application 2 (two) times daily onto the skin. Marland Kitchen  OVER THE COUNTER MEDICATION, Tumeric 500 mg bid . Marland Kitchen  OVER THE COUNTER MEDICATION, Cinnamon 500 mg daily.    Past medical history, social, surgical and family history all reviewed in electronic medical record.  No pertanent information unless stated regarding to the chief complaint.   Review of  Systems:  No headache, visual changes, nausea, vomiting, diarrhea, constipation, dizziness, abdominal pain, skin rash, fevers, chills, night sweats, weight loss, swollen lymph nodes, body aches, joint swelling, muscle aches, chest pain, shortness of breath, mood changes.   Objective  Blood pressure 110/70, pulse (!) 101, height 5\' 4"  (1.626 m), weight 228 lb (103.4 kg), SpO2 97 %.    General: No apparent distress alert and oriented x3 mood and affect normal, dressed appropriately.  HEENT: Pupils equal, extraocular movements intact  Respiratory: Patient's speak  in full sentences and does not appear short of breath  Cardiovascular: No lower extremity edema, non tender, no erythema  Skin: Warm dry intact with no signs of infection or rash on extremities or on axial skeleton.  Abdomen: Soft nontender  Neuro: Cranial nerves II through XII are intact, neurovascularly intact in all extremities with 2+ DTRs and 2+ pulses.  Lymph: No lymphadenopathy of posterior or anterior cervical chain or axillae bilaterally.  Gait normal with good balance and coordination.  MSK:  Non tender with full range of motion and good stability and symmetric strength and tone of shoulders, elbows, wrist, , knee and ankles bilaterally.  Right hip does have some severe tenderness to palpation of the lateral aspect of the hip.  Mild decrease in internal range of motion. Contralateral hip unremarkable.   Procedure: Real-time Ultrasound Guided Injection of right greater trochanteric bursitis secondary to patient's body habitus Device: GE Logiq Q7 Ultrasound guided injection is preferred based studies that show increased duration, increased effect, greater accuracy, decreased procedural pain, increased response rate, and decreased cost with ultrasound guided versus blind injection.  Verbal informed consent obtained.  Time-out conducted.  Noted no overlying erythema, induration, or other signs of local infection.  Skin prepped  in a sterile fashion.  Local anesthesia: Topical Ethyl chloride.  With sterile technique and under real time ultrasound guidance:  Greater trochanteric area was visualized and patient's bursa was noted. A 22-gauge 3 inch needle was inserted and 4 cc of 0.5% Marcaine and 1 cc of Kenalog 40 mg/dL was injected. Pictures taken Completed without difficulty  Pain immediately resolved suggesting accurate placement of the medication.  Advised to call if fevers/chills, erythema, induration, drainage, or persistent bleeding.  Images permanently stored and available for review in the ultrasound unit.  Impression: Technically successful ultrasound guided injection.   Impression and Recommendations:     This case required medical decision making of moderate complexity. The above documentation has been reviewed and is accurate and complete Margaret Pulley, DO       Note: This dictation was prepared with Dragon dictation along with smaller phrase technology. Any transcriptional errors that result from this process are unintentional.

## 2018-04-24 ENCOUNTER — Ambulatory Visit: Payer: Self-pay

## 2018-04-24 ENCOUNTER — Encounter: Payer: Self-pay | Admitting: Family Medicine

## 2018-04-24 ENCOUNTER — Ambulatory Visit (INDEPENDENT_AMBULATORY_CARE_PROVIDER_SITE_OTHER): Payer: BLUE CROSS/BLUE SHIELD | Admitting: Family Medicine

## 2018-04-24 VITALS — BP 110/70 | HR 101 | Ht 64.0 in | Wt 228.0 lb

## 2018-04-24 DIAGNOSIS — M7061 Trochanteric bursitis, right hip: Secondary | ICD-10-CM

## 2018-04-24 DIAGNOSIS — M25551 Pain in right hip: Secondary | ICD-10-CM

## 2018-04-24 NOTE — Assessment & Plan Note (Signed)
Patient was given injection today.  Discussed icing regimen and home exercise.  Discussed which activities to do which wants to avoid.  Increase activity as tolerated.  Follow-up again in 4 to 8 weeks

## 2018-04-24 NOTE — Patient Instructions (Addendum)
God to see you  Happy New Year!  Ice 20 minutes 2 times daily. Usually after activity and before bed. Exercises 3 times a week.  pennsaid pinkie amount topically 2 times daily as needed.  See me again in 4 weeks

## 2018-05-05 DIAGNOSIS — Z86010 Personal history of colonic polyps: Secondary | ICD-10-CM | POA: Diagnosis not present

## 2018-05-05 DIAGNOSIS — K644 Residual hemorrhoidal skin tags: Secondary | ICD-10-CM | POA: Diagnosis not present

## 2018-05-05 DIAGNOSIS — K319 Disease of stomach and duodenum, unspecified: Secondary | ICD-10-CM | POA: Diagnosis not present

## 2018-05-05 DIAGNOSIS — K228 Other specified diseases of esophagus: Secondary | ICD-10-CM | POA: Diagnosis not present

## 2018-05-05 DIAGNOSIS — D122 Benign neoplasm of ascending colon: Secondary | ICD-10-CM | POA: Diagnosis not present

## 2018-05-05 DIAGNOSIS — K3189 Other diseases of stomach and duodenum: Secondary | ICD-10-CM | POA: Diagnosis not present

## 2018-05-05 DIAGNOSIS — D120 Benign neoplasm of cecum: Secondary | ICD-10-CM | POA: Diagnosis not present

## 2018-05-05 DIAGNOSIS — R1310 Dysphagia, unspecified: Secondary | ICD-10-CM | POA: Diagnosis not present

## 2018-05-10 DIAGNOSIS — K319 Disease of stomach and duodenum, unspecified: Secondary | ICD-10-CM | POA: Diagnosis not present

## 2018-05-10 DIAGNOSIS — D122 Benign neoplasm of ascending colon: Secondary | ICD-10-CM | POA: Diagnosis not present

## 2018-05-10 DIAGNOSIS — K228 Other specified diseases of esophagus: Secondary | ICD-10-CM | POA: Diagnosis not present

## 2018-05-10 DIAGNOSIS — D120 Benign neoplasm of cecum: Secondary | ICD-10-CM | POA: Diagnosis not present

## 2018-06-06 ENCOUNTER — Encounter: Payer: Self-pay | Admitting: Internal Medicine

## 2018-06-21 ENCOUNTER — Encounter: Payer: Self-pay | Admitting: Internal Medicine

## 2018-06-22 ENCOUNTER — Ambulatory Visit: Payer: BLUE CROSS/BLUE SHIELD

## 2018-06-28 LAB — HM DIABETES EYE EXAM

## 2018-07-05 ENCOUNTER — Encounter: Payer: Self-pay | Admitting: Internal Medicine

## 2018-07-05 ENCOUNTER — Ambulatory Visit: Payer: BLUE CROSS/BLUE SHIELD | Admitting: Internal Medicine

## 2018-07-05 ENCOUNTER — Other Ambulatory Visit: Payer: Self-pay

## 2018-07-05 VITALS — BP 118/84 | HR 82 | Temp 98.8°F | Ht 64.0 in | Wt 221.0 lb

## 2018-07-05 DIAGNOSIS — Z23 Encounter for immunization: Secondary | ICD-10-CM

## 2018-07-05 DIAGNOSIS — E669 Obesity, unspecified: Secondary | ICD-10-CM

## 2018-07-05 DIAGNOSIS — E1169 Type 2 diabetes mellitus with other specified complication: Secondary | ICD-10-CM | POA: Diagnosis not present

## 2018-07-05 LAB — POCT GLYCOSYLATED HEMOGLOBIN (HGB A1C): Hemoglobin A1C: 7.4 % — AB (ref 4.0–5.6)

## 2018-07-05 NOTE — Progress Notes (Signed)
Abstracted and sent to scan  

## 2018-07-05 NOTE — Patient Instructions (Signed)
Your hgA1c is 7.4 today which is great!  Keep up the good work.

## 2018-07-05 NOTE — Progress Notes (Signed)
   Subjective:   Patient ID: Margaret [name], female    DOB: Dec 21, 1955, 63 y.o.   MRN: [id number]  HPI The patient is a 63 YO female coming in for follow up of her diabetes. She is taking metformin 2 pills twice a day since our last visit. She has changed diet some and is down about 12 pounds. She is now walking her dog daily which has helped. Denies new problems. Denies chest pains or SOB. Denies tingling or numbness in feet.   Review of Systems  Constitutional: Negative.   HENT: Negative.   Eyes: Negative.   Respiratory: Negative for cough, chest tightness and shortness of breath.   Cardiovascular: Negative for chest pain, palpitations and leg swelling.  Gastrointestinal: Negative for abdominal distention, abdominal pain, constipation, diarrhea, nausea and vomiting.  Musculoskeletal: Negative.   Skin: Negative.   Neurological: Negative.   Psychiatric/Behavioral: Negative.     Objective:  Physical Exam Constitutional:      Appearance: She is well-developed.  HENT:     Head: Normocephalic and atraumatic.  Neck:     Musculoskeletal: Normal range of motion.  Cardiovascular:     Rate and Rhythm: Normal rate and regular rhythm.  Pulmonary:     Effort: Pulmonary effort is normal. No respiratory distress.     Breath sounds: Normal breath sounds. No wheezing or rales.  Abdominal:     General: Bowel sounds are normal. There is no distension.     Palpations: Abdomen is soft.     Tenderness: There is no abdominal tenderness. There is no rebound.  Skin:    General: Skin is warm and dry.  Neurological:     Mental Status: She is alert and oriented to person, place, and time.     Coordination: Coordination normal.     Vitals:   07/05/18 0758  BP: 118/84  Pulse: 82  Temp: 98.8 F (37.1 C)  TempSrc: Oral  SpO2: 96%  Weight: 221 lb (100.2 kg)  Height: 5\' 4"  (1.626 m)    Assessment & Plan:  Shingrix IM given at visit

## 2018-07-05 NOTE — Assessment & Plan Note (Signed)
POC HgA1c done in the office which was much improved at 7.4 and is at goal. Will continue metformin 1000 mg BID. Encouraged her to stick with weight loss and exercise to maintain this benefit.

## 2018-08-03 ENCOUNTER — Ambulatory Visit: Payer: BLUE CROSS/BLUE SHIELD | Admitting: Gynecologic Oncology

## 2018-09-22 ENCOUNTER — Other Ambulatory Visit: Payer: Self-pay

## 2018-09-22 ENCOUNTER — Inpatient Hospital Stay: Payer: BC Managed Care – PPO | Attending: Gynecologic Oncology | Admitting: Gynecologic Oncology

## 2018-09-22 ENCOUNTER — Encounter: Payer: Self-pay | Admitting: Gynecologic Oncology

## 2018-09-22 VITALS — BP 113/82 | HR 96 | Temp 98.0°F | Resp 18 | Ht 64.0 in | Wt 223.1 lb

## 2018-09-22 DIAGNOSIS — Z90722 Acquired absence of ovaries, bilateral: Secondary | ICD-10-CM | POA: Insufficient documentation

## 2018-09-22 DIAGNOSIS — Z90710 Acquired absence of both cervix and uterus: Secondary | ICD-10-CM | POA: Diagnosis not present

## 2018-09-22 DIAGNOSIS — C541 Malignant neoplasm of endometrium: Secondary | ICD-10-CM | POA: Insufficient documentation

## 2018-09-22 NOTE — Patient Instructions (Signed)
Please notify Dr Denman George at phone number [phone] if you notice vaginal bleeding, new pelvic or abdominal pains, bloating, feeling full easy, or a change in bladder or bowel function.   Please contact Dr Serita Grit office (at [phone]) in January, 2021 to request an appointment with her for April through June, 2021.

## 2018-09-22 NOTE — Progress Notes (Signed)
Follow-up Note: Gyn-Onc  Consult was requested by Dr. Stann Mainland for the evaluation of Margaret [name] 63 y.o. female  CC:  Chief Complaint  Patient presents with  . Endometrial cancer [facility])    Assessment/Plan:  [name] is a 63 y.o. with stage IA grade 1 endometrioid endometrial adenocarcinoma (MSI stable/normal).   Pathology revealed low risk factors for recurrence, despite ITC's seen in a SLN, therefore no adjuvant therapy is recommended according to NCCN guidelines.  She remains NED on exam.  I discussed risk for recurrence and typical symptoms encouraged her to notify us of these should they develop between visits.  I recommend she continue to have follow-up every 6 months for 5 years in accordance with NCCN guidelines. Those visits should include symptom assessment, physical exam and pelvic examination. Pap smears are not indicated or recommended in the routine surveillance of endometrial cancer.  She will see me again in April, 2021, and see Dr Stann Mainland annually in October.    HPI: [name] is a 63 y.o. Gravida 1 para 1 last menstrual period at approximately 63 years of age.  She noted an episode of vaginal bleeding approximately 3 weeks ago pelvic ultrasound on May 24, 2017 notable for an endometrium that was thickened and hypervascular uterus measured 9.3 cm in greatest length of the left ovary was within normal limits comment was made regarding the right ovary.  A 1.6 cm fibroid was appreciated.  I do endometrial biopsy was collected and notable for a fragmented specimen with at least endometrial intraepithelial neoplasia and features highly suspicious of endometrioid carcinoma.  She has been on Micronase progesterone since the vaginal bleeding and takes 200 mg daily.  [name] states that the vaginal bleeding has markedly decreased she reports 15 pound weight loss in the last month. Her family history is unremarkable for associated malignancy.  Interval  hx: On July 19, 2017 [name] underwent a robotic assisted total hysterectomy BSO and sentinel lymph node biopsy.  Intraoperative findings were unremarkable and no extrauterine disease was apparent.  Final pathology revealed a 2.5 cm grade 1 endometrioid adenocarcinoma which invaded the myometrium 0.6 cm of 1.2 cm thickness.  There is no cervical stromal involvement no other involvement of the fallopian tubes and ovaries.  Lymphovascular space invasion was not identified.  Evaluation of the sentinel lymph nodes with immunohistochemistry revealed isolated tumor cells involving a single lymph node in the right obturated space.  Immunostains revealed that this was not MSI stable/normal tumor.  The patient's case was presented at tumor board.  Given the otherwise low risk features and her primary uterine tumor, and the uncertain clinical significance of isolated tumor cells, the recommendation was for close observation and no adjuvant therapy at this time in accordance with NCCN guidelines.  Since surgery she is doing well with no complaints.   Review of Systems:  Constitutional  Feels well, Cardiovascular  No chest pain, shortness of breath, or edema  Pulmonary  No cough or wheeze.  Gastro Intestinal  No nausea, vomitting, or diarrhoea. No bright red blood per rectum, no abdominal pain, change in bowel movement, or constipation.  Genito Urinary  No frequency, urgency, dysuria, no vaginal spotting  Musculo Skeletal  No myalgia, arthralgia, joint swelling or pain  Neurologic  No weakness, numbness, change in gait,  Psychology  No depression, anxiety, insomnia.    Current Meds:  Outpatient Encounter Medications as of 09/22/2018  Medication Sig  . albuterol (PROVENTIL HFA;VENTOLIN HFA) 108 (  90 Base) MCG/ACT inhaler Inhale 2 puffs into the lungs every 6 (six) hours as needed for wheezing or shortness of breath.  . Calcium-Mag-Vit C-Vit D 185-28-2.5-200 CAPS   . Diclofenac Sodium (PENNSAID) 2 %  SOLN Place 2 application 2 (two) times daily onto the skin.  Marland Kitchen lisinopril (PRINIVIL,ZESTRIL) 2.5 MG tablet Take 1 tablet (2.5 mg total) by mouth daily.  . metFORMIN (GLUCOPHAGE XR) 500 MG 24 hr tablet Take 2 tablets (1,000 mg total) by mouth 2 (two) times daily.  . naproxen sodium (ALEVE) 220 MG tablet Take 440 mg by mouth as needed (PAIN).  Marland Kitchen pravastatin (PRAVACHOL) 40 MG tablet Take 1 tablet (40 mg total) by mouth daily.  . vitamin B-12 (CYANOCOBALAMIN) 500 MCG tablet Take 500 mcg by mouth daily.  . [DISCONTINUED] OVER THE COUNTER MEDICATION Tumeric 500 mg bid .  . [DISCONTINUED] OVER THE COUNTER MEDICATION Cinnamon 500 mg daily.   No facility-administered encounter medications on file as of 09/22/2018.     Allergy:  Allergies  Allergen Reactions  . Amoxicillin Hives  . Other Anaphylaxis    TREE NUTS  . Penicillins     Hives Has patient had a PCN reaction causing immediate rash, facial/tongue/throat swelling, SOB or lightheadedness with hypotension: No Has patient had a PCN reaction causing severe rash involving mucus membranes or skin necrosis: No Has patient had a PCN reaction that required hospitalization: No Has patient had a PCN reaction occurring within the last 10 years: No If all of the above answers are "NO", then may proceed with Cephalosporin use.   [name]  [Nitrofurantoin Macrocrystal] Rash  . Paroxetine Other (See Comments)    Headache , malaise    Social Hx:   Social History   Socioeconomic History  . Marital status: Married    Spouse name: Not on file  . Number of children: Not on file  . Years of education: Not on file  . Highest education level: Not on file  Occupational History  . Occupation: Engineer, manufacturing: [employer]  . Financial resource strain: Not on file  . Food insecurity:    Worry: Not on file    Inability: Not on file  . Transportation needs:    Medical: Not on file    Non-medical: Not on  file  Tobacco Use  . Smoking status: Former Smoker    Types: Cigarettes  . Smokeless tobacco: Never Used  . Tobacco comment: 07-14-2017 per pt Smoked [age] ONLY as teen  Substance and Sexual Activity  . Alcohol use: Yes    Comment: rarely  . Drug use: No  . Sexual activity: Not on file  Lifestyle  . Physical activity:    Days per week: Not on file    Minutes per session: Not on file  . Stress: Not on file  Relationships  . Social connections:    Talks on phone: Not on file    Gets together: Not on file    Attends religious service: Not on file    Active member of club or organization: Not on file    Attends meetings of clubs or organizations: Not on file    Relationship status: Not on file  . Intimate partner violence:    Fear of current or ex partner: Not on file    Emotionally abused: Not on file    Physically abused: Not on file    Forced sexual activity: Not on file  Other Topics Concern  . Not on file  Social History Narrative  . Not on file    Past Surgical Hx:  Past Surgical History:  Procedure Laterality Date  . colonoscopy with polypectomy  2012 approx.  . DOBUTAMINE STRESS ECHO  04/2010   normal with no evidence of ischemia or infarction  . LAPAROSCOPIC CHOLECYSTECTOMY  1996  . PLANTAR FASCIA RELEASE    . ROBOTIC ASSISTED SUPRACERVICAL HYSTERECTOMY WITH BILATERAL SALPINGO OOPHERECTOMY N/A 07/19/2017   Procedure: XI ROBOTIC ASSISTED  HYSTERECTOMY WITH BILATERAL SALPINGO OOPHORECTOMY; SENTINEL LYMPH NODE BIOPSY;  Surgeon: Everitt Amber, MD;  Location: [facility];  Service: Gynecology;  Laterality: N/A;  . SENTINEL NODE BIOPSY N/A 07/19/2017   Procedure: SENTINEL NODE BIOPSY;  Surgeon: Everitt Amber, MD;  Location: [facility];  Service: Gynecology;  Laterality: N/A;  . TRANSTHORACIC ECHOCARDIOGRAM  04/17/2010   ef 55%/  trivial AR and TR/ left LAE/      Past Medical Hx:  Past Medical History:  Diagnosis Date  . Arthritis   . Endometrial cancer ([place])   . Exercise-induced  asthma   . Heart murmur   . History of bradycardia 2011   had an episode of HR 38 at time of blood work w/ palpitations and SOB,  work-up done by cardiology (dr dalton Aundra Dubin) stress echo normal on 01/ 2012 and (12/ 2011) holter monitor with PACs with average HR 68/  07-14-2017 per pt no issues since then  . Hx of colonic polyps    x3  . IBS (irritable bowel syndrome)   . OSA (obstructive sleep apnea) 07-14-2017 per had a new study on 07-12-2017 have not heard about results yet   per study 01-06-2005 moderate OSA (AHI 23.3/hr) used cpap, per pt lost weight withthe resolution of symptoms but has gain weight back  . Type 2 diabetes mellitus ([place])   . Wears glasses   Last colonoscopy 6 years ago with removal of polyps.  Last mammogram October 2018  Past Gynecological History: Menarche age 74 menses every month.  Reports significant dysmenorrhea until her 41s.  Menopause at age 39.   Reports oral contraceptive pill use for over 20 years.  Denies a history of an abnormal Pap test  family Hx:  Family History  Problem Relation Age of Onset  . Colon polyps Father   . COPD Father   . Diabetes Father        diet controlled  . Heart attack Mother 65  . COPD Mother   . Lung cancer Mother   . Peripheral vascular disease Mother   . Hypertension Brother        2 bro  . Stroke Neg Hx     Vitals:  Blood pressure 113/82, pulse 96, temperature 98 F (36.7 C), temperature source Oral, resp. rate 18, height '5\' 4"'$  (1.626 m), weight 223 lb 1.6 oz (101.2 kg), SpO2 98 %. Body mass index is 38.3 kg/m.   Physical Exam: WD in NAD Neck  Supple NROM, without any enlargements.  Lymph Node Survey No cervical supraclavicular or inguinal adenopathy Cardiovascular  Pulse normal rate, regularity and rhythm. S1 and S2 normal.  Lungs  Clear to auscultation bilaterally, without wheezes/crackles/rhonchi. Good air movement.  Skin  No rash/lesions/breakdown  Psychiatry  Alert and oriented appropriate mood  affect speech and reasoning. Abdomen  Normoactive bowel sounds, abdomen soft, non-tender, without evidence of hernia. Well healed in cisions.  Back No CVA tenderness Genito Urinary  External genitalia normal Vagina: cuff well healed, no separation  or lesions. No bleeding. No masses. Surgically absent uterus and cervix. Rectal  deferred Extremities  No bilateral cyanosis, clubbing or edema.  Thereasa Solo, MD 09/22/2018, 2:51 PM

## 2018-09-27 ENCOUNTER — Other Ambulatory Visit: Payer: Self-pay | Admitting: Internal Medicine

## 2018-12-11 ENCOUNTER — Encounter: Payer: Self-pay | Admitting: Internal Medicine

## 2018-12-11 DIAGNOSIS — Z20822 Contact with and (suspected) exposure to covid-19: Secondary | ICD-10-CM

## 2018-12-11 DIAGNOSIS — Z1159 Encounter for screening for other viral diseases: Secondary | ICD-10-CM | POA: Diagnosis not present

## 2019-01-06 DIAGNOSIS — Z23 Encounter for immunization: Secondary | ICD-10-CM | POA: Diagnosis not present

## 2019-02-26 DIAGNOSIS — Z1231 Encounter for screening mammogram for malignant neoplasm of breast: Secondary | ICD-10-CM | POA: Diagnosis not present

## 2019-02-26 DIAGNOSIS — Z6838 Body mass index (BMI) 38.0-38.9, adult: Secondary | ICD-10-CM | POA: Diagnosis not present

## 2019-02-26 DIAGNOSIS — Z01419 Encounter for gynecological examination (general) (routine) without abnormal findings: Secondary | ICD-10-CM | POA: Diagnosis not present

## 2019-02-28 ENCOUNTER — Encounter: Payer: Self-pay | Admitting: Internal Medicine

## 2019-02-28 LAB — HM MAMMOGRAPHY

## 2019-02-28 NOTE — Progress Notes (Signed)
Abstracted and sent to scan  

## 2019-03-07 ENCOUNTER — Ambulatory Visit: Payer: BC Managed Care – PPO | Admitting: Internal Medicine

## 2019-03-20 ENCOUNTER — Ambulatory Visit (INDEPENDENT_AMBULATORY_CARE_PROVIDER_SITE_OTHER): Payer: BC Managed Care – PPO | Admitting: Family Medicine

## 2019-03-20 ENCOUNTER — Encounter: Payer: Self-pay | Admitting: Family Medicine

## 2019-03-20 ENCOUNTER — Ambulatory Visit: Payer: Self-pay

## 2019-03-20 ENCOUNTER — Other Ambulatory Visit: Payer: Self-pay

## 2019-03-20 VITALS — BP 110/82 | HR 87 | Ht 64.0 in | Wt 225.0 lb

## 2019-03-20 DIAGNOSIS — M25551 Pain in right hip: Secondary | ICD-10-CM | POA: Diagnosis not present

## 2019-03-20 DIAGNOSIS — M7061 Trochanteric bursitis, right hip: Secondary | ICD-10-CM | POA: Diagnosis not present

## 2019-03-20 NOTE — Assessment & Plan Note (Signed)
Repeat injection given today.  Tolerated the procedure well, discussed icing regimen and home exercise, discussed which activities to do which wants to avoid.  Topical anti-inflammatories given.  Discussed in 4 to 8 weeks for further evaluation and treatment.

## 2019-03-20 NOTE — Patient Instructions (Addendum)
Good to see you Ice is your friend Wear good shoes Biking if possible Exercise 3 times a week See me again in 6-8 weeks   [address] [place], [place] [address] Phone [phone]

## 2019-03-20 NOTE — Progress Notes (Signed)
[name] [place],  [address] Phone: [phone] Subjective:   [name], am serving as a scribe for Dr. Hulan Saas. This visit occurred during the SARS-CoV-2 public health emergency.  Safety protocols were in place, including screening questions prior to the visit, additional usage of staff PPE, and extensive cleaning of exam room while observing appropriate contact time as indicated for disinfecting solutions.    CC: Right hip pain  [id number]   04/24/2018 Patient was given injection today.  Discussed icing regimen and home exercise.  Discussed which activities to do which wants to avoid.  Increase activity as tolerated.  Follow-up again in 4 to 8 weeks  Update 03/20/2019 Margaret [name] is a 63 y.o. female coming in with complaint of hip pain. Patient states that she had injection 1 year ago and this helped to alleviate her pain. Has been walking 3 miles a day. Pain began increasing in October 2020. Pain over the greater trochanter. Can occasionally have low back pain as well.      Past Medical History:  Diagnosis Date  . Arthritis   . Endometrial cancer ([place])   . Exercise-induced asthma   . Heart murmur   . History of bradycardia 2011   had an episode of HR 38 at time of blood work w/ palpitations and SOB,  work-up done by cardiology (dr dalton Aundra Dubin) stress echo normal on 01/ 2012 and (12/ 2011) holter monitor with PACs with average HR 68/  07-14-2017 per pt no issues since then  . Hx of colonic polyps    x3  . IBS (irritable bowel syndrome)   . OSA (obstructive sleep apnea) 07-14-2017 per had a new study on 07-12-2017 have not heard about results yet   per study 01-06-2005 moderate OSA (AHI 23.3/hr) used cpap, per pt lost weight withthe resolution of symptoms but has gain weight back  . Type 2 diabetes mellitus ([place])   . Wears glasses    Past Surgical History:  Procedure Laterality Date  . colonoscopy with  polypectomy  2012 approx.  . DOBUTAMINE STRESS ECHO  04/2010   normal with no evidence of ischemia or infarction  . LAPAROSCOPIC CHOLECYSTECTOMY  1996  . PLANTAR FASCIA RELEASE    . ROBOTIC ASSISTED SUPRACERVICAL HYSTERECTOMY WITH BILATERAL SALPINGO OOPHERECTOMY N/A 07/19/2017   Procedure: XI ROBOTIC ASSISTED  HYSTERECTOMY WITH BILATERAL SALPINGO OOPHORECTOMY; SENTINEL LYMPH NODE BIOPSY;  Surgeon: Everitt Amber, MD;  Location: [facility];  Service: Gynecology;  Laterality: N/A;  . SENTINEL NODE BIOPSY N/A 07/19/2017   Procedure: SENTINEL NODE BIOPSY;  Surgeon: Everitt Amber, MD;  Location: [facility];  Service: Gynecology;  Laterality: N/A;  . TRANSTHORACIC ECHOCARDIOGRAM  04/17/2010   ef 55%/  trivial AR and TR/ left LAE/     Social History   Socioeconomic History  . Marital status: Divorced    Spouse name: Not on file  . Number of children: Not on file  . Years of education: Not on file  . Highest education level: Not on file  Occupational History  . Occupation: Engineer, manufacturing: [employer]  . Financial resource strain: Not on file  . Food insecurity    Worry: Not on file    Inability: Not on file  . Transportation needs    Medical: Not on file    Non-medical: Not on file  Tobacco Use  . Smoking status: Former Smoker  Types: Cigarettes  . Smokeless tobacco: Never Used  . Tobacco comment: 07-14-2017 per pt Smoked [age] ONLY as teen  Substance and Sexual Activity  . Alcohol use: Yes    Comment: rarely  . Drug use: No  . Sexual activity: Not on file  Lifestyle  . Physical activity    Days per week: Not on file    Minutes per session: Not on file  . Stress: Not on file  Relationships  . Social Herbalist on phone: Not on file    Gets together: Not on file    Attends religious service: Not on file    Active member of club or organization: Not on file    Attends meetings of clubs or organizations: Not on file    Relationship status:  Not on file  Other Topics Concern  . Not on file  Social History Narrative  . Not on file   Allergies  Allergen Reactions  . Amoxicillin Hives  . Other Anaphylaxis    TREE NUTS  . Penicillins     Hives Has patient had a PCN reaction causing immediate rash, facial/tongue/throat swelling, SOB or lightheadedness with hypotension: No Has patient had a PCN reaction causing severe rash involving mucus membranes or skin necrosis: No Has patient had a PCN reaction that required hospitalization: No Has patient had a PCN reaction occurring within the last 10 years: No If all of the above answers are "NO", then may proceed with Cephalosporin use.   [name]  [Nitrofurantoin Macrocrystal] Rash  . Paroxetine Other (See Comments)    Headache , malaise   Family History  Problem Relation Age of Onset  . Colon polyps Father   . COPD Father   . Diabetes Father        diet controlled  . Heart attack Mother 16  . COPD Mother   . Lung cancer Mother   . Peripheral vascular disease Mother   . Hypertension Brother        2 bro  . Stroke Neg Hx     Current Outpatient Medications (Endocrine & Metabolic):  .  metFORMIN (GLUCOPHAGE-XR) 500 MG 24 hr tablet, TAKE 2 TABLETS BY MOUTH TWICE A DAY  Current Outpatient Medications (Cardiovascular):  .  lisinopril (PRINIVIL,ZESTRIL) 2.5 MG tablet, Take 1 tablet (2.5 mg total) by mouth daily. .  pravastatin (PRAVACHOL) 40 MG tablet, Take 1 tablet (40 mg total) by mouth daily.  Current Outpatient Medications (Respiratory):  .  albuterol (PROVENTIL HFA;VENTOLIN HFA) 108 (90 Base) MCG/ACT inhaler, Inhale 2 puffs into the lungs every 6 (six) hours as needed for wheezing or shortness of breath.  Current Outpatient Medications (Analgesics):  .  naproxen sodium (ALEVE) 220 MG tablet, Take 440 mg by mouth as needed (PAIN).  Current Outpatient Medications (Hematological):  .  vitamin B-12 (CYANOCOBALAMIN) 500 MCG tablet, Take 500 mcg by mouth daily.   Current Outpatient Medications (Other):  Marland Kitchen  Calcium-Mag-Vit C-Vit D 185-28-2.5-200 CAPS,  .  Diclofenac Sodium (PENNSAID) 2 % SOLN, Place 2 application 2 (two) times daily onto the skin.    Past medical history, social, surgical and family history all reviewed in electronic medical record.  No pertanent information unless stated regarding to the chief complaint.   Review of Systems:  No headache, visual changes, nausea, vomiting, diarrhea, constipation, dizziness, abdominal pain, skin rash, fevers, chills, night sweats, weight loss, swollen lymph nodes, body aches, joint swelling,  chest pain, shortness of breath, mood changes.  Positive muscle  aches  Objective  Blood pressure 110/82, pulse 87, height 5\' 4"  (1.626 m), weight 225 lb (102.1 kg), SpO2 99 %.    General: No apparent distress alert and oriented x3 mood and affect normal, dressed appropriately.  HEENT: Pupils equal, extraocular movements intact  Respiratory: Patient's speak in full sentences and does not appear short of breath  Cardiovascular: No lower extremity edema, non tender, no erythema  Skin: Warm dry intact with no signs of infection or rash on extremities or on axial skeleton.  Abdomen: Soft nontender  Neuro: Cranial nerves II through XII are intact, neurovascularly intact in all extremities with 2+ DTRs and 2+ pulses.  Lymph: No lymphadenopathy of posterior or anterior cervical chain or axillae bilaterally.  Gait normal with good balance and coordination.  MSK:  Non tender with full range of motion and good stability and symmetric strength and tone of shoulders, elbows, wrist, , knee and ankles bilaterally.  Right hip exam shows the patient is some tenderness to palpation over the greater trochanteric area minorly over the right gluteal area.  Patient does have some weakness of the core strength compared to previous exam.  Hip Rehabilitation Protocol:  1.  Side leg raises.  3x30 with no weight, then 3x15 with 2 lb  ankle weight, then 3x15 with 5 lb ankle weight 2.  Standing hip rotation.  3x30 with no weight, then 3x15 with 2 lb ankle weight, then 3x15 with 5 lb ankle weight. 3.  Side step ups.  3x30 with no weight, then 3x15 with 5 lbs in backpack, then 3x15 with 10 lbs in backpack.  Weakness with 4 out of 5 strength but fairly symmetric to the contralateral side.   Procedure: Real-time Ultrasound Guided Injection of right greater trochanteric bursitis secondary to patient's body habitus Device: GE Logiq Q7 Ultrasound guided injection is preferred based studies that show increased duration, increased effect, greater accuracy, decreased procedural pain, increased response rate, and decreased cost with ultrasound guided versus blind injection.  Verbal informed consent obtained.  Time-out conducted.  Noted no overlying erythema, induration, or other signs of local infection.  Skin prepped in a sterile fashion.  Local anesthesia: Topical Ethyl chloride.  With sterile technique and under real time ultrasound guidance:  Greater trochanteric area was visualized and patient's bursa was noted. A 22-gauge 3 inch needle was inserted and 4 cc of 0.5% Marcaine and 1 cc of Kenalog 40 mg/dL was injected. Pictures taken Completed without difficulty  Pain immediately resolved suggesting accurate placement of the medication.  Advised to call if fevers/chills, erythema, induration, drainage, or persistent bleeding.  Images permanently stored and available for review in the ultrasound unit.  Impression: Technically successful ultrasound guided injection.    Impression and Recommendations:     This case required medical decision making of moderate complexity. The above documentation has been reviewed and is accurate and complete Lyndal Pulley, DO       Note: This dictation was prepared with Dragon dictation along with smaller phrase technology. Any transcriptional errors that result from this process are  unintentional.

## 2019-03-30 ENCOUNTER — Encounter: Payer: Self-pay | Admitting: Internal Medicine

## 2019-03-30 MED ORDER — PRAVASTATIN SODIUM 40 MG PO TABS: 40.0000 mg | ORAL_TABLET | Freq: Every day | ORAL | 0 refills | Status: DC

## 2019-04-05 ENCOUNTER — Other Ambulatory Visit: Payer: Self-pay | Admitting: Internal Medicine

## 2019-04-05 DIAGNOSIS — E1169 Type 2 diabetes mellitus with other specified complication: Secondary | ICD-10-CM

## 2019-04-08 DIAGNOSIS — Z20828 Contact with and (suspected) exposure to other viral communicable diseases: Secondary | ICD-10-CM | POA: Diagnosis not present

## 2019-04-08 DIAGNOSIS — Z03818 Encounter for observation for suspected exposure to other biological agents ruled out: Secondary | ICD-10-CM | POA: Diagnosis not present

## 2019-04-23 ENCOUNTER — Encounter: Payer: BC Managed Care – PPO | Admitting: Internal Medicine

## 2019-04-30 ENCOUNTER — Ambulatory Visit (INDEPENDENT_AMBULATORY_CARE_PROVIDER_SITE_OTHER): Payer: BC Managed Care – PPO | Admitting: Internal Medicine

## 2019-04-30 ENCOUNTER — Encounter: Payer: Self-pay | Admitting: Internal Medicine

## 2019-04-30 ENCOUNTER — Other Ambulatory Visit: Payer: Self-pay

## 2019-04-30 VITALS — BP 126/84 | HR 100 | Temp 98.3°F | Ht 64.0 in | Wt 227.0 lb

## 2019-04-30 DIAGNOSIS — E669 Obesity, unspecified: Secondary | ICD-10-CM | POA: Diagnosis not present

## 2019-04-30 DIAGNOSIS — Z23 Encounter for immunization: Secondary | ICD-10-CM

## 2019-04-30 DIAGNOSIS — Z0000 Encounter for general adult medical examination without abnormal findings: Secondary | ICD-10-CM

## 2019-04-30 DIAGNOSIS — E1169 Type 2 diabetes mellitus with other specified complication: Secondary | ICD-10-CM

## 2019-04-30 DIAGNOSIS — E785 Hyperlipidemia, unspecified: Secondary | ICD-10-CM

## 2019-04-30 LAB — COMPREHENSIVE METABOLIC PANEL
ALT: 28 U/L (ref 0–35)
AST: 20 U/L (ref 0–37)
Albumin: 4 g/dL (ref 3.5–5.2)
Alkaline Phosphatase: 71 U/L (ref 39–117)
BUN: 23 mg/dL (ref 6–23)
CO2: 29 mEq/L (ref 19–32)
Calcium: 9.5 mg/dL (ref 8.4–10.5)
Chloride: 103 mEq/L (ref 96–112)
Creatinine, Ser: 0.88 mg/dL (ref 0.40–1.20)
GFR: 65.24 mL/min (ref >60.00)
Glucose, Bld: 226 mg/dL — ABNORMAL HIGH (ref 70–99)
Potassium: 3.8 mEq/L (ref 3.5–5.1)
Sodium: 139 mEq/L (ref 135–145)
Total Bilirubin: 0.3 mg/dL (ref 0.2–1.2)
Total Protein: 6.9 g/dL (ref 6.0–8.3)

## 2019-04-30 LAB — MICROALBUMIN / CREATININE URINE RATIO
Creatinine,U: 129.5 mg/dL
Microalb Creat Ratio: 0.8 mg/g (ref 0.0–30.0)
Microalb, Ur: 1 mg/dL (ref 0.0–1.9)

## 2019-04-30 LAB — CBC
HCT: 44.1 % (ref 36.0–46.0)
Hemoglobin: 14.5 g/dL (ref 12.0–15.0)
MCHC: 32.8 g/dL (ref 30.0–36.0)
MCV: 84.1 fl (ref 78.0–100.0)
Platelets: 291 10*3/uL (ref 150.0–400.0)
RBC: 5.24 Mil/uL — ABNORMAL HIGH (ref 3.87–5.11)
RDW: 13.3 % (ref 11.5–15.5)
WBC: 6.9 10*3/uL (ref 4.0–10.5)

## 2019-04-30 LAB — LIPID PANEL
Cholesterol: 183 mg/dL (ref 0–200)
HDL: 63.2 mg/dL (ref >39.00)
NonHDL: 119.5
Total CHOL/HDL Ratio: 3
Triglycerides: 276 mg/dL — ABNORMAL HIGH (ref 0.0–149.0)
VLDL: 55.2 mg/dL — ABNORMAL HIGH (ref 0.0–40.0)

## 2019-04-30 LAB — LDL CHOLESTEROL, DIRECT: Direct LDL: 100 mg/dL

## 2019-04-30 LAB — HEMOGLOBIN A1C: Hgb A1c MFr Bld: 9.1 % — ABNORMAL HIGH (ref 4.6–6.5)

## 2019-04-30 NOTE — Assessment & Plan Note (Signed)
Taking metformin 1000 mg BID, ACE-I and statin. Foot exam done today. Checking microalbumin to creatinine ration and HgA1c. Reminded about yearly eye exam. Adjust as needed.

## 2019-04-30 NOTE — Patient Instructions (Signed)
Health Maintenance, Female Adopting a healthy lifestyle and getting preventive care are important in promoting health and wellness. Ask your health care provider about:  The right schedule for you to have regular tests and exams.  Things you can do on your own to prevent diseases and keep yourself healthy. What should I know about diet, weight, and exercise? Eat a healthy diet   Eat a diet that includes plenty of vegetables, fruits, low-fat dairy products, and lean protein.  Do not eat a lot of foods that are high in solid fats, added sugars, or sodium. Maintain a healthy weight Body mass index (BMI) is used to identify weight problems. It estimates body fat based on height and weight. Your health care provider can help determine your BMI and help you achieve or maintain a healthy weight. Get regular exercise Get regular exercise. This is one of the most important things you can do for your health. Most adults should:  Exercise for at least 150 minutes each week. The exercise should increase your heart rate and make you sweat (moderate-intensity exercise).  Do strengthening exercises at least twice a week. This is in addition to the moderate-intensity exercise.  Spend less time sitting. Even light physical activity can be beneficial. Watch cholesterol and blood lipids Have your blood tested for lipids and cholesterol at 62 years of age, then have this test every 5 years. Have your cholesterol levels checked more often if:  Your lipid or cholesterol levels are high.  You are older than 62 years of age.  You are at high risk for heart disease. What should I know about cancer screening? Depending on your health history and family history, you may need to have cancer screening at various ages. This may include screening for:  Breast cancer.  Cervical cancer.  Colorectal cancer.  Skin cancer.  Lung cancer. What should I know about heart disease, diabetes, and high blood  pressure? Blood pressure and heart disease  High blood pressure causes heart disease and increases the risk of stroke. This is more likely to develop in people who have high blood pressure readings, are of African descent, or are overweight.  Have your blood pressure checked: ? Every 3-5 years if you are 18-39 years of age. ? Every year if you are 40 years old or older. Diabetes Have regular diabetes screenings. This checks your fasting blood sugar level. Have the screening done:  Once every three years after age 40 if you are at a normal weight and have a low risk for diabetes.  More often and at a younger age if you are overweight or have a high risk for diabetes. What should I know about preventing infection? Hepatitis B If you have a higher risk for hepatitis B, you should be screened for this virus. Talk with your health care provider to find out if you are at risk for hepatitis B infection. Hepatitis C Testing is recommended for:  Everyone born from 1945 through 1965.  Anyone with known risk factors for hepatitis C. Sexually transmitted infections (STIs)  Get screened for STIs, including gonorrhea and chlamydia, if: ? You are sexually active and are younger than 62 years of age. ? You are older than 62 years of age and your health care provider tells you that you are at risk for this type of infection. ? Your sexual activity has changed since you were last screened, and you are at increased risk for chlamydia or gonorrhea. Ask your health care provider if   you are at risk.  Ask your health care provider about whether you are at high risk for HIV. Your health care provider may recommend a prescription medicine to help prevent HIV infection. If you choose to take medicine to prevent HIV, you should first get tested for HIV. You should then be tested every 3 months for as long as you are taking the medicine. Pregnancy  If you are about to stop having your period (premenopausal) and  you may become pregnant, seek counseling before you get pregnant.  Take 400 to 800 micrograms (mcg) of folic acid every day if you become pregnant.  Ask for birth control (contraception) if you want to prevent pregnancy. Osteoporosis and menopause Osteoporosis is a disease in which the bones lose minerals and strength with aging. This can result in bone fractures. If you are 65 years old or older, or if you are at risk for osteoporosis and fractures, ask your health care provider if you should:  Be screened for bone loss.  Take a calcium or vitamin D supplement to lower your risk of fractures.  Be given hormone replacement therapy (HRT) to treat symptoms of menopause. Follow these instructions at home: Lifestyle  Do not use any products that contain nicotine or tobacco, such as cigarettes, e-cigarettes, and chewing tobacco. If you need help quitting, ask your health care provider.  Do not use street drugs.  Do not share needles.  Ask your health care provider for help if you need support or information about quitting drugs. Alcohol use  Do not drink alcohol if: ? Your health care provider tells you not to drink. ? You are pregnant, may be pregnant, or are planning to become pregnant.  If you drink alcohol: ? Limit how much you use to 0-1 drink a day. ? Limit intake if you are breastfeeding.  Be aware of how much alcohol is in your drink. In the [place] one drink equals one 12 oz bottle of beer (355 mL), one 5 oz glass of wine (148 mL), or one 1 oz glass of hard liquor (44 mL). General instructions  Schedule regular health, dental, and eye exams.  Stay current with your vaccines.  Tell your health care provider if: ? You often feel depressed. ? You have ever been abused or do not feel safe at home. Summary  Adopting a healthy lifestyle and getting preventive care are important in promoting health and wellness.  Follow your health care provider's instructions about healthy  diet, exercising, and getting tested or screened for diseases.  Follow your health care provider's instructions on monitoring your cholesterol and blood pressure. This information is not intended to replace advice given to you by your health care provider. Make sure you discuss any questions you have with your health care provider. Document Revised: 03/29/2018 Document Reviewed: 03/29/2018 Elsevier Patient Education  2020 [employer]  

## 2019-04-30 NOTE — Assessment & Plan Note (Signed)
Checking lipid panel and adjust pravastatin 40 mg daily.

## 2019-04-30 NOTE — Assessment & Plan Note (Signed)
Flu shot up to date. Pneumonia up to date until 19. Shingrix complete. Tetanus given today. Colonoscopy due 3 years. Mammogram up to date, pap smear up to date with gyn and dexa up to date. Counseled about sun safety and mole surveillance. Counseled about the dangers of distracted driving. Given 10 year screening recommendations.

## 2019-04-30 NOTE — Progress Notes (Signed)
   Subjective:   Patient ID: Margaret [name], female    DOB: 1955-09-22, 64 y.o.   MRN: [id number]  HPI The patient is a 64 YO female coming in for physical.   PMH, [facility], social history reviewed and updated.   Jan colonoscopy 2020, [facility]   Review of Systems  Constitutional: Negative.   HENT: Negative.   Eyes: Negative.   Respiratory: Negative for cough, chest tightness and shortness of breath.   Cardiovascular: Negative for chest pain, palpitations and leg swelling.  Gastrointestinal: Negative for abdominal distention, abdominal pain, constipation, diarrhea, nausea and vomiting.  Musculoskeletal: Negative.   Skin: Negative.   Neurological: Negative.   Psychiatric/Behavioral: Negative.     Objective:  Physical Exam Constitutional:      Appearance: She is well-developed. She is obese.  HENT:     Head: Normocephalic and atraumatic.  Cardiovascular:     Rate and Rhythm: Normal rate and regular rhythm.  Pulmonary:     Effort: Pulmonary effort is normal. No respiratory distress.     Breath sounds: Normal breath sounds. No wheezing or rales.  Abdominal:     General: Bowel sounds are normal. There is no distension.     Palpations: Abdomen is soft.     Tenderness: There is no abdominal tenderness. There is no rebound.  Musculoskeletal:     Cervical back: Normal range of motion.  Skin:    General: Skin is warm and dry.     Comments: Foot exam done  Neurological:     Mental Status: She is alert and oriented to person, place, and time.     Coordination: Coordination normal.     Vitals:   04/30/19 0754  BP: 126/84  Pulse: 100  Temp: 98.3 F (36.8 C)  TempSrc: Oral  SpO2: 96%  Weight: 227 lb (103 kg)  Height: 5\' 4"  (1.626 m)    This visit occurred during the SARS-CoV-2 public health emergency.  Safety protocols were in place, including screening questions prior to the visit, additional usage of staff PPE, and extensive cleaning of exam room while observing appropriate  contact time as indicated for disinfecting solutions.   Assessment & Plan:  Tdap given at visit

## 2019-04-30 NOTE — Assessment & Plan Note (Signed)
BMI 38 but associated with diabetes, hyperlipidemia, and OSA. Counseled and she is exercising more lately.

## 2019-05-01 ENCOUNTER — Encounter: Payer: Self-pay | Admitting: Family Medicine

## 2019-05-01 ENCOUNTER — Ambulatory Visit (INDEPENDENT_AMBULATORY_CARE_PROVIDER_SITE_OTHER): Payer: BC Managed Care – PPO | Admitting: Family Medicine

## 2019-05-01 DIAGNOSIS — S76011A Strain of muscle, fascia and tendon of right hip, initial encounter: Secondary | ICD-10-CM | POA: Diagnosis not present

## 2019-05-01 DIAGNOSIS — M7061 Trochanteric bursitis, right hip: Secondary | ICD-10-CM

## 2019-05-01 NOTE — Progress Notes (Signed)
[place] Phone: [phone] Subjective:   [name], am serving as a scribe for Dr. Hulan Saas. This visit occurred during the SARS-CoV-2 public health emergency.  Safety protocols were in place, including screening questions prior to the visit, additional usage of staff PPE, and extensive cleaning of exam room while observing appropriate contact time as indicated for disinfecting solutions.    CC: Right hip pain follow-up  [id number]   03/20/2019 Repeat injection given today.  Tolerated the procedure well, discussed icing regimen and home exercise, discussed which activities to do which wants to avoid.  Topical anti-inflammatories given.  Discussed in 4 to 8 weeks for further evaluation and treatment.  Update 05/01/2019 Margaret [name] is a 64 y.o. female coming in with complaint of right hip pain. Patient states that her pain increased last week and she thought she might have hurt herself again but pain is better this week. Does walk 13k steps a day. Is not playing tennis  at this time. Stretching has been helping. Injection last visit did help.       Past Medical History:  Diagnosis Date  . Arthritis   . Endometrial cancer ([place])   . Exercise-induced asthma   . Heart murmur   . History of bradycardia 2011   had an episode of HR 38 at time of blood work w/ palpitations and SOB,  work-up done by cardiology (dr dalton Aundra Dubin) stress echo normal on 01/ 2012 and (12/ 2011) holter monitor with PACs with average HR 68/  07-14-2017 per pt no issues since then  . Hx of colonic polyps    x3  . IBS (irritable bowel syndrome)   . OSA (obstructive sleep apnea) 07-14-2017 per had a new study on 07-12-2017 have not heard about results yet   per study 01-06-2005 moderate OSA (AHI 23.3/hr) used cpap, per pt lost weight withthe resolution of symptoms but has gain weight back  . Type 2 diabetes mellitus ([place])   .  Wears glasses    Past Surgical History:  Procedure Laterality Date  . colonoscopy with polypectomy  2012 approx.  . DOBUTAMINE STRESS ECHO  04/2010   normal with no evidence of ischemia or infarction  . LAPAROSCOPIC CHOLECYSTECTOMY  1996  . PLANTAR FASCIA RELEASE    . ROBOTIC ASSISTED SUPRACERVICAL HYSTERECTOMY WITH BILATERAL SALPINGO OOPHERECTOMY N/A 07/19/2017   Procedure: XI ROBOTIC ASSISTED  HYSTERECTOMY WITH BILATERAL SALPINGO OOPHORECTOMY; SENTINEL LYMPH NODE BIOPSY;  Surgeon: Everitt Amber, MD;  Location: [facility];  Service: Gynecology;  Laterality: N/A;  . SENTINEL NODE BIOPSY N/A 07/19/2017   Procedure: SENTINEL NODE BIOPSY;  Surgeon: Everitt Amber, MD;  Location: [facility];  Service: Gynecology;  Laterality: N/A;  . TRANSTHORACIC ECHOCARDIOGRAM  04/17/2010   ef 55%/  trivial AR and TR/ left LAE/     Social History   Socioeconomic History  . Marital status: Divorced    Spouse name: Not on file  . Number of children: Not on file  . Years of education: Not on file  . Highest education level: Not on file  Occupational History  . Occupation: Engineer, manufacturing: [employer]  Tobacco Use  . Smoking status: Former Smoker    Types: Cigarettes  . Smokeless tobacco: Never Used  . Tobacco comment: 07-14-2017 per pt Smoked [age] ONLY as teen  Substance and Sexual Activity  . Alcohol use: Yes    Comment:  rarely  . Drug use: No  . Sexual activity: Not on file  Other Topics Concern  . Not on file  Social History Narrative  . Not on file   Social Determinants of Health   Financial Resource Strain:   . Difficulty of Paying Living Expenses: Not on file  Food Insecurity:   . Worried About Charity fundraiser in the Last Year: Not on file  . Ran Out of Food in the Last Year: Not on file  Transportation Needs:   . Lack of Transportation (Medical): Not on file  . Lack of Transportation (Non-Medical): Not on file  Physical Activity:   . Days of Exercise per Week: Not  on file  . Minutes of Exercise per Session: Not on file  Stress:   . Feeling of Stress : Not on file  Social Connections:   . Frequency of Communication with Friends and Family: Not on file  . Frequency of Social Gatherings with Friends and Family: Not on file  . Attends Religious Services: Not on file  . Active Member of Clubs or Organizations: Not on file  . Attends Archivist Meetings: Not on file  . Marital Status: Not on file   Allergies  Allergen Reactions  . Amoxicillin Hives  . Other Anaphylaxis    TREE NUTS  . Penicillins     Hives Has patient had a PCN reaction causing immediate rash, facial/tongue/throat swelling, SOB or lightheadedness with hypotension: No Has patient had a PCN reaction causing severe rash involving mucus membranes or skin necrosis: No Has patient had a PCN reaction that required hospitalization: No Has patient had a PCN reaction occurring within the last 10 years: No If all of the above answers are "NO", then may proceed with Cephalosporin use.   [name]  [Nitrofurantoin Macrocrystal] Rash  . Paroxetine Other (See Comments)    Headache , malaise   Family History  Problem Relation Age of Onset  . Colon polyps Father   . COPD Father   . Diabetes Father        diet controlled  . Heart attack Mother 40  . COPD Mother   . Lung cancer Mother   . Peripheral vascular disease Mother   . Hypertension Brother        2 bro  . Stroke Neg Hx     Current Outpatient Medications (Endocrine & Metabolic):  .  metFORMIN (GLUCOPHAGE-XR) 500 MG 24 hr tablet, TAKE 2 TABLETS BY MOUTH TWICE A DAY  Current Outpatient Medications (Cardiovascular):  .  lisinopril (ZESTRIL) 2.5 MG tablet, TAKE 1 TABLET BY MOUTH EVERY DAY .  pravastatin (PRAVACHOL) 40 MG tablet, Take 1 tablet (40 mg total) by mouth daily.  Current Outpatient Medications (Respiratory):  .  albuterol (PROVENTIL HFA;VENTOLIN HFA) 108 (90 Base) MCG/ACT inhaler, Inhale 2 puffs into the  lungs every 6 (six) hours as needed for wheezing or shortness of breath.   Current Outpatient Medications (Hematological):  .  vitamin B-12 (CYANOCOBALAMIN) 500 MCG tablet, Take 500 mcg by mouth daily.  Current Outpatient Medications (Other):  Marland Kitchen  Calcium-Mag-Vit C-Vit D 185-28-2.5-200 CAPS,  .  Diclofenac Sodium (PENNSAID) 2 % SOLN, Place 2 application 2 (two) times daily onto the skin.    Past medical history, social, surgical and family history all reviewed in electronic medical record.  No pertanent information unless stated regarding to the chief complaint.   Review of Systems:  No headache, visual changes, nausea, vomiting, diarrhea, constipation, dizziness, abdominal pain,  skin rash, fevers, chills, night sweats, weight loss, swollen lymph nodes, body aches, joint swelling, muscle aches, chest pain, shortness of breath, mood changes.   Objective  Blood pressure 112/80, pulse 88, height 5\' 4"  (1.626 m), weight 225 lb (102.1 kg), SpO2 99 %.    General: No apparent distress alert and oriented x3 mood and affect normal, dressed appropriately.  HEENT: Pupils equal, extraocular movements intact  Respiratory: Patient's speak in full sentences and does not appear short of breath  Cardiovascular: No lower extremity edema, non tender, no erythema  Skin: Warm dry intact with no signs of infection or rash on extremities or on axial skeleton.  Abdomen: Soft nontender  Neuro: Cranial nerves II through XII are intact, neurovascularly intact in all extremities with 2+ DTRs and 2+ pulses.  Lymph: No lymphadenopathy of posterior or anterior cervical chain or axillae bilaterally.  Gait normal with good balance and coordination.  MSK:  Non tender with full range of motion and good stability and symmetric strength and tone of shoulders, elbows, wrist,, knee and ankles bilaterally.  Right hip exam seems to be doing relatively well overall.  Patient does have some tightness still with FABER test.   Minimal tenderness over the greater trochanteric area.  Negative straight leg test.  Minimal discomfort in the piriformis as well as the gluteal tendon at this time.   Impression and Recommendations:     The above documentation has been reviewed and is accurate and complete Lyndal Pulley, DO       Note: This dictation was prepared with Dragon dictation along with smaller phrase technology. Any transcriptional errors that result from this process are unintentional.

## 2019-05-01 NOTE — Assessment & Plan Note (Signed)
Very mild tear with a reactive bursitis that is doing significantly better and minimal pain but still has some tightness.  Discussed topical anti-inflammatories and icing regimen.  As long as patient does well overall patient can follow-up as needed

## 2019-05-01 NOTE — Assessment & Plan Note (Signed)
Nearly completely resolved at this time.

## 2019-05-01 NOTE — Patient Instructions (Signed)
[employer] Total Support Wait until February 1st to play tennis See me in 3 months or when you need me

## 2019-05-03 ENCOUNTER — Encounter: Payer: Self-pay | Admitting: Internal Medicine

## 2019-05-04 ENCOUNTER — Encounter: Payer: Self-pay | Admitting: Internal Medicine

## 2019-05-04 MED ORDER — JARDIANCE 10 MG PO TABS: 10.0000 mg | ORAL_TABLET | Freq: Every day | ORAL | 1 refills | Status: DC

## 2019-05-07 DIAGNOSIS — L738 Other specified follicular disorders: Secondary | ICD-10-CM | POA: Diagnosis not present

## 2019-05-07 DIAGNOSIS — L821 Other seborrheic keratosis: Secondary | ICD-10-CM | POA: Diagnosis not present

## 2019-05-07 DIAGNOSIS — L728 Other follicular cysts of the skin and subcutaneous tissue: Secondary | ICD-10-CM | POA: Diagnosis not present

## 2019-05-07 DIAGNOSIS — L570 Actinic keratosis: Secondary | ICD-10-CM | POA: Diagnosis not present

## 2019-05-07 DIAGNOSIS — D225 Melanocytic nevi of trunk: Secondary | ICD-10-CM | POA: Diagnosis not present

## 2019-07-02 ENCOUNTER — Other Ambulatory Visit: Payer: Self-pay | Admitting: Internal Medicine

## 2019-07-03 ENCOUNTER — Other Ambulatory Visit: Payer: Self-pay | Admitting: Internal Medicine

## 2019-07-03 ENCOUNTER — Encounter: Payer: Self-pay | Admitting: Internal Medicine

## 2019-07-03 MED ORDER — PRAVASTATIN SODIUM 40 MG PO TABS: 40.0000 mg | ORAL_TABLET | Freq: Every day | ORAL | 2 refills | Status: DC

## 2019-07-06 ENCOUNTER — Encounter: Payer: Self-pay | Admitting: Internal Medicine

## 2019-07-25 ENCOUNTER — Encounter: Payer: Self-pay | Admitting: Internal Medicine

## 2019-08-31 ENCOUNTER — Encounter: Payer: Self-pay | Admitting: Gynecologic Oncology

## 2019-09-04 ENCOUNTER — Telehealth: Payer: Self-pay | Admitting: *Deleted

## 2019-09-04 NOTE — Telephone Encounter (Signed)
Called the patient and gave the appt for 6/30 and explained that we are still requiring masks

## 2019-10-01 ENCOUNTER — Other Ambulatory Visit: Payer: Self-pay | Admitting: Internal Medicine

## 2019-10-01 DIAGNOSIS — E1169 Type 2 diabetes mellitus with other specified complication: Secondary | ICD-10-CM

## 2019-10-01 DIAGNOSIS — E669 Obesity, unspecified: Secondary | ICD-10-CM

## 2019-10-08 ENCOUNTER — Encounter: Payer: Self-pay | Admitting: Family Medicine

## 2019-10-17 ENCOUNTER — Other Ambulatory Visit: Payer: Self-pay

## 2019-10-17 ENCOUNTER — Encounter: Payer: Self-pay | Admitting: Gynecologic Oncology

## 2019-10-17 ENCOUNTER — Inpatient Hospital Stay: Payer: BC Managed Care – PPO | Attending: Gynecologic Oncology | Admitting: Gynecologic Oncology

## 2019-10-17 VITALS — BP 113/79 | HR 91 | Temp 99.2°F | Resp 20 | Ht 64.0 in | Wt 209.7 lb

## 2019-10-17 DIAGNOSIS — Z90722 Acquired absence of ovaries, bilateral: Secondary | ICD-10-CM | POA: Insufficient documentation

## 2019-10-17 DIAGNOSIS — Z7984 Long term (current) use of oral hypoglycemic drugs: Secondary | ICD-10-CM | POA: Diagnosis not present

## 2019-10-17 DIAGNOSIS — Z79899 Other long term (current) drug therapy: Secondary | ICD-10-CM | POA: Insufficient documentation

## 2019-10-17 DIAGNOSIS — K589 Irritable bowel syndrome without diarrhea: Secondary | ICD-10-CM | POA: Insufficient documentation

## 2019-10-17 DIAGNOSIS — M1990 Unspecified osteoarthritis, unspecified site: Secondary | ICD-10-CM | POA: Insufficient documentation

## 2019-10-17 DIAGNOSIS — Z90710 Acquired absence of both cervix and uterus: Secondary | ICD-10-CM

## 2019-10-17 DIAGNOSIS — G4733 Obstructive sleep apnea (adult) (pediatric): Secondary | ICD-10-CM | POA: Insufficient documentation

## 2019-10-17 DIAGNOSIS — C541 Malignant neoplasm of endometrium: Secondary | ICD-10-CM

## 2019-10-17 DIAGNOSIS — Z87891 Personal history of nicotine dependence: Secondary | ICD-10-CM | POA: Diagnosis not present

## 2019-10-17 DIAGNOSIS — E119 Type 2 diabetes mellitus without complications: Secondary | ICD-10-CM | POA: Insufficient documentation

## 2019-10-17 DIAGNOSIS — N898 Other specified noninflammatory disorders of vagina: Secondary | ICD-10-CM | POA: Diagnosis not present

## 2019-10-17 NOTE — Patient Instructions (Signed)
Dr Denman George saw and felt a 1cm nodule at the top, right of the vagina. She biopsied this today. The results should be back within a week and she will let you know what they are. If it is scar (benign), nothing needs to be done to treat this. If this is cancer recurrence, she will order scans and then discuss appropriate next steps of treatment.  Presuming negative biopsy results (no cancer), please follow-up with Dr Stann Mainland in 6 months and Dr Denman George in12 months. If cancer is diagnosed, she will see you much sooner and facilitate follow-up.

## 2019-10-17 NOTE — Progress Notes (Signed)
Follow-up Note: Gyn-Onc  Consult was requested by Dr. Stann Mainland for the evaluation of Margaret [name] 64 y.o. female  CC:  Chief Complaint  Patient presents with  . Endometrial cancer ([place])    Follow up    Assessment/Plan:  Margaret [name] is a 63 y.o. with stage IA grade 1 endometrioid endometrial adenocarcinoma (MSI stable/normal).   Pathology revealed low risk factors for recurrence, despite ITC's seen in a [facility], therefore no adjuvant therapy is recommended according to NCCN guidelines. Possible recurrence (right vaginal apex) on exam. Follow-up biopsy result from today. If benign - no further intervention necessary and she will see Dr Stann Mainland in 6 months and myself in 12 months. If malignant - recommend CT chest/abd/pelvis followed by appropriate directed therapy (pelvic and vaginal RT if localized to pelvis, chemo/radiation if disseminated).   HPI: [name] is a 63 y.o. Gravida 1 para 1 last menstrual period at approximately 64 years of age.  She noted an episode of vaginal bleeding approximately 3 weeks ago pelvic ultrasound on May 24, 2017 notable for an endometrium that was thickened and hypervascular uterus measured 9.3 cm in greatest length of the left ovary was within normal limits comment was made regarding the right ovary.  A 1.6 cm fibroid was appreciated.  I do endometrial biopsy was collected and notable for a fragmented specimen with at least endometrial intraepithelial neoplasia and features highly suspicious of endometrioid carcinoma.  She has been on Micronase progesterone since the vaginal bleeding and takes 200 mg daily.  [name] states that the vaginal bleeding has markedly decreased she reports 15 pound weight loss in the last month. Her family history is unremarkable for associated malignancy.  On July 19, 2017 [name] underwent a robotic assisted total hysterectomy BSO and sentinel lymph node biopsy.  Intraoperative findings were unremarkable and  no extrauterine disease was apparent.  Final pathology revealed a 2.5 cm grade 1 endometrioid adenocarcinoma which invaded the myometrium 0.6 cm of 1.2 cm thickness.  There is no cervical stromal involvement no other involvement of the fallopian tubes and ovaries.  Lymphovascular space invasion was not identified.  Evaluation of the sentinel lymph nodes with immunohistochemistry revealed isolated tumor cells involving a single lymph node in the right obturated space.  Immunostains revealed that this was a MSI stable/normal tumor.  The patient's case was presented at tumor board.  Given the otherwise low risk features and her primary uterine tumor, and the uncertain clinical significance of isolated tumor cells, the recommendation was for close observation and no adjuvant therapy in accordance with NCCN guidelines.   Interval hx: She returned for scheduled follow-up on 10/17/19 with no symptoms of recurrent disease.  She reported some mild back pains (low back and central). She denied bleeding.  Review of Systems:  Constitutional  Feels well, Cardiovascular  No chest pain, shortness of breath, or edema  Pulmonary  No cough or wheeze.  Gastro Intestinal  No nausea, vomitting, or diarrhoea. No bright red blood per rectum, no abdominal pain, change in bowel movement, or constipation.  Genito Urinary  No frequency, urgency, dysuria, no vaginal spotting  Musculo Skeletal  No myalgia, arthralgia, joint swelling or pain  Neurologic  No weakness, numbness, change in gait,  Psychology  No depression, anxiety, insomnia.    Current Meds:  Outpatient Encounter Medications as of 10/17/2019  Medication Sig  . albuterol (PROVENTIL HFA;VENTOLIN HFA) 108 (90 Base) MCG/ACT inhaler Inhale 2 puffs into the lungs every 6 (six)  hours as needed for wheezing or shortness of breath.  . Calcium-Mag-Vit C-Vit D 185-28-2.5-200 CAPS   . Diclofenac Sodium (PENNSAID) 2 % SOLN Place 2 application 2 (two) times daily  onto the skin.  Marland Kitchen empagliflozin (JARDIANCE) 10 MG TABS tablet Take 10 mg by mouth daily before breakfast.  . lisinopril (ZESTRIL) 2.5 MG tablet TAKE 1 TABLET BY MOUTH EVERY DAY  . metFORMIN (GLUCOPHAGE-XR) 500 MG 24 hr tablet TAKE 2 TABLETS BY MOUTH TWICE A DAY  . pravastatin (PRAVACHOL) 40 MG tablet Take 1 tablet (40 mg total) by mouth daily.  . vitamin B-12 (CYANOCOBALAMIN) 500 MCG tablet Take 500 mcg by mouth daily.   No facility-administered encounter medications on file as of 10/17/2019.    Allergy:  Allergies  Allergen Reactions  . Amoxicillin Hives  . Other Anaphylaxis    TREE NUTS  . Penicillins     Hives Has patient had a PCN reaction causing immediate rash, facial/tongue/throat swelling, SOB or lightheadedness with hypotension: No Has patient had a PCN reaction causing severe rash involving mucus membranes or skin necrosis: No Has patient had a PCN reaction that required hospitalization: No Has patient had a PCN reaction occurring within the last 10 years: No If all of the above answers are "NO", then may proceed with Cephalosporin use.   [name]  [Nitrofurantoin Macrocrystal] Rash  . Paroxetine Other (See Comments)    Headache , malaise    Social Hx:   Social History   Socioeconomic History  . Marital status: Divorced    Spouse name: Not on file  . Number of children: Not on file  . Years of education: Not on file  . Highest education level: Not on file  Occupational History  . Occupation: Engineer, manufacturing: [employer]  Tobacco Use  . Smoking status: Former Smoker    Types: Cigarettes  . Smokeless tobacco: Never Used  . Tobacco comment: 07-14-2017 per pt Smoked [age] ONLY as teen  Vaping Use  . Vaping Use: Never used  Substance and Sexual Activity  . Alcohol use: Yes    Comment: rarely  . Drug use: No  . Sexual activity: Not on file  Other Topics Concern  . Not on file  Social History Narrative  . Not on file   Social  Determinants of Health   Financial Resource Strain:   . Difficulty of Paying Living Expenses:   Food Insecurity:   . Worried About Charity fundraiser in the Last Year:   . Arboriculturist in the Last Year:   Transportation Needs:   . Film/video editor (Medical):   Marland Kitchen Lack of Transportation (Non-Medical):   Physical Activity:   . Days of Exercise per Week:   . Minutes of Exercise per Session:   Stress:   . Feeling of Stress :   Social Connections:   . Frequency of Communication with Friends and Family:   . Frequency of Social Gatherings with Friends and Family:   . Attends Religious Services:   . Active Member of Clubs or Organizations:   . Attends Archivist Meetings:   Marland Kitchen Marital Status:   Intimate Partner Violence:   . Fear of Current or Ex-Partner:   . Emotionally Abused:   Marland Kitchen Physically Abused:   . Sexually Abused:     Past Surgical Hx:  Past Surgical History:  Procedure Laterality Date  . colonoscopy with polypectomy  2012 approx.  Marland Kitchen  DOBUTAMINE STRESS ECHO  04/2010   normal with no evidence of ischemia or infarction  . LAPAROSCOPIC CHOLECYSTECTOMY  1996  . PLANTAR FASCIA RELEASE    . ROBOTIC ASSISTED SUPRACERVICAL HYSTERECTOMY WITH BILATERAL SALPINGO OOPHERECTOMY N/A 07/19/2017   Procedure: XI ROBOTIC ASSISTED  HYSTERECTOMY WITH BILATERAL SALPINGO OOPHORECTOMY; SENTINEL LYMPH NODE BIOPSY;  Surgeon: Everitt Amber, MD;  Location: [facility];  Service: Gynecology;  Laterality: N/A;  . SENTINEL NODE BIOPSY N/A 07/19/2017   Procedure: SENTINEL NODE BIOPSY;  Surgeon: Everitt Amber, MD;  Location: [facility];  Service: Gynecology;  Laterality: N/A;  . TRANSTHORACIC ECHOCARDIOGRAM  04/17/2010   ef 55%/  trivial AR and TR/ left LAE/      Past Medical Hx:  Past Medical History:  Diagnosis Date  . Arthritis   . Endometrial cancer ([place])   . Exercise-induced asthma   . Heart murmur   . History of bradycardia 2011   had an episode of HR 38 at time of blood work w/  palpitations and SOB,  work-up done by cardiology (dr dalton Aundra Dubin) stress echo normal on 01/ 2012 and (12/ 2011) holter monitor with PACs with average HR 68/  07-14-2017 per pt no issues since then  . Hx of colonic polyps    x3  . IBS (irritable bowel syndrome)   . OSA (obstructive sleep apnea) 07-14-2017 per had a new study on 07-12-2017 have not heard about results yet   per study 01-06-2005 moderate OSA (AHI 23.3/hr) used cpap, per pt lost weight withthe resolution of symptoms but has gain weight back  . Type 2 diabetes mellitus ([place])   . Wears glasses   Last colonoscopy 6 years ago with removal of polyps.  Last mammogram October 2018  Past Gynecological History: Menarche age 25 menses every month.  Reports significant dysmenorrhea until her 65s.  Menopause at age 72.   Reports oral contraceptive pill use for over 20 years.  Denies a history of an abnormal Pap test  family Hx:  Family History  Problem Relation Age of Onset  . Colon polyps Father   . COPD Father   . Diabetes Father        diet controlled  . Heart attack Mother 43  . COPD Mother   . Lung cancer Mother   . Peripheral vascular disease Mother   . Hypertension Brother        2 bro  . Stroke Neg Hx     Vitals:  Blood pressure 113/79, pulse 91, temperature 99.2 F (37.3 C), temperature source Oral, resp. rate 20, height _0  (1.626 m), weight 209 lb 11.2 oz (95.1 kg), SpO2 98 %. Body mass index is 35.99 kg/m.   Physical Exam: WD in NAD Neck  Supple NROM, without any enlargements.  Lymph Node Survey No cervical supraclavicular or inguinal adenopathy Cardiovascular  Pulse normal rate, regularity and rhythm. S1 and S2 normal.  Lungs  Clear to auscultation bilaterally, without wheezes/crackles/rhonchi. Good air movement.  Skin  No rash/lesions/breakdown  Psychiatry  Alert and oriented appropriate mood affect speech and reasoning. Abdomen  Normoactive bowel sounds, abdomen soft, non-tender, without evidence  of hernia. Well healed in cisions.  Back No CVA tenderness Genito Urinary  External genitalia normal Vagina: the right upper vaginal cuff contains a 1cm smooth nodule - biopsied. No paravaginal extension. No other pelvic masses.  Rectal  deferred Extremities  No bilateral cyanosis, clubbing or edema.  Procedure Note:  Preop Dx: vaginal mass Postop Dx: same Procedure: vaginal biopsy Surgeon:  Dorann Ou, MD EBL: <1cc Specimens: right vaginal fornix Complications: none Procedure Details: The patient provided verbal consent and verbal time out performed. The speculum was inserted and the 1cm nodule in the upper right vaginal wall was noted. It was biopsed multiple times with a biopsy forcep. Scant bleeding was resolved with silver nitrate. Sent for permanent pathology. Procedure tolerated well.    Thereasa Solo, MD 10/17/2019, 2:43 PM

## 2019-10-18 ENCOUNTER — Telehealth: Payer: Self-pay

## 2019-10-18 LAB — SURGICAL PATHOLOGY

## 2019-10-18 NOTE — Telephone Encounter (Signed)
LVM for patient on personal vm to call back.  Need to give Bx results.

## 2019-10-19 ENCOUNTER — Telehealth: Payer: Self-pay

## 2019-10-19 NOTE — Telephone Encounter (Signed)
Patient returned call. Informed her of Bx results that are benign, no cancer.  Patient voiced understanding and thanks for call.

## 2019-10-25 DIAGNOSIS — E119 Type 2 diabetes mellitus without complications: Secondary | ICD-10-CM | POA: Diagnosis not present

## 2019-10-25 LAB — HM DIABETES EYE EXAM

## 2019-10-29 ENCOUNTER — Encounter: Payer: Self-pay | Admitting: Gynecologic Oncology

## 2019-11-14 LAB — COMPREHENSIVE METABOLIC PANEL
Albumin: 4.3 (ref 3.5–5.0)
Calcium: 9.3 (ref 8.7–10.7)
GFR calc Af Amer: 84
GFR calc non Af Amer: 73
Globulin: 1.9

## 2019-11-14 LAB — BASIC METABOLIC PANEL
BUN: 27 — AB (ref 4–21)
CO2: 25 — AB (ref 13–22)
Chloride: 104 (ref 99–108)
Creatinine: 0.9 (ref 0.5–1.1)
Glucose: 147
Potassium: 4.6 (ref 3.4–5.3)
Sodium: 141 (ref 137–147)

## 2019-11-14 LAB — LIPID PANEL
Cholesterol: 187 (ref 0–200)
HDL: 67 (ref 35–70)
LDL Cholesterol: 24
Triglycerides: 139 (ref 40–160)

## 2019-11-14 LAB — HEPATIC FUNCTION PANEL
ALT: 23 (ref 7–35)
AST: 22 (ref 13–35)
Alkaline Phosphatase: 83 (ref 25–125)
Bilirubin, Total: 0.4

## 2019-11-14 LAB — HEMOGLOBIN A1C: Hemoglobin A1C: 7

## 2019-11-21 ENCOUNTER — Encounter: Payer: Self-pay | Admitting: Internal Medicine

## 2019-12-07 DIAGNOSIS — Z20828 Contact with and (suspected) exposure to other viral communicable diseases: Secondary | ICD-10-CM | POA: Diagnosis not present

## 2019-12-09 DIAGNOSIS — J029 Acute pharyngitis, unspecified: Secondary | ICD-10-CM | POA: Diagnosis not present

## 2019-12-09 DIAGNOSIS — J329 Chronic sinusitis, unspecified: Secondary | ICD-10-CM | POA: Diagnosis not present

## 2019-12-21 DIAGNOSIS — E118 Type 2 diabetes mellitus with unspecified complications: Secondary | ICD-10-CM | POA: Diagnosis not present

## 2020-01-01 ENCOUNTER — Encounter: Payer: Self-pay | Admitting: Internal Medicine

## 2020-02-01 ENCOUNTER — Encounter: Payer: Self-pay | Admitting: Internal Medicine

## 2020-02-25 DIAGNOSIS — Z01419 Encounter for gynecological examination (general) (routine) without abnormal findings: Secondary | ICD-10-CM | POA: Diagnosis not present

## 2020-02-25 DIAGNOSIS — Z6834 Body mass index (BMI) 34.0-34.9, adult: Secondary | ICD-10-CM | POA: Diagnosis not present

## 2020-03-20 DIAGNOSIS — Z1231 Encounter for screening mammogram for malignant neoplasm of breast: Secondary | ICD-10-CM | POA: Diagnosis not present

## 2020-03-31 ENCOUNTER — Encounter: Payer: Self-pay | Admitting: Internal Medicine

## 2020-03-31 NOTE — Telephone Encounter (Signed)
Med not on med list. Pls advise../lmb 

## 2020-04-01 MED ORDER — EPINEPHRINE 0.3 MG/0.3ML IJ SOAJ: 0.3000 mg | INTRAMUSCULAR | 0 refills | Status: DC | PRN

## 2020-04-07 ENCOUNTER — Other Ambulatory Visit: Payer: Self-pay | Admitting: Internal Medicine

## 2020-04-07 DIAGNOSIS — E1169 Type 2 diabetes mellitus with other specified complication: Secondary | ICD-10-CM

## 2020-04-15 LAB — LIPID PANEL
Cholesterol: 182 (ref 0–200)
HDL: 70 (ref 35–70)
LDL Cholesterol: 93
Triglycerides: 107 (ref 40–160)

## 2020-04-15 LAB — HEMOGLOBIN A1C: Hemoglobin A1C: 6.5

## 2020-04-22 ENCOUNTER — Ambulatory Visit: Payer: BC Managed Care – PPO | Admitting: Internal Medicine

## 2020-04-23 ENCOUNTER — Encounter: Payer: Self-pay | Admitting: Internal Medicine

## 2020-04-29 ENCOUNTER — Other Ambulatory Visit: Payer: Self-pay

## 2020-04-29 ENCOUNTER — Ambulatory Visit: Payer: BC Managed Care – PPO | Admitting: Internal Medicine

## 2020-04-29 ENCOUNTER — Encounter: Payer: Self-pay | Admitting: Internal Medicine

## 2020-04-29 VITALS — BP 120/70 | HR 80 | Temp 98.6°F | Ht 64.0 in | Wt 196.0 lb

## 2020-04-29 DIAGNOSIS — E785 Hyperlipidemia, unspecified: Secondary | ICD-10-CM

## 2020-04-29 DIAGNOSIS — E1169 Type 2 diabetes mellitus with other specified complication: Secondary | ICD-10-CM | POA: Diagnosis not present

## 2020-04-29 DIAGNOSIS — E669 Obesity, unspecified: Secondary | ICD-10-CM | POA: Diagnosis not present

## 2020-04-29 NOTE — Progress Notes (Signed)
   Subjective:   Patient ID: Margaret [name], female    DOB: [birth year], 65 y.o.   MRN: [id number]  HPI The patient is a 65 YO female coming in for follow up diabetes. Working with her health insurance program and they are checking her labs and HgA1c routinely and providing dietary and lifestyle information to her. She is doing well overall and feels good about her health currently. Is doing keto diet but not strict.   Review of Systems  Constitutional: Negative.   HENT: Negative.   Eyes: Negative.   Respiratory: Negative for cough, chest tightness and shortness of breath.   Cardiovascular: Negative for chest pain, palpitations and leg swelling.  Gastrointestinal: Negative for abdominal distention, abdominal pain, constipation, diarrhea, nausea and vomiting.  Musculoskeletal: Negative.   Skin: Negative.   Neurological: Negative.   Psychiatric/Behavioral: Negative.     Objective:  Physical Exam Constitutional:      Appearance: She is well-developed and well-nourished.  HENT:     Head: Normocephalic and atraumatic.  Eyes:     Extraocular Movements: EOM normal.  Cardiovascular:     Rate and Rhythm: Normal rate and regular rhythm.  Pulmonary:     Effort: Pulmonary effort is normal. No respiratory distress.     Breath sounds: Normal breath sounds. No wheezing or rales.  Abdominal:     General: Bowel sounds are normal. There is no distension.     Palpations: Abdomen is soft.     Tenderness: There is no abdominal tenderness. There is no rebound.  Musculoskeletal:        General: No edema.     Cervical back: Normal range of motion.  Skin:    General: Skin is warm and dry.     Comments: Foot exam  Neurological:     Mental Status: She is alert and oriented to person, place, and time.     Coordination: Coordination normal.  Psychiatric:        Mood and Affect: Mood and affect normal.     Vitals:   04/29/20 0936  BP: 120/70  Pulse: 80  Temp: 98.6 F (37 C)  TempSrc: Oral   SpO2: 97%  Weight: 196 lb (88.9 kg)  Height: 5\' 4"  (1.626 m)    This visit occurred during the SARS-CoV-2 public health emergency.  Safety protocols were in place, including screening questions prior to the visit, additional usage of staff PPE, and extensive cleaning of exam room while observing appropriate contact time as indicated for disinfecting solutions.   Assessment & Plan:

## 2020-04-29 NOTE — Patient Instructions (Addendum)
You are doing great with the diabetes so keep up the good work.    Health Maintenance, Female Adopting a healthy lifestyle and getting preventive care are important in promoting health and wellness. Ask your health care provider about:  The right schedule for you to have regular tests and exams.  Things you can do on your own to prevent diseases and keep yourself healthy. What should I know about diet, weight, and exercise? Eat a healthy diet  Eat a diet that includes plenty of vegetables, fruits, low-fat dairy products, and lean protein.  Do not eat a lot of foods that are high in solid fats, added sugars, or sodium.   Maintain a healthy weight Body mass index (BMI) is used to identify weight problems. It estimates body fat based on height and weight. Your health care provider can help determine your BMI and help you achieve or maintain a healthy weight. Get regular exercise Get regular exercise. This is one of the most important things you can do for your health. Most adults should:  Exercise for at least 150 minutes each week. The exercise should increase your heart rate and make you sweat (moderate-intensity exercise).  Do strengthening exercises at least twice a week. This is in addition to the moderate-intensity exercise.  Spend less time sitting. Even light physical activity can be beneficial. Watch cholesterol and blood lipids Have your blood tested for lipids and cholesterol at 63 years of age, then have this test every 5 years. Have your cholesterol levels checked more often if:  Your lipid or cholesterol levels are high.  You are older than 63 years of age.  You are at high risk for heart disease. What should I know about cancer screening? Depending on your health history and family history, you may need to have cancer screening at various ages. This may include screening for:  Breast cancer.  Cervical cancer.  Colorectal cancer.  Skin cancer.  Lung  cancer. What should I know about heart disease, diabetes, and high blood pressure? Blood pressure and heart disease  High blood pressure causes heart disease and increases the risk of stroke. This is more likely to develop in people who have high blood pressure readings, are of African descent, or are overweight.  Have your blood pressure checked: ? Every 3-5 years if you are 38-3 years of age. ? Every year if you are 71 years old or older. Diabetes Have regular diabetes screenings. This checks your fasting blood sugar level. Have the screening done:  Once every three years after age 46 if you are at a normal weight and have a low risk for diabetes.  More often and at a younger age if you are overweight or have a high risk for diabetes. What should I know about preventing infection? Hepatitis B If you have a higher risk for hepatitis B, you should be screened for this virus. Talk with your health care provider to find out if you are at risk for hepatitis B infection. Hepatitis C Testing is recommended for:  Everyone born from [birth year] through 1963.  Anyone with known risk factors for hepatitis C. Sexually transmitted infections (STIs)  Get screened for STIs, including gonorrhea and chlamydia, if: ? You are sexually active and are younger than 63 years of age. ? You are older than 63 years of age and your health care provider tells you that you are at risk for this type of infection. ? Your sexual activity has changed since you were last  screened, and you are at increased risk for chlamydia or gonorrhea. Ask your health care provider if you are at risk.  Ask your health care provider about whether you are at high risk for HIV. Your health care provider may recommend a prescription medicine to help prevent HIV infection. If you choose to take medicine to prevent HIV, you should first get tested for HIV. You should then be tested every 3 months for as long as you are taking the  medicine. Pregnancy  If you are about to stop having your period (premenopausal) and you may become pregnant, seek counseling before you get pregnant.  Take 400 to 800 micrograms (mcg) of folic acid every day if you become pregnant.  Ask for birth control (contraception) if you want to prevent pregnancy. Osteoporosis and menopause Osteoporosis is a disease in which the bones lose minerals and strength with aging. This can result in bone fractures. If you are 74 years old or older, or if you are at risk for osteoporosis and fractures, ask your health care provider if you should:  Be screened for bone loss.  Take a calcium or vitamin D supplement to lower your risk of fractures.  Be given hormone replacement therapy (HRT) to treat symptoms of menopause. Follow these instructions at home: Lifestyle  Do not use any products that contain nicotine or tobacco, such as cigarettes, e-cigarettes, and chewing tobacco. If you need help quitting, ask your health care provider.  Do not use street drugs.  Do not share needles.  Ask your health care provider for help if you need support or information about quitting drugs. Alcohol use  Do not drink alcohol if: ? Your health care provider tells you not to drink. ? You are pregnant, may be pregnant, or are planning to become pregnant.  If you drink alcohol: ? Limit how much you use to 0-1 drink a day. ? Limit intake if you are breastfeeding.  Be aware of how much alcohol is in your drink. In the [place] one drink equals one 12 oz bottle of beer (355 mL), one 5 oz glass of wine (148 mL), or one 1 oz glass of hard liquor (44 mL). General instructions  Schedule regular health, dental, and eye exams.  Stay current with your vaccines.  Tell your health care provider if: ? You often feel depressed. ? You have ever been abused or do not feel safe at home. Summary  Adopting a healthy lifestyle and getting preventive care are important in  promoting health and wellness.  Follow your health care provider's instructions about healthy diet, exercising, and getting tested or screened for diseases.  Follow your health care provider's instructions on monitoring your cholesterol and blood pressure. This information is not intended to replace advice given to you by your health care provider. Make sure you discuss any questions you have with your health care provider. Document Revised: 03/29/2018 Document Reviewed: 03/29/2018 Elsevier Patient Education  2021 [employer].

## 2020-05-02 NOTE — Assessment & Plan Note (Signed)
Recent lipid panel at goal on pravastatin 40 mg daily.

## 2020-05-02 NOTE — Assessment & Plan Note (Signed)
Recent HgA1c at goal on her metformin. She is on ACE-I and statin. Foot exam done.

## 2020-05-05 ENCOUNTER — Encounter: Payer: Self-pay | Admitting: Internal Medicine

## 2020-05-06 ENCOUNTER — Other Ambulatory Visit: Payer: Self-pay

## 2020-05-06 ENCOUNTER — Other Ambulatory Visit: Payer: Self-pay | Admitting: Internal Medicine

## 2020-05-06 ENCOUNTER — Telehealth (INDEPENDENT_AMBULATORY_CARE_PROVIDER_SITE_OTHER): Payer: BC Managed Care – PPO | Admitting: Internal Medicine

## 2020-05-06 ENCOUNTER — Encounter: Payer: Self-pay | Admitting: Internal Medicine

## 2020-05-06 DIAGNOSIS — E1169 Type 2 diabetes mellitus with other specified complication: Secondary | ICD-10-CM

## 2020-05-06 DIAGNOSIS — J029 Acute pharyngitis, unspecified: Secondary | ICD-10-CM | POA: Insufficient documentation

## 2020-05-06 DIAGNOSIS — E669 Obesity, unspecified: Secondary | ICD-10-CM

## 2020-05-06 MED ORDER — AZITHROMYCIN 250 MG PO TABS: ORAL_TABLET | ORAL | 0 refills | Status: DC

## 2020-05-06 NOTE — Assessment & Plan Note (Signed)
Will cover for strep with z-pack given pcn allergy.

## 2020-05-06 NOTE — Progress Notes (Signed)
Virtual Visit via Audio Note  I connected with Margaret [name] on 05/06/20 at  2:20 PM EST by an audio-only enabled telemedicine application and verified that I am speaking with the correct person using two identifiers.  The patient and the provider were at separate locations throughout the entire encounter. Patient location: home, Provider location: work   I discussed the limitations of evaluation and management by telemedicine and the availability of in person appointments. The patient expressed understanding and agreed to proceed. The patient and the provider were the only parties present for the visit unless noted in HPI below.  History of Present Illness: The patient is a 65 y.o. female with visit for sore throat and chills. Started Sunday and did covid-19 test times 2 which were negative. Has fever to max 99.4 F on tylenol. Denies sick contacts or known covid-19 expsoure. Overall it is not improving. Has tried tylenol otc without relief. Denies cough. Not sure about neck swelling.  Observations/Objective: A and O times 3  Assessment and Plan: See problem oriented charting  Follow Up Instructions: rx azithromycin  Visit time 7 minutes in face to face communication with patient and coordination of care.  I discussed the assessment and treatment plan with the patient. The patient was provided an opportunity to ask questions and all were answered. The patient agreed with the plan and demonstrated an understanding of the instructions.   The patient was advised to call back or seek an in-person evaluation if the symptoms worsen or if the condition fails to improve as anticipated.  Hoyt Koch, MD

## 2020-05-07 DIAGNOSIS — Z1152 Encounter for screening for COVID-19: Secondary | ICD-10-CM | POA: Diagnosis not present

## 2020-05-08 ENCOUNTER — Ambulatory Visit: Payer: BC Managed Care – PPO | Admitting: Family Medicine

## 2020-05-08 ENCOUNTER — Encounter: Payer: Self-pay | Admitting: Internal Medicine

## 2020-05-09 ENCOUNTER — Encounter: Payer: Self-pay | Admitting: Internal Medicine

## 2020-05-25 ENCOUNTER — Other Ambulatory Visit: Payer: Self-pay | Admitting: Internal Medicine

## 2020-06-05 DIAGNOSIS — L218 Other seborrheic dermatitis: Secondary | ICD-10-CM | POA: Diagnosis not present

## 2020-06-05 DIAGNOSIS — D229 Melanocytic nevi, unspecified: Secondary | ICD-10-CM | POA: Diagnosis not present

## 2020-06-05 DIAGNOSIS — L821 Other seborrheic keratosis: Secondary | ICD-10-CM | POA: Diagnosis not present

## 2020-06-05 DIAGNOSIS — L814 Other melanin hyperpigmentation: Secondary | ICD-10-CM | POA: Diagnosis not present

## 2020-06-11 ENCOUNTER — Encounter: Payer: Self-pay | Admitting: Internal Medicine

## 2020-06-15 ENCOUNTER — Other Ambulatory Visit: Payer: Self-pay | Admitting: Internal Medicine

## 2020-06-15 ENCOUNTER — Encounter: Payer: Self-pay | Admitting: Internal Medicine

## 2020-06-16 ENCOUNTER — Other Ambulatory Visit: Payer: Self-pay

## 2020-06-16 DIAGNOSIS — E1169 Type 2 diabetes mellitus with other specified complication: Secondary | ICD-10-CM

## 2020-06-16 DIAGNOSIS — E669 Obesity, unspecified: Secondary | ICD-10-CM

## 2020-06-16 MED ORDER — LISINOPRIL 2.5 MG PO TABS: 2.5000 mg | ORAL_TABLET | Freq: Every day | ORAL | 1 refills | Status: DC

## 2020-06-17 ENCOUNTER — Other Ambulatory Visit: Payer: Self-pay

## 2020-06-17 DIAGNOSIS — E1169 Type 2 diabetes mellitus with other specified complication: Secondary | ICD-10-CM

## 2020-06-17 DIAGNOSIS — E669 Obesity, unspecified: Secondary | ICD-10-CM

## 2020-06-17 MED ORDER — LISINOPRIL 2.5 MG PO TABS: 2.5000 mg | ORAL_TABLET | Freq: Every day | ORAL | 1 refills | Status: DC

## 2020-07-03 ENCOUNTER — Other Ambulatory Visit: Payer: Self-pay | Admitting: Internal Medicine

## 2020-07-04 ENCOUNTER — Encounter: Payer: Self-pay | Admitting: Internal Medicine

## 2020-07-04 ENCOUNTER — Other Ambulatory Visit: Payer: Self-pay

## 2020-07-04 MED ORDER — METFORMIN HCL ER 500 MG PO TB24: 1000.0000 mg | ORAL_TABLET | Freq: Two times a day (BID) | ORAL | 2 refills | Status: DC

## 2020-07-25 ENCOUNTER — Encounter: Payer: Self-pay | Admitting: Internal Medicine

## 2020-08-25 ENCOUNTER — Encounter: Payer: Self-pay | Admitting: Gynecologic Oncology

## 2020-08-25 ENCOUNTER — Telehealth: Payer: Self-pay | Admitting: *Deleted

## 2020-08-25 NOTE — Telephone Encounter (Signed)
Called and scheduled the patient for a follow up appt  

## 2020-09-06 ENCOUNTER — Other Ambulatory Visit: Payer: Self-pay | Admitting: Internal Medicine

## 2020-09-06 DIAGNOSIS — E1169 Type 2 diabetes mellitus with other specified complication: Secondary | ICD-10-CM

## 2020-09-18 ENCOUNTER — Encounter: Payer: Self-pay | Admitting: Gynecologic Oncology

## 2020-09-19 ENCOUNTER — Other Ambulatory Visit: Payer: Self-pay

## 2020-09-19 ENCOUNTER — Inpatient Hospital Stay: Payer: BC Managed Care – PPO | Attending: Gynecologic Oncology | Admitting: Gynecologic Oncology

## 2020-09-19 ENCOUNTER — Encounter: Payer: Self-pay | Admitting: Gynecologic Oncology

## 2020-09-19 VITALS — BP 116/78 | HR 88 | Temp 97.8°F | Resp 16 | Ht 64.0 in | Wt 196.8 lb

## 2020-09-19 DIAGNOSIS — Z7984 Long term (current) use of oral hypoglycemic drugs: Secondary | ICD-10-CM | POA: Diagnosis not present

## 2020-09-19 DIAGNOSIS — Z79899 Other long term (current) drug therapy: Secondary | ICD-10-CM | POA: Diagnosis not present

## 2020-09-19 DIAGNOSIS — R011 Cardiac murmur, unspecified: Secondary | ICD-10-CM | POA: Insufficient documentation

## 2020-09-19 DIAGNOSIS — Z87891 Personal history of nicotine dependence: Secondary | ICD-10-CM | POA: Diagnosis not present

## 2020-09-19 DIAGNOSIS — C541 Malignant neoplasm of endometrium: Secondary | ICD-10-CM | POA: Diagnosis not present

## 2020-09-19 DIAGNOSIS — E119 Type 2 diabetes mellitus without complications: Secondary | ICD-10-CM | POA: Insufficient documentation

## 2020-09-19 DIAGNOSIS — Z90722 Acquired absence of ovaries, bilateral: Secondary | ICD-10-CM | POA: Diagnosis not present

## 2020-09-19 DIAGNOSIS — Z90710 Acquired absence of both cervix and uterus: Secondary | ICD-10-CM | POA: Diagnosis not present

## 2020-09-19 NOTE — Patient Instructions (Signed)
Please notify Dr Denman George at phone number [phone] if you notice vaginal bleeding, new pelvic or abdominal pains, bloating, feeling full easy, or a change in bladder or bowel function.   Please contact Dr Serita Grit office (at [phone]) in the fall after your appointment with Dr Stann Mainland to request an appointment with her for June, 2023.

## 2020-09-19 NOTE — Progress Notes (Signed)
Follow-up Note: Gyn-Onc  Consult was requested by Dr. Stann Mainland for the evaluation of Margaret [name] 65 y.o. female  CC:  Chief Complaint  Patient presents with  . Endometrial cancer [facility])    Assessment/Plan:  [name] is a 65 y.o. with stage IA grade 1 endometrioid endometrial adenocarcinoma (MSI stable/normal).   Pathology revealed low risk factors for recurrence, despite ITC's seen in a SLN, therefore no adjuvant therapy is recommended according to NCCN guidelines.  No evidence for recurrence on today's exam.  Recommend follow-up with Dr Stann Mainland in 6 months and myself in 12 months.  Survivorship care plan given.   HPI: [name] is a 65 y.o. Gravida 1 para 1 last menstrual period at approximately 65 years of age.  She noted an episode of vaginal bleeding approximately 3 weeks ago pelvic ultrasound on May 24, 2017 notable for an endometrium that was thickened and hypervascular uterus measured 9.3 cm in greatest length of the left ovary was within normal limits comment was made regarding the right ovary.  A 1.6 cm fibroid was appreciated.  I do endometrial biopsy was collected and notable for a fragmented specimen with at least endometrial intraepithelial neoplasia and features highly suspicious of endometrioid carcinoma.  She has been on Micronase progesterone since the vaginal bleeding and takes 200 mg daily.  Margaret [name] states that the vaginal bleeding has markedly decreased she reports 15 pound weight loss in the last month. Her family history is unremarkable for associated malignancy.  On July 19, 2017 Margaret [name] underwent a robotic assisted total hysterectomy BSO and sentinel lymph node biopsy.  Intraoperative findings were unremarkable and no extrauterine disease was apparent.  Final pathology revealed a 2.5 cm grade 1 endometrioid adenocarcinoma which invaded the myometrium 0.6 cm of 1.2 cm thickness.  There is no cervical stromal involvement no other  involvement of the fallopian tubes and ovaries.  Lymphovascular space invasion was not identified.  Evaluation of the sentinel lymph nodes with immunohistochemistry revealed isolated tumor cells involving a single lymph node in the right obturated space.  Immunostains revealed that this was a MSI stable/normal tumor.  The patient's case was presented at tumor board.  Given the otherwise low risk features and her primary uterine tumor, and the uncertain clinical significance of isolated tumor cells, the recommendation was for close observation and no adjuvant therapy in accordance with NCCN guidelines.  She underwent a vaginal biopsy on 10/17/2019 at the time of her routine scheduled surveillance visit when some nodularity or thickening was appreciated the right vaginal apex.  This biopsy was benign.  She reported no vaginal bleeding or other symptoms concerning for recurrence and did well after this biopsy.   Interval hx: She returned for scheduled follow-up with no symptoms of recurrence.  Review of Systems:  Constitutional  Feels well, Cardiovascular  No chest pain, shortness of breath, or edema  Pulmonary  No cough or wheeze.  Gastro Intestinal  No nausea, vomitting, or diarrhoea. No bright red blood per rectum, no abdominal pain, change in bowel movement, or constipation.  Genito Urinary  No frequency, urgency, dysuria, no vaginal spotting  Musculo Skeletal  No myalgia, arthralgia, joint swelling or pain  Neurologic  No weakness, numbness, change in gait,  Psychology  No depression, anxiety, insomnia.    Current Meds:  Outpatient Encounter Medications as of 09/19/2020  Medication Sig  . lisinopril (ZESTRIL) 2.5 MG tablet TAKE 1 TABLET BY MOUTH EVERY DAY  . Melatonin 5 MG  CHEW Chew 3 each by mouth as needed.  . metFORMIN (GLUCOPHAGE-XR) 500 MG 24 hr tablet TAKE 2 TABLETS BY MOUTH TWICE A DAY  . Omega-3 Fatty Acids (FISH OIL ADULT GUMMIES PO) Take by mouth daily.  . pravastatin  (PRAVACHOL) 40 MG tablet TAKE 1 TABLET BY MOUTH EVERY DAY  . vitamin B-12 (CYANOCOBALAMIN) 500 MCG tablet Take 500 mcg by mouth daily.  Marland Kitchen azithromycin (ZITHROMAX) 250 MG tablet Day 1 take 2 pills, days 2-5 take 1 pill daily (Patient not taking: Reported on 09/18/2020)  . BIOTIN PO Take by mouth daily. (Patient not taking: Reported on 09/18/2020)  . EPINEPHrine 0.3 mg/0.3 mL IJ SOAJ injection Inject 0.3 mg into the muscle as needed for anaphylaxis. (Patient not taking: Reported on 09/18/2020)   No facility-administered encounter medications on file as of 09/19/2020.    Allergy:  Allergies  Allergen Reactions  . Amoxicillin Hives  . Other Anaphylaxis    TREE NUTS  . Penicillins Hives and Rash    Hives Has patient had a PCN reaction causing immediate rash, facial/tongue/throat swelling, SOB or lightheadedness with hypotension: No Has patient had a PCN reaction causing severe rash involving mucus membranes or skin necrosis: No Has patient had a PCN reaction that required hospitalization: No Has patient had a PCN reaction occurring within the last 10 years: No If all of the above answers are "NO", then may proceed with Cephalosporin use.  Hives Has patient had a PCN reaction causing immediate rash, facial/tongue/throat swelling, SOB or lightheadedness with hypotension: No Has patient had a PCN reaction causing severe rash involving mucus membranes or skin necrosis: No Has patient had a PCN reaction that required hospitalization: No Has patient had a PCN reaction occurring within the last 10 years: No If all of the above answers are "NO", then may proceed with Cephalosporin use. Hives Has patient had a PCN reaction causing immediate rash, facial/tongue/throat swelling, SOB or lightheadedness with hypotension: No Has patient had a PCN reaction causing severe rash involving mucus membranes or skin necrosis: No Has patient had a PCN reaction that required hospitalization: No Has patient had a PCN  reaction occurring within the last 10 years: No If all of the above answers are "NO", then may proceed with Cephalosporin use.  [name]  [Nitrofurantoin Macrocrystal] Rash  . Paroxetine Other (See Comments)    Headache , malaise    Social Hx:   Social History   Socioeconomic History  . Marital status: Divorced    Spouse name: Not on file  . Number of children: Not on file  . Years of education: Not on file  . Highest education level: Not on file  Occupational History  . Occupation: Engineer, manufacturing: [employer]  Tobacco Use  . Smoking status: Former Smoker    Types: Cigarettes  . Smokeless tobacco: Never Used  . Tobacco comment: 07-14-2017 per pt Smoked [age] ONLY as teen  Vaping Use  . Vaping Use: Never used  Substance and Sexual Activity  . Alcohol use: Yes    Comment: rarely  . Drug use: No  . Sexual activity: Not on file  Other Topics Concern  . Not on file  Social History Narrative  . Not on file   Social Determinants of Health   Financial Resource Strain: Not on file  Food Insecurity: Not on file  Transportation Needs: Not on file  Physical Activity: Not on file  Stress: Not on file  Social  Connections: Not on file  Intimate Partner Violence: Not on file    Past Surgical Hx:  Past Surgical History:  Procedure Laterality Date  . colonoscopy with polypectomy  2012 approx.  . DOBUTAMINE STRESS ECHO  04/2010   normal with no evidence of ischemia or infarction  . LAPAROSCOPIC CHOLECYSTECTOMY  1996  . PLANTAR FASCIA RELEASE    . ROBOTIC ASSISTED SUPRACERVICAL HYSTERECTOMY WITH BILATERAL SALPINGO OOPHERECTOMY N/A 07/19/2017   Procedure: XI ROBOTIC ASSISTED  HYSTERECTOMY WITH BILATERAL SALPINGO OOPHORECTOMY; SENTINEL LYMPH NODE BIOPSY;  Surgeon: Everitt Amber, MD;  Location: [facility];  Service: Gynecology;  Laterality: N/A;  . SENTINEL NODE BIOPSY N/A 07/19/2017   Procedure: SENTINEL NODE BIOPSY;  Surgeon: Everitt Amber, MD;  Location: [facility]  [facility];  Service: Gynecology;  Laterality: N/A;  . TRANSTHORACIC ECHOCARDIOGRAM  04/17/2010   ef 55%/  trivial AR and TR/ left LAE/      Past Medical Hx:  Past Medical History:  Diagnosis Date  . Arthritis   . Endometrial cancer ([place])   . Exercise-induced asthma   . Heart murmur   . History of bradycardia 2011   had an episode of HR 38 at time of blood work w/ palpitations and SOB,  work-up done by cardiology (dr dalton Aundra Dubin) stress echo normal on 01/ 2012 and (12/ 2011) holter monitor with PACs with average HR 68/  07-14-2017 per pt no issues since then  . Hx of colonic polyps    x3  . IBS (irritable bowel syndrome)   . OSA (obstructive sleep apnea) 07-14-2017 per had a new study on 07-12-2017 have not heard about results yet   per study 01-06-2005 moderate OSA (AHI 23.3/hr) used cpap, per pt lost weight withthe resolution of symptoms but has gain weight back  . Type 2 diabetes mellitus ([place])   . Wears glasses   Last colonoscopy 6 years ago with removal of polyps.  Last mammogram October 2018  Past Gynecological History: Menarche age 73 menses every month.  Reports significant dysmenorrhea until her 59s.  Menopause at age 89.   Reports oral contraceptive pill use for over 20 years.  Denies a history of an abnormal Pap test  family Hx:  Family History  Problem Relation Age of Onset  . Colon polyps Father   . COPD Father   . Diabetes Father        diet controlled  . Heart attack Mother 79  . COPD Mother   . Lung cancer Mother   . Peripheral vascular disease Mother   . Hypertension Brother        2 bro  . Stroke Neg Hx     Vitals:  Blood pressure 116/78, pulse 88, temperature 97.8 F (36.6 C), temperature source Tympanic, resp. rate 16, height $RemoveBe'5\' 4"'mnBVmOmvy$  (1.626 m), weight 196 lb 12.8 oz (89.3 kg), SpO2 98 %. Body mass index is 33.78 kg/m.   Physical Exam: WD in NAD Neck  Supple NROM, without any enlargements.  Lymph Node Survey No cervical supraclavicular or inguinal  adenopathy Cardiovascular  Well perfused peripheries Lungs  No increased WOB Skin  No rash/lesions/breakdown  Psychiatry  Alert and oriented appropriate mood affect speech and reasoning. Abdomen  Normoactive bowel sounds, abdomen soft, non-tender, without evidence of hernia. Well healed incisions.  Back No CVA tenderness Genito Urinary  External genitalia normal Vagina: the right upper vaginal cuff contains a 1cm smooth nodule - biopsied. No paravaginal extension. No other pelvic masses.  Rectal  deferred Extremities  No bilateral  cyanosis, clubbing or edema.  Thereasa Solo, MD 09/19/2020, 3:07 PM

## 2020-09-22 ENCOUNTER — Encounter: Payer: Self-pay | Admitting: Internal Medicine

## 2020-09-23 DIAGNOSIS — R208 Other disturbances of skin sensation: Secondary | ICD-10-CM | POA: Diagnosis not present

## 2020-09-23 DIAGNOSIS — R197 Diarrhea, unspecified: Secondary | ICD-10-CM | POA: Diagnosis not present

## 2020-09-24 DIAGNOSIS — R197 Diarrhea, unspecified: Secondary | ICD-10-CM | POA: Diagnosis not present

## 2020-10-06 ENCOUNTER — Encounter: Payer: Self-pay | Admitting: Internal Medicine

## 2020-10-06 NOTE — Telephone Encounter (Signed)
Called [facility] to verify pt had refills for lisinopril, they confirmed that there was a refill on file. Sent pt a mychart message to advise her.

## 2020-10-19 DIAGNOSIS — E118 Type 2 diabetes mellitus with unspecified complications: Secondary | ICD-10-CM | POA: Diagnosis not present

## 2020-10-25 DIAGNOSIS — E119 Type 2 diabetes mellitus without complications: Secondary | ICD-10-CM | POA: Diagnosis not present

## 2020-11-18 ENCOUNTER — Encounter: Payer: Self-pay | Admitting: Internal Medicine

## 2020-11-20 ENCOUNTER — Ambulatory Visit: Payer: Self-pay

## 2020-11-20 ENCOUNTER — Ambulatory Visit (INDEPENDENT_AMBULATORY_CARE_PROVIDER_SITE_OTHER): Payer: BC Managed Care – PPO

## 2020-11-20 ENCOUNTER — Other Ambulatory Visit: Payer: Self-pay

## 2020-11-20 ENCOUNTER — Encounter: Payer: Self-pay | Admitting: Family Medicine

## 2020-11-20 ENCOUNTER — Ambulatory Visit (INDEPENDENT_AMBULATORY_CARE_PROVIDER_SITE_OTHER): Payer: BC Managed Care – PPO | Admitting: Family Medicine

## 2020-11-20 VITALS — BP 104/72 | HR 87 | Ht 64.0 in | Wt 198.0 lb

## 2020-11-20 DIAGNOSIS — M7061 Trochanteric bursitis, right hip: Secondary | ICD-10-CM | POA: Diagnosis not present

## 2020-11-20 DIAGNOSIS — M25551 Pain in right hip: Secondary | ICD-10-CM | POA: Diagnosis not present

## 2020-11-20 NOTE — Assessment & Plan Note (Signed)
Patient responded well.  Given injection today.  Had improvement almost immediately.  Discussed which activities to do which wants to avoid.  Increase activity slowly.  Patient declined formal physical therapy today.  We will consider this if this continues.  We also discussed the potential for other imaging.  Follow-up again in 6 to 8 weeks.

## 2020-11-20 NOTE — Patient Instructions (Addendum)
Xray R hip Exercises Pennsaid 2x a day fingertip sized amount See me again in 6-8 weeks

## 2020-11-20 NOTE — Progress Notes (Signed)
Margaret [name] Phone: [phone] Subjective:   Margaret [name], am serving as a scribe for Dr. Hulan Saas. This visit occurred during the SARS-CoV-2 public health emergency.  Safety protocols were in place, including screening questions prior to the visit, additional usage of staff PPE, and extensive cleaning of exam room while observing appropriate contact time as indicated for disinfecting solutions.   I'm seeing this patient by the request  of:  Margaret Koch, MD  CC: Hip pain follow-up  [id number]  Margaret [name] is a 65 y.o. female coming in with complaint of hip pain.  Patient has been seen previously for greater trochanteric bursitis as well as a gluteal tendon tear.  Patient did respond well to injection and last one was in December 2020.  Patient states that she has had pain for 1 week over R greater trochanter. Patient was sitting in a chair and feels that she may have torn something while sitting down. Has been icing and using IBU. Patient states that she almost came in last year as she had the same pain but then pain went away.   Imaging review today includes patient'sPET scan from 2019 that showed nothing specific.  Did have very mild hypermetabolic activity of the left vaginal cuff but otherwise unremarkable for any musculoskeletal difficulty.  Past Medical History:  Diagnosis Date   Arthritis    Endometrial cancer ([place])    Exercise-induced asthma    Heart murmur    History of bradycardia 2011   had an episode of HR 38 at time of blood work w/ palpitations and SOB,  work-up done by cardiology (dr dalton Aundra Dubin) stress echo normal on 01/ 2012 and (12/ 2011) holter monitor with PACs with average HR 68/  07-14-2017 per pt [name] issues since then   Hx of colonic polyps    x3   IBS (irritable bowel syndrome)    OSA (obstructive sleep apnea) 07-14-2017 per had a new study on 07-12-2017 have not  heard about results yet   per study 01-06-2005 moderate OSA (AHI 23.3/hr) used cpap, per pt lost weight withthe resolution of symptoms but has gain weight back   Type 2 diabetes mellitus ([place])    Wears glasses    Past Surgical History:  Procedure Laterality Date   colonoscopy with polypectomy  2012 approx.   DOBUTAMINE STRESS ECHO  04/2010   normal with [name] evidence of ischemia or infarction   LAPAROSCOPIC CHOLECYSTECTOMY  1996   PLANTAR FASCIA RELEASE     ROBOTIC ASSISTED SUPRACERVICAL HYSTERECTOMY WITH BILATERAL SALPINGO OOPHERECTOMY N/A 07/19/2017   Procedure: XI ROBOTIC ASSISTED  HYSTERECTOMY WITH BILATERAL SALPINGO OOPHORECTOMY; SENTINEL LYMPH NODE BIOPSY;  Surgeon: Everitt Amber, MD;  Location: [facility];  Service: Gynecology;  Laterality: N/A;   SENTINEL NODE BIOPSY N/A 07/19/2017   Procedure: SENTINEL NODE BIOPSY;  Surgeon: Everitt Amber, MD;  Location: [facility];  Service: Gynecology;  Laterality: N/A;   TRANSTHORACIC ECHOCARDIOGRAM  04/17/2010   ef 55%/  trivial AR and TR/ left LAE/     Social History   Socioeconomic History   Marital status: Divorced    Spouse name: Not on file   Number of children: Not on file   Years of education: Not on file   Highest education level: Not on file  Occupational History   Occupation: Administrator    Employer: [employer]  Tobacco Use   Smoking status: Former  Types: Cigarettes   Smokeless tobacco: Never   Tobacco comments:    07-14-2017 per pt Smoked [age] ONLY as teen  Vaping Use   Vaping Use: Never used  Substance and Sexual Activity   Alcohol use: Yes    Comment: rarely   Drug use: [name]   Sexual activity: Not on file  Other Topics Concern   Not on file  Social History Narrative   Not on file   Social Determinants of Health   Financial Resource Strain: Not on file  Food Insecurity: Not on file  Transportation Needs: Not on file  Physical Activity: Not on file  Stress: Not on file  Social Connections: Not on file    Allergies  Allergen Reactions   Amoxicillin Hives   Other Anaphylaxis    TREE NUTS   Penicillins Hives and Rash    Hives Has patient had a PCN reaction causing immediate rash, facial/tongue/throat swelling, SOB or lightheadedness with hypotension: [name] Has patient had a PCN reaction causing severe rash involving mucus membranes or skin necrosis: [name] Has patient had a PCN reaction that required hospitalization: [name] Has patient had a PCN reaction occurring within the last 10 years: [name] If all of the above answers are "[name]", then may proceed with Cephalosporin use.  Hives Has patient had a PCN reaction causing immediate rash, facial/tongue/throat swelling, SOB or lightheadedness with hypotension: [name] Has patient had a PCN reaction causing severe rash involving mucus membranes or skin necrosis: [name] Has patient had a PCN reaction that required hospitalization: [name] Has patient had a PCN reaction occurring within the last 10 years: [name] If all of the above answers are "[name]", then may proceed with Cephalosporin use. Hives Has patient had a PCN reaction causing immediate rash, facial/tongue/throat swelling, SOB or lightheadedness with hypotension: [name] Has patient had a PCN reaction causing severe rash involving mucus membranes or skin necrosis: [name] Has patient had a PCN reaction that required hospitalization: [name] Has patient had a PCN reaction occurring within the last 10 years: [name] If all of the above answers are "[name]", then may proceed with Cephalosporin use.   Macrodantin  [Nitrofurantoin Macrocrystal] Rash   Paroxetine Other (See Comments)    Headache , malaise   Family History  Problem Relation Age of Onset   Colon polyps Father    COPD Father    Diabetes Father        diet controlled   Heart attack Mother 56   COPD Mother    Lung cancer Mother    Peripheral vascular disease Mother    Hypertension Brother        2 bro   Stroke Neg Hx     Current Outpatient Medications (Endocrine &  Metabolic):    metFORMIN (GLUCOPHAGE-XR) 500 MG 24 hr tablet, TAKE 2 TABLETS BY MOUTH TWICE A DAY  Current Outpatient Medications (Cardiovascular):    EPINEPHrine 0.3 mg/0.3 mL IJ SOAJ injection, Inject 0.3 mg into the muscle as needed for anaphylaxis. (Patient not taking: Reported on 09/18/2020)   lisinopril (ZESTRIL) 2.5 MG tablet, TAKE 1 TABLET BY MOUTH EVERY DAY   pravastatin (PRAVACHOL) 40 MG tablet, TAKE 1 TABLET BY MOUTH EVERY DAY    Current Outpatient Medications (Hematological):    vitamin B-12 (CYANOCOBALAMIN) 500 MCG tablet, Take 500 mcg by mouth daily.  Current Outpatient Medications (Other):    azithromycin (ZITHROMAX) 250 MG tablet, Day 1 take 2 pills, days 2-5 take 1 pill daily (Patient not taking: Reported on 09/18/2020)   BIOTIN  PO, Take by mouth daily. (Patient not taking: Reported on 09/18/2020)   Melatonin 5 MG CHEW, Chew 3 each by mouth as needed.   Omega-3 Fatty Acids (FISH OIL ADULT GUMMIES PO), Take by mouth daily.   Reviewed prior external information including notes and imaging from  primary care provider As well as notes that were available from care everywhere and other healthcare systems.  Past medical history, social, surgical and family history all reviewed in electronic medical record.  [name] pertanent information unless stated regarding to the chief complaint.   Review of Systems:  [name] headache, visual changes, nausea, vomiting, diarrhea, constipation, dizziness, abdominal pain, skin rash, fevers, chills, night sweats, weight loss, swollen lymph nodes, body aches, joint swelling, chest pain, shortness of breath, mood changes. POSITIVE muscle aches  Objective  There were [name] vitals taken for this visit.   General: [name] apparent distress alert and oriented x3 mood and affect normal, dressed appropriately.  HEENT: Pupils equal, extraocular movements intact  Respiratory: Patient's speak in full sentences and does not appear short of breath  Cardiovascular: [name] lower  extremity edema, non tender, [name] erythema  Gait normal with good balance and coordination.  MSK: Hip exam shows patient is severely tender to palpation of the greater trochanteric area.  The patient does have tightness with FABER test as well.  Negative straight leg test.  Weakness of the core strength noted.   Procedure: Real-time Ultrasound Guided Injection of right greater trochanteric bursitis secondary to patient's body habitus Device: GE Logiq Q7 Ultrasound guided injection is preferred based studies that show increased duration, increased effect, greater accuracy, decreased procedural pain, increased response rate, and decreased cost with ultrasound guided versus blind injection.  Verbal informed consent obtained.  Time-out conducted.  Noted [name] overlying erythema, induration, or other signs of local infection.  Skin prepped in a sterile fashion.  Local anesthesia: Topical Ethyl chloride.  With sterile technique and under real time ultrasound guidance:  Greater trochanteric area was visualized and patient's bursa was noted. A 22-gauge 3 inch needle was inserted and 4 cc of 0.5% Marcaine and 1 cc of Kenalog 40 mg/dL was injected. Pictures taken Completed without difficulty  Pain immediately resolved suggesting accurate placement of the medication.  Advised to call if fevers/chills, erythema, induration, drainage, or persistent bleeding. .  Impression: Technically successful ultrasound guided injection.    Impression and Recommendations:     The above documentation has been reviewed and is accurate and complete Margaret Pulley, DO

## 2020-12-10 DIAGNOSIS — Z20822 Contact with and (suspected) exposure to covid-19: Secondary | ICD-10-CM | POA: Diagnosis not present

## 2020-12-11 ENCOUNTER — Telehealth (INDEPENDENT_AMBULATORY_CARE_PROVIDER_SITE_OTHER): Payer: BC Managed Care – PPO | Admitting: Family Medicine

## 2020-12-11 ENCOUNTER — Encounter: Payer: Self-pay | Admitting: Internal Medicine

## 2020-12-11 DIAGNOSIS — U071 COVID-19: Secondary | ICD-10-CM | POA: Diagnosis not present

## 2020-12-11 MED ORDER — MOLNUPIRAVIR EUA 200MG CAPSULE: 4.0000 | ORAL_CAPSULE | Freq: Two times a day (BID) | ORAL | 0 refills | Status: AC

## 2020-12-11 MED ORDER — BENZONATATE 100 MG PO CAPS: 100.0000 mg | ORAL_CAPSULE | Freq: Two times a day (BID) | ORAL | 0 refills | Status: DC | PRN

## 2020-12-11 NOTE — Telephone Encounter (Signed)
Pt is scheduled to see Dr. Maudie Mercury virtually at 6:20 pm tonite

## 2020-12-11 NOTE — Progress Notes (Signed)
Virtual Visit via Video Note  I connected with Margaret [name]  on 12/11/20 at  6:20 PM EDT by a video enabled telemedicine application and verified that I am speaking with the correct person using two identifiers.  Location patient: home, [place] Location provider:work or home office Persons participating in the virtual visit: patient, provider  I discussed the limitations of evaluation and management by telemedicine and the availability of in person appointments. The patient expressed understanding and agreed to proceed.   HPI:  Acute telemedicine visit for Covid19: -Onset: onset 2 days ago, positive home test today -Symptoms include: nasal congestion, headache - resolved with aleve, upset stomach, cough -Denies: Cp, SOB, vomiting, diarrhea, inability to eat/drink/get out of bed -Pertinent past medical history: see below -Pertinent medication allergies:  Allergies  Allergen Reactions   Amoxicillin Hives   Other Anaphylaxis    TREE NUTS   Penicillins Hives and Rash    Hives Has patient had a PCN reaction causing immediate rash, facial/tongue/throat swelling, SOB or lightheadedness with hypotension: No Has patient had a PCN reaction causing severe rash involving mucus membranes or skin necrosis: No Has patient had a PCN reaction that required hospitalization: No Has patient had a PCN reaction occurring within the last 10 years: No If all of the above answers are "NO", then may proceed with Cephalosporin use.  Hives Has patient had a PCN reaction causing immediate rash, facial/tongue/throat swelling, SOB or lightheadedness with hypotension: No Has patient had a PCN reaction causing severe rash involving mucus membranes or skin necrosis: No Has patient had a PCN reaction that required hospitalization: No Has patient had a PCN reaction occurring within the last 10 years: No If all of the above answers are "NO", then may proceed with Cephalosporin use. Hives Has patient had a PCN reaction  causing immediate rash, facial/tongue/throat swelling, SOB or lightheadedness with hypotension: No Has patient had a PCN reaction causing severe rash involving mucus membranes or skin necrosis: No Has patient had a PCN reaction that required hospitalization: No Has patient had a PCN reaction occurring within the last 10 years: No If all of the above answers are "NO", then may proceed with Cephalosporin use.   Macrodantin  [Nitrofurantoin Macrocrystal] Rash   Paroxetine Other (See Comments)    Headache , malaise  -COVID-19 vaccine status: has had 2 doses and 2 boosters -no recent renal check on labs  ROS: See pertinent positives and negatives per HPI.  Past Medical History:  Diagnosis Date   Arthritis    Endometrial cancer ([place])    Exercise-induced asthma    Heart murmur    History of bradycardia 2011   had an episode of HR 38 at time of blood work w/ palpitations and SOB,  work-up done by cardiology (dr dalton Aundra Dubin) stress echo normal on 01/ 2012 and (12/ 2011) holter monitor with PACs with average HR 68/  07-14-2017 per pt no issues since then   Hx of colonic polyps    x3   IBS (irritable bowel syndrome)    OSA (obstructive sleep apnea) 07-14-2017 per had a new study on 07-12-2017 have not heard about results yet   per study 01-06-2005 moderate OSA (AHI 23.3/hr) used cpap, per pt lost weight withthe resolution of symptoms but has gain weight back   Type 2 diabetes mellitus ([facility])    Wears glasses     Past Surgical History:  Procedure Laterality Date   colonoscopy with polypectomy  2012 approx.   DOBUTAMINE STRESS ECHO  04/2010  normal with no evidence of ischemia or infarction   LAPAROSCOPIC CHOLECYSTECTOMY  1996   PLANTAR FASCIA RELEASE     ROBOTIC ASSISTED SUPRACERVICAL HYSTERECTOMY WITH BILATERAL SALPINGO OOPHERECTOMY N/A 07/19/2017   Procedure: XI ROBOTIC ASSISTED  HYSTERECTOMY WITH BILATERAL SALPINGO OOPHORECTOMY; SENTINEL LYMPH NODE BIOPSY;  Surgeon: Everitt Amber, MD;   Location: [facility];  Service: Gynecology;  Laterality: N/A;   SENTINEL NODE BIOPSY N/A 07/19/2017   Procedure: SENTINEL NODE BIOPSY;  Surgeon: Everitt Amber, MD;  Location: [facility];  Service: Gynecology;  Laterality: N/A;   TRANSTHORACIC ECHOCARDIOGRAM  04/17/2010   ef 55%/  trivial AR and TR/ left LAE/       Current Outpatient Medications:    benzonatate (TESSALON) 100 MG capsule, Take 1 capsule (100 mg total) by mouth 2 (two) times daily as needed for cough., Disp: 20 capsule, Rfl: 0   molnupiravir EUA 200 mg CAPS, Take 4 capsules (800 mg total) by mouth 2 (two) times daily for 5 days., Disp: 40 capsule, Rfl: 0   azithromycin (ZITHROMAX) 250 MG tablet, Day 1 take 2 pills, days 2-5 take 1 pill daily (Patient not taking: Reported on 09/18/2020), Disp: 6 tablet, Rfl: 0   BIOTIN PO, Take by mouth daily., Disp: , Rfl:    EPINEPHrine 0.3 mg/0.3 mL IJ SOAJ injection, Inject 0.3 mg into the muscle as needed for anaphylaxis., Disp: 1 each, Rfl: 0   lisinopril (ZESTRIL) 2.5 MG tablet, TAKE 1 TABLET BY MOUTH EVERY DAY, Disp: 90 tablet, Rfl: 1   Melatonin 5 MG CHEW, Chew 3 each by mouth as needed., Disp: , Rfl:    metFORMIN (GLUCOPHAGE-XR) 500 MG 24 hr tablet, TAKE 2 TABLETS BY MOUTH TWICE A DAY, Disp: 360 tablet, Rfl: 1   Omega-3 Fatty Acids (FISH OIL ADULT GUMMIES PO), Take by mouth daily., Disp: , Rfl:    pravastatin (PRAVACHOL) 40 MG tablet, TAKE 1 TABLET BY MOUTH EVERY DAY, Disp: 90 tablet, Rfl: 2   vitamin B-12 (CYANOCOBALAMIN) 500 MCG tablet, Take 500 mcg by mouth daily., Disp: , Rfl:   EXAM:  VITALS per patient if applicable:  GENERAL: alert, oriented, appears well and in no acute distress  HEENT: atraumatic, conjunttiva clear, no obvious abnormalities on inspection of external nose and ears  NECK: normal movements of the head and neck  LUNGS: on inspection no signs of respiratory distress, breathing rate appears normal, no obvious gross SOB, gasping or wheezing  CV: no obvious cyanosis  MS:  moves all visible extremities without noticeable abnormality  PSYCH/NEURO: pleasant and cooperative, no obvious depression or anxiety, speech and thought processing grossly intact  ASSESSMENT AND PLAN:  Discussed the following assessment and plan:  No diagnosis found.   Discussed treatment options (infusions and oral options and risk of drug interactions), ideal treatment window, potential complications, isolation and precautions for COVID-19.  Discussed possibility of rebound with antivirals and the need to reisolate if it should occur for 5 days. Checked for/reviewed any labs done in the last 90 days with GFR listed in HPI if available. After lengthy discussion, the patient opted for treatment with molnupiravir due to being higher risk for complications of covid or severe disease and other factors. Discussed EUA status of this drug and the fact that there is preliminary limited knowledge of risks/interactions/side effects per EUA document vs possible benefits and precautions. This information was shared with patient during the visit and also was provided in patient instructions. Also, advised that patient discuss risks/interactions and use with pharmacist/treatment team  as well. The patient did want a prescription for cough, Tessalon Rx sent.  Other symptomatic care measures summarized in patient instructions. Work/School slipped offered:  declined Advised to seek prompt in person care if worsening, new symptoms arise, or if is not improving with treatment. Discussed options for inperson care if PCP office not available. Did let this patient know that I only do telemedicine on Tuesdays and Thursdays for [place]. Advised to schedule follow up visit with PCP or [facility] if any further questions or concerns to avoid delays in care.   I discussed the assessment and treatment plan with the patient. The patient was provided an opportunity to ask questions and all were answered. The patient agreed with the plan  and demonstrated an understanding of the instructions.     Lucretia Kern, DO

## 2020-12-11 NOTE — Patient Instructions (Addendum)
HOME CARE TIPS:  -I sent the medication(s) we discussed to your pharmacy: Meds ordered this encounter  Medications   benzonatate (TESSALON) 100 MG capsule    Sig: Take 1 capsule (100 mg total) by mouth 2 (two) times daily as needed for cough.    Dispense:  20 capsule    Refill:  0   molnupiravir EUA 200 mg CAPS    Sig: Take 4 capsules (800 mg total) by mouth 2 (two) times daily for 5 days.    Dispense:  40 capsule    Refill:  0     -I sent in the [place] treatment or referral you requested per our discussion. Please see the information provided below and discuss further with the pharmacist/treatment team.   -If taking molnupiravir, there is a chance of rebound illness after finishing your treatment. If you become sick again please isolate for an additional 5 days.   -can use tylenol or aleve if needed for fevers, aches and pains per instructions  -can use nasal saline a few times per day if you have nasal congestion  -stay hydrated, drink plenty of fluids and eat small healthy meals - avoid dairy  -follow up with your doctor in 2-3 days unless improving and feeling better  -stay home while sick, except to seek medical care. If you have COVID19, ideally it would be best to stay home for a full 10 days since the onset of symptoms PLUS one day of no fever and feeling better. Wear a good mask that fits snugly (such as N95 or KN95) if around others to reduce the risk of transmission.  It was nice to meet you today, and I really hope you are feeling better soon. I help [place] [facility] out with telemedicine visits on Tuesdays and Thursdays and am available for visits on those days. If you have any concerns or questions following this visit please schedule a follow up visit with your Primary Care doctor or seek care at a local urgent care clinic to avoid delays in care.    Seek in person care or schedule a follow up video visit promptly if your symptoms worsen, new concerns arise or you are not  improving with treatment. Call 911 and/or seek emergency care if your symptoms are severe or life threatening.    Fact Sheet for Patients And Caregivers Emergency Use Authorization (EUA) Of LAGEVRIOT (molnupiravir) capsules For Coronavirus Disease 2019 (COVID-19)  What is the most important information I should know about LAGEVRIO? LAGEVRIO may cause serious side effects, including: ? LAGEVRIO may cause harm to your unborn baby. It is not known if LAGEVRIO will harm your baby if you take LAGEVRIO during pregnancy. o LAGEVRIO is not recommended for use in pregnancy. o LAGEVRIO has not been studied in pregnancy. LAGEVRIO was studied in pregnant animals only. When LAGEVRIO was given to pregnant animals, LAGEVRIO caused harm to their unborn babies. o You and your healthcare provider may decide that you should take LAGEVRIO during pregnancy if there are no other COVID-19 treatment options approved or authorized by the FDA that are accessible or clinically appropriate for you. o If you and your healthcare provider decide that you should take LAGEVRIO during pregnancy, you and your healthcare provider should discuss the known and potential benefits and the potential risks of taking LAGEVRIO during pregnancy. For individuals who are able to become pregnant: ? You should use a reliable method of birth control (contraception) consistently and correctly during treatment with LAGEVRIO and for 4 days  after the last dose of LAGEVRIO. Talk to your healthcare provider about reliable birth control methods. ? Before starting treatment with [facility] your healthcare provider may do a pregnancy test to see if you are pregnant before starting treatment with LAGEVRIO. ? Tell your healthcare provider right away if you become pregnant or think you may be pregnant during treatment with LAGEVRIO. Pregnancy Surveillance Program: ? There is a pregnancy surveillance program for individuals who take LAGEVRIO  during pregnancy. The purpose of this program is to collect information about the health of you and your baby. Talk to your healthcare provider about how to take part in this program. ? If you take LAGEVRIO during pregnancy and you agree to participate in the pregnancy surveillance program and allow your healthcare provider to share your information with [place], then your healthcare provider will report your use of [place] during pregnancy to [place]. by calling [phone] or PeacefulBlog.es. For individuals who are sexually active with partners who are able to become pregnant: ? It is not known if LAGEVRIO can affect sperm. While the risk is regarded as low, animal studies to fully assess the potential for LAGEVRIO to affect the babies of males treated with LAGEVRIO have not been completed. A reliable method of birth control (contraception) should be used consistently and correctly during treatment with LAGEVRIO and for at least 3 months after the last dose. The risk to sperm beyond 3 months is not known. Studies to understand the risk to sperm beyond 3 months are ongoing. Talk to your healthcare provider about reliable birth control methods. Talk to your healthcare provider if you have questions or concerns about how LAGEVRIO may affect sperm. You are being given this fact sheet because your healthcare provider believes it is necessary to provide you with LAGEVRIO for the treatment of adults with mild-to-moderate coronavirus disease 2019 (COVID-19) with positive results of direct SARS-CoV-2 viral testing, and who are at high risk for progression to severe COVID-19 including hospitalization or death, and for whom other COVID-19 treatment options approved or authorized by the FDA are not accessible or clinically appropriate. The U.S. Food and Drug Administration (FDA) has issued an Emergency Use Authorization (EUA) to make LAGEVRIO available  during the COVID-19 pandemic (for more details about an EUA please see "What is an Emergency Use Authorization?" at the end of this document). LAGEVRIO is not an FDA-approved medicine in the [place]. Read this Fact Sheet for information about LAGEVRIO. Talk to your healthcare provider about your options if you have any questions. It is your choice to take LAGEVRIO.  What is COVID-19? COVID-19 is caused by a virus called a coronavirus. You can get COVID-19 through close contact with another person who has the virus. COVID-19 illnesses have ranged from very mild-to-severe, including illness resulting in death. While information so far suggests that most COVID-19 illness is mild, serious illness can happen and may cause some of your other medical conditions to become worse. Older people and people of all ages with severe, long lasting (chronic) medical conditions like heart disease, lung disease and diabetes, for example seem to be at higher risk of being hospitalized for COVID-19.  What is LAGEVRIO? LAGEVRIO is an investigational medicine used to treat mild-to-moderate COVID-19 in adults: ? with positive results of direct SARS-CoV-2 viral testing, and ? who are at high risk for progression to severe COVID-19 including hospitalization or death, and for whom other COVID-19 treatment options approved or authorized by the  FDA are not accessible or clinically appropriate. The FDA has authorized the emergency use of LAGEVRIO for the treatment of mild-tomoderate COVID-19 in adults under an EUA. For more information on EUA, see the "What is an Emergency Use Authorization (EUA)?" section at the end of this Fact Sheet. LAGEVRIO is not authorized: ? for use in people less than 34 years of age. ? for prevention of COVID-19. ? for people needing hospitalization for COVID-19. ? for use for longer than 5 consecutive days.  What should I tell my healthcare provider before I take LAGEVRIO? Tell  your healthcare provider if you: ? Have any allergies ? Are breastfeeding or plan to breastfeed ? Have any serious illnesses ? Are taking any medicines (prescription, over-the-counter, vitamins, or herbal products).  How do I take LAGEVRIO? ? Take LAGEVRIO exactly as your healthcare provider tells you to take it. ? Take 4 capsules of LAGEVRIO every 12 hours (for example, at 8 am and at 8 pm) ? Take LAGEVRIO for 5 days. It is important that you complete the full 5 days of treatment with LAGEVRIO. Do not stop taking LAGEVRIO before you complete the full 5 days of treatment, even if you feel better. ? Take LAGEVRIO with or without food. ? You should stay in isolation for as long as your healthcare provider tells you to. Talk to your healthcare provider if you are not sure about how to properly isolate while you have COVID-19. ? Swallow LAGEVRIO capsules whole. Do not open, break, or crush the capsules. If you cannot swallow capsules whole, tell your healthcare provider. ? What to do if you miss a dose: o If it has been less than 10 hours since the missed dose, take it as soon as you remember o If it has been more than 10 hours since the missed dose, skip the missed dose and take your dose at the next scheduled time. ? Do not double the dose of LAGEVRIO to make up for a missed dose.  What are the important possible side effects of LAGEVRIO? ? See, "What is the most important information I should know about LAGEVRIO?" ? Allergic Reactions. Allergic reactions can happen in people taking LAGEVRIO, even after only 1 dose. Stop taking LAGEVRIO and call your healthcare provider right away if you get any of the following symptoms of an allergic reaction: o hives o rapid heartbeat o trouble swallowing or breathing o swelling of the mouth, lips, or face o throat tightness o hoarseness o skin rash The most common side effects of LAGEVRIO are: ? diarrhea ? nausea ? dizziness These are not  all the possible side effects of LAGEVRIO. Not many people have taken LAGEVRIO. Serious and unexpected side effects may happen. This medicine is still being studied, so it is possible that all of the risks are not known at this time.  What other treatment choices are there?  Veklury (remdesivir) is FDA-approved as an intravenous (IV) infusion for the treatment of mildto-moderate [id number] in certain adults and children. Talk with your doctor to see if Marijean Heath is appropriate for you. Like LAGEVRIO, FDA may also allow for the emergency use of other medicines to treat people with COVID-19. Go to LacrosseProperties.si for more information. It is your choice to be treated or not to be treated with LAGEVRIO. Should you decide not to take it, it will not change your standard medical care.  What if I am breastfeeding? Breastfeeding is not recommended during treatment with LAGEVRIO and for 4 days after  the last dose of LAGEVRIO. If you are breastfeeding or plan to breastfeed, talk to your healthcare provider about your options and specific situation before taking LAGEVRIO.  How do I report side effects with LAGEVRIO? Contact your healthcare provider if you have any side effects that bother you or do not go away. Report side effects to FDA MedWatch at SmoothHits.hu or call 1-800-FDA-1088 (1- ([phone] 644-2530).  How should I store [place]? ? Store LAGEVRIO capsules at room temperature between 16F to 45F (20C to 25C). ? Keep LAGEVRIO and all medicines out of the reach of children and pets. How can I learn more about COVID-19? ? Ask your healthcare provider. ? Visit SeekRooms.co.uk ? Contact your local or state public health department. ? Call [place] at [phone] (toll free in the [place] ? Visit www.molnupiravir.com  What Is an Emergency Use Authorization (EUA)? The  [place] FDA has made [place] available under an emergency access mechanism called an Emergency Use Authorization (EUA) The EUA is supported by a Presenter, broadcasting Health and Human Service (HHS) declaration that circumstances exist to justify emergency use of drugs and biological products during the COVID-19 pandemic. LAGEVRIO for the treatment of mild-to-moderate COVID-19 in adults with positive results of direct SARS-CoV-2 viral testing, who are at high risk for progression to severe COVID-19, including hospitalization or death, and for whom alternative COVID-19 treatment options approved or authorized by FDA are not accessible or clinically appropriate, has not undergone the same type of review as an FDA-approved product. In issuing an EUA under the KVQQV-95 public health emergency, the FDA has determined, among other things, that based on the total amount of scientific evidence available including data from adequate and well-controlled clinical trials, if available, it is reasonable to believe that the product may be effective for diagnosing, treating, or preventing COVID-19, or a serious or life-threatening disease or condition caused by COVID-19; that the known and potential benefits of the product, when used to diagnose, treat, or prevent such disease or condition, outweigh the known and potential risks of such product; and that there are no adequate, approved, and available alternatives.  All of these criteria must be met to allow for the product to be used in the treatment of patients during the COVID-19 pandemic. The EUA for LAGEVRIO is in effect for the duration of the COVID-19 declaration justifying emergency use of LAGEVRIO, unless terminated or revoked (after which LAGEVRIO may no longer be used under the EUA). For patent information: http://rogers.info/ Copyright  2021-2022 [place] [place], [place] and its affiliates. All rights  reserved. [id number] Revised: March 2022

## 2020-12-29 ENCOUNTER — Encounter: Payer: Self-pay | Admitting: Family Medicine

## 2020-12-31 NOTE — Progress Notes (Signed)
Zach Yanissa Michalsky [place] [address] [place] Phone: [phone] Subjective:   [name], am serving as a scribe for Dr. Hulan Saas. This visit occurred during the SARS-CoV-2 public health emergency.  Safety protocols were in place, including screening questions prior to the visit, additional usage of staff PPE, and extensive cleaning of exam room while observing appropriate contact time as indicated for disinfecting solutions.   I'm seeing this patient by the request  of:  Hoyt Koch, MD  CC: Hip pain follow-up  [id number]  11/20/2020 Patient responded well.  Given injection today.  Had improvement almost immediately.  Discussed which activities to do which wants to avoid.  Increase activity slowly.  Patient declined formal physical therapy today.  We will consider this if this continues.  We also discussed the potential for other imaging.  Follow-up again in 6 to 8 weeks.  Updated 01/01/2021 Margaret [name] is a 65 y.o. female coming in with complaint of right hip pain. She states that the hip is doing better. She feels that the pain comes and goes, but overall she doing better.  Patient states he is feeling somewhat better overall.  Not having as much severe pain.  Is averaging 10-20,000 steps daily.  Xray IMPRESSION: 11/20/2020 Osseous demineralization with mild degenerative changes of both hip joints. Patient's past medical history is significant for uterine cancer.  Did have a hysterectomy multiple years ago     Past Medical History:  Diagnosis Date   Arthritis    Endometrial cancer ([place])    Exercise-induced asthma    Heart murmur    History of bradycardia 2011   had an episode of HR 38 at time of blood work w/ palpitations and SOB,  work-up done by cardiology (dr dalton Aundra Dubin) stress echo normal on 01/ 2012 and (12/ 2011) holter monitor with PACs with average HR 68/  07-14-2017 per pt no issues since then   Hx of  colonic polyps    x3   IBS (irritable bowel syndrome)    OSA (obstructive sleep apnea) 07-14-2017 per had a new study on 07-12-2017 have not heard about results yet   per study 01-06-2005 moderate OSA (AHI 23.3/hr) used cpap, per pt lost weight withthe resolution of symptoms but has gain weight back   Type 2 diabetes mellitus ([place])    Wears glasses    Past Surgical History:  Procedure Laterality Date   colonoscopy with polypectomy  2012 approx.   DOBUTAMINE STRESS ECHO  04/2010   normal with no evidence of ischemia or infarction   LAPAROSCOPIC CHOLECYSTECTOMY  1996   PLANTAR FASCIA RELEASE     ROBOTIC ASSISTED SUPRACERVICAL HYSTERECTOMY WITH BILATERAL SALPINGO OOPHERECTOMY N/A 07/19/2017   Procedure: XI ROBOTIC ASSISTED  HYSTERECTOMY WITH BILATERAL SALPINGO OOPHORECTOMY; SENTINEL LYMPH NODE BIOPSY;  Surgeon: Everitt Amber, MD;  Location: [facility];  Service: Gynecology;  Laterality: N/A;   SENTINEL NODE BIOPSY N/A 07/19/2017   Procedure: SENTINEL NODE BIOPSY;  Surgeon: Everitt Amber, MD;  Location: [facility];  Service: Gynecology;  Laterality: N/A;   TRANSTHORACIC ECHOCARDIOGRAM  04/17/2010   ef 55%/  trivial AR and TR/ left LAE/     Social History   Socioeconomic History   Marital status: Divorced    Spouse name: Not on file   Number of children: Not on file   Years of education: Not on file   Highest education level: Not on file  Occupational History   Occupation: Administrator  Employer: [employer]  Tobacco Use   Smoking status: Former    Types: Cigarettes   Smokeless tobacco: Never   Tobacco comments:    07-14-2017 per pt Smoked [age] ONLY as teen  Vaping Use   Vaping Use: Never used  Substance and Sexual Activity   Alcohol use: Yes    Comment: rarely   Drug use: No   Sexual activity: Not on file  Other Topics Concern   Not on file  Social History Narrative   Not on file   Social Determinants of Health   Financial Resource Strain: Not on file  Food  Insecurity: Not on file  Transportation Needs: Not on file  Physical Activity: Not on file  Stress: Not on file  Social Connections: Not on file   Allergies  Allergen Reactions   Amoxicillin Hives   Other Anaphylaxis    TREE NUTS   Penicillins Hives and Rash    Hives Has patient had a PCN reaction causing immediate rash, facial/tongue/throat swelling, SOB or lightheadedness with hypotension: No Has patient had a PCN reaction causing severe rash involving mucus membranes or skin necrosis: No Has patient had a PCN reaction that required hospitalization: No Has patient had a PCN reaction occurring within the last 10 years: No If all of the above answers are "NO", then may proceed with Cephalosporin use.  Hives Has patient had a PCN reaction causing immediate rash, facial/tongue/throat swelling, SOB or lightheadedness with hypotension: No Has patient had a PCN reaction causing severe rash involving mucus membranes or skin necrosis: No Has patient had a PCN reaction that required hospitalization: No Has patient had a PCN reaction occurring within the last 10 years: No If all of the above answers are "NO", then may proceed with Cephalosporin use. Hives Has patient had a PCN reaction causing immediate rash, facial/tongue/throat swelling, SOB or lightheadedness with hypotension: No Has patient had a PCN reaction causing severe rash involving mucus membranes or skin necrosis: No Has patient had a PCN reaction that required hospitalization: No Has patient had a PCN reaction occurring within the last 10 years: No If all of the above answers are "NO", then may proceed with Cephalosporin use.   Macrodantin  [Nitrofurantoin Macrocrystal] Rash   Paroxetine Other (See Comments)    Headache , malaise   Family History  Problem Relation Age of Onset   Colon polyps Father    COPD Father    Diabetes Father        diet controlled   Heart attack Mother 78   COPD Mother    Lung cancer Mother     Peripheral vascular disease Mother    Hypertension Brother        2 bro   Stroke Neg Hx     Current Outpatient Medications (Endocrine & Metabolic):    metFORMIN (GLUCOPHAGE-XR) 500 MG 24 hr tablet, TAKE 2 TABLETS BY MOUTH TWICE A DAY  Current Outpatient Medications (Cardiovascular):    EPINEPHrine 0.3 mg/0.3 mL IJ SOAJ injection, Inject 0.3 mg into the muscle as needed for anaphylaxis.   lisinopril (ZESTRIL) 2.5 MG tablet, TAKE 1 TABLET BY MOUTH EVERY DAY   pravastatin (PRAVACHOL) 40 MG tablet, TAKE 1 TABLET BY MOUTH EVERY DAY  Current Outpatient Medications (Respiratory):    benzonatate (TESSALON) 100 MG capsule, Take 1 capsule (100 mg total) by mouth 2 (two) times daily as needed for cough.   Current Outpatient Medications (Hematological):    vitamin B-12 (CYANOCOBALAMIN) 500 MCG  tablet, Take 500 mcg by mouth daily.  Current Outpatient Medications (Other):    azithromycin (ZITHROMAX) 250 MG tablet, Day 1 take 2 pills, days 2-5 take 1 pill daily (Patient not taking: Reported on 09/18/2020)   BIOTIN PO, Take by mouth daily.   Melatonin 5 MG CHEW, Chew 3 each by mouth as needed.   Omega-3 Fatty Acids (FISH OIL ADULT GUMMIES PO), Take by mouth daily.   Reviewed prior external information including notes and imaging from  primary care provider As well as notes that were available from care everywhere and other healthcare systems.  This also included the treatment the patient undergone in 2019 for hysterectomy.  Past medical history, social, surgical and family history all reviewed in electronic medical record.  No pertanent information unless stated regarding to the chief complaint.   Review of Systems:  No headache, visual changes, nausea, vomiting, diarrhea, constipation, dizziness, abdominal pain, skin rash, fevers, chills, night sweats, weight loss, swollen lymph nodes, body aches, joint swelling, chest pain, shortness of breath, mood changes. POSITIVE muscle aches  Objective   Blood pressure 116/72, pulse (!) 103, height '5\' 4"'$  (1.626 m), weight 197 lb (89.4 kg), SpO2 98 %.   General: No apparent distress alert and oriented x3 mood and affect normal, dressed appropriately.  Overweight HEENT: Pupils equal, extraocular movements intact  Respiratory: Patient's speak in full sentences and does not appear short of breath  Cardiovascular: No lower extremity edema, non tender, no erythema  Gait very minorly antalgic MSK: Very minimal discomfort actually on exam today.  Significant improvement.  Patient sitting comfortably.  Neurovascular intact distally.    Impression and Recommendations:     The above documentation has been reviewed and is accurate and complete Lyndal Pulley, DO

## 2021-01-01 ENCOUNTER — Other Ambulatory Visit: Payer: Self-pay

## 2021-01-01 ENCOUNTER — Encounter: Payer: Self-pay | Admitting: Family Medicine

## 2021-01-01 ENCOUNTER — Ambulatory Visit (INDEPENDENT_AMBULATORY_CARE_PROVIDER_SITE_OTHER): Payer: BC Managed Care – PPO | Admitting: Family Medicine

## 2021-01-01 VITALS — BP 116/72 | HR 103 | Ht 64.0 in | Wt 197.0 lb

## 2021-01-01 DIAGNOSIS — M7061 Trochanteric bursitis, right hip: Secondary | ICD-10-CM | POA: Diagnosis not present

## 2021-01-01 DIAGNOSIS — Z859 Personal history of malignant neoplasm, unspecified: Secondary | ICD-10-CM

## 2021-01-01 DIAGNOSIS — M2550 Pain in unspecified joint: Secondary | ICD-10-CM

## 2021-01-01 DIAGNOSIS — C541 Malignant neoplasm of endometrium: Secondary | ICD-10-CM

## 2021-01-01 LAB — CBC WITH DIFFERENTIAL/PLATELET
Basophils Absolute: 0.1 10*3/uL (ref 0.0–0.1)
Basophils Relative: 1.3 % (ref 0.0–3.0)
Eosinophils Absolute: 0.3 10*3/uL (ref 0.0–0.7)
Eosinophils Relative: 4.4 % (ref 0.0–5.0)
HCT: 41.2 % (ref 36.0–46.0)
Hemoglobin: 13.7 g/dL (ref 12.0–15.0)
Lymphocytes Relative: 28.6 % (ref 12.0–46.0)
Lymphs Abs: 1.7 10*3/uL (ref 0.7–4.0)
MCHC: 33.3 g/dL (ref 30.0–36.0)
MCV: 82.7 fl (ref 78.0–100.0)
Monocytes Absolute: 0.3 10*3/uL (ref 0.1–1.0)
Monocytes Relative: 5.7 % (ref 3.0–12.0)
Neutro Abs: 3.6 10*3/uL (ref 1.4–7.7)
Neutrophils Relative %: 60 % (ref 43.0–77.0)
Platelets: 283 10*3/uL (ref 150.0–400.0)
RBC: 4.98 Mil/uL (ref 3.87–5.11)
RDW: 13.9 % (ref 11.5–15.5)
WBC: 6 10*3/uL (ref 4.0–10.5)

## 2021-01-01 LAB — FERRITIN: Ferritin: 37.1 ng/mL (ref 10.0–291.0)

## 2021-01-01 LAB — IBC PANEL
Iron: 43 ug/dL (ref 42–145)
Saturation Ratios: 10.5 % — ABNORMAL LOW (ref 20.0–50.0)
TIBC: 410.2 ug/dL (ref 250.0–450.0)
Transferrin: 293 mg/dL (ref 212.0–360.0)

## 2021-01-01 LAB — TSH: TSH: 1.2 u[IU]/mL (ref 0.35–5.50)

## 2021-01-01 LAB — SEDIMENTATION RATE: Sed Rate: 13 mm/hr (ref 0–30)

## 2021-01-01 LAB — VITAMIN D 25 HYDROXY (VIT D DEFICIENCY, FRACTURES): VITD: 41.59 ng/mL (ref 30.00–100.00)

## 2021-01-01 LAB — URIC ACID: Uric Acid, Serum: 5.5 mg/dL (ref 2.4–7.0)

## 2021-01-01 NOTE — Patient Instructions (Signed)
Keep walking Schedule Bone Density See you again in 2 months

## 2021-01-01 NOTE — Assessment & Plan Note (Signed)
Encourage patient to continue to work on weight loss. 

## 2021-01-01 NOTE — Assessment & Plan Note (Signed)
Improvement overall walking significant language at this point.  Encouraged her to continue to do so and continue to work on the weight loss.  Patient's x-rays of the hip do show some demineralization and history of a hysterectomy so do feel that a possible bone density is necessary and we will do laboratory work-up.  Depending on findings we will adjust treatment accordingly.  Follow-up with me again in 6 to 8 weeks

## 2021-01-03 LAB — PTH, INTACT AND CALCIUM
Calcium: 9 mg/dL (ref 8.6–10.4)
PTH: 30 pg/mL (ref 16–77)

## 2021-01-03 LAB — CALCIUM, IONIZED: Calcium, Ion: 4.81 mg/dL (ref 4.8–5.6)

## 2021-01-05 ENCOUNTER — Encounter: Payer: Self-pay | Admitting: Family Medicine

## 2021-01-05 ENCOUNTER — Ambulatory Visit (INDEPENDENT_AMBULATORY_CARE_PROVIDER_SITE_OTHER)
Admission: RE | Admit: 2021-01-05 | Discharge: 2021-01-05 | Disposition: A | Discharge status: Home / Self Care | Payer: BC Managed Care – PPO | Source: Ambulatory Visit | Attending: Family Medicine | Admitting: Family Medicine

## 2021-01-05 ENCOUNTER — Other Ambulatory Visit: Payer: Self-pay

## 2021-01-05 DIAGNOSIS — M2550 Pain in unspecified joint: Secondary | ICD-10-CM | POA: Diagnosis not present

## 2021-01-06 DIAGNOSIS — M2550 Pain in unspecified joint: Secondary | ICD-10-CM | POA: Diagnosis not present

## 2021-02-26 NOTE — Progress Notes (Signed)
Margaret [name] [place] Phone: [phone] Subjective:   [name], am serving as a scribe for Dr. Hulan Saas. This visit occurred during the SARS-CoV-2 public health emergency.  Safety protocols were in place, including screening questions prior to the visit, additional usage of staff PPE, and extensive cleaning of exam room while observing appropriate contact time as indicated for disinfecting solutions.   I'm seeing this patient by the request  of:  Hoyt Koch, MD  CC: Right hip pain follow-up.  [id number]  01/01/2021 Improvement overall walking significant language at this point.  Encouraged her to continue to do so and continue to work on the weight loss.  Patient's x-rays of the hip do show some demineralization and history of a hysterectomy so do feel that a possible bone density is necessary and we will do laboratory work-up.  Depending on findings we will adjust treatment accordingly.  Follow-up with me again in 6 to 8 weeks  Update 03/03/2021 Margaret [name] is a 65 y.o. female coming in with complaint of R hip pain. GT injection on 11/20/2020. Patient states that she continues to have pain over lateral aspect of R hip. Pain will radiate into the groin on R side at times as well as into the R calf. Had relief from injection for one month. Would like to review labwork and bone density results today.        Past Medical History:  Diagnosis Date   Arthritis    Endometrial cancer ([place])    Exercise-induced asthma    Heart murmur    History of bradycardia 2011   had an episode of HR 38 at time of blood work w/ palpitations and SOB,  work-up done by cardiology (dr dalton Aundra Dubin) stress echo normal on 01/ 2012 and (12/ 2011) holter monitor with PACs with average HR 68/  07-14-2017 per pt no issues since then   Hx of colonic polyps    x3   IBS (irritable bowel syndrome)    OSA (obstructive sleep apnea)  07-14-2017 per had a new study on 07-12-2017 have not heard about results yet   per study 01-06-2005 moderate OSA (AHI 23.3/hr) used cpap, per pt lost weight withthe resolution of symptoms but has gain weight back   Type 2 diabetes mellitus ([place])    Wears glasses    Past Surgical History:  Procedure Laterality Date   colonoscopy with polypectomy  2012 approx.   DOBUTAMINE STRESS ECHO  04/2010   normal with no evidence of ischemia or infarction   LAPAROSCOPIC CHOLECYSTECTOMY  1996   PLANTAR FASCIA RELEASE     ROBOTIC ASSISTED SUPRACERVICAL HYSTERECTOMY WITH BILATERAL SALPINGO OOPHERECTOMY N/A 07/19/2017   Procedure: XI ROBOTIC ASSISTED  HYSTERECTOMY WITH BILATERAL SALPINGO OOPHORECTOMY; SENTINEL LYMPH NODE BIOPSY;  Surgeon: Everitt Amber, MD;  Location: [facility];  Service: Gynecology;  Laterality: N/A;   SENTINEL NODE BIOPSY N/A 07/19/2017   Procedure: SENTINEL NODE BIOPSY;  Surgeon: Everitt Amber, MD;  Location: [facility];  Service: Gynecology;  Laterality: N/A;   TRANSTHORACIC ECHOCARDIOGRAM  04/17/2010   ef 55%/  trivial AR and TR/ left LAE/     Social History   Socioeconomic History   Marital status: Divorced    Spouse name: Not on file   Number of children: Not on file   Years of education: Not on file   Highest education level: Not on file  Occupational History   Occupation: Administrator  Employer:  [employer]  Tobacco Use   Smoking status: Former    Types: Cigarettes   Smokeless tobacco: Never   Tobacco comments:    07-14-2017 per pt Smoked [age] ONLY as teen  Vaping Use   Vaping Use: Never used  Substance and Sexual Activity   Alcohol use: Yes    Comment: rarely   Drug use: No   Sexual activity: Not on file  Other Topics Concern   Not on file  Social History Narrative   Not on file   Social Determinants of Health   Financial Resource Strain: Not on file  Food Insecurity: Not on file  Transportation Needs: Not on file  Physical Activity: Not on file   Stress: Not on file  Social Connections: Not on file   Allergies  Allergen Reactions   Amoxicillin Hives   Other Anaphylaxis    TREE NUTS   Penicillins Hives and Rash    Hives Has patient had a PCN reaction causing immediate rash, facial/tongue/throat swelling, SOB or lightheadedness with hypotension: No Has patient had a PCN reaction causing severe rash involving mucus membranes or skin necrosis: No Has patient had a PCN reaction that required hospitalization: No Has patient had a PCN reaction occurring within the last 10 years: No If all of the above answers are "NO", then may proceed with Cephalosporin use.  Hives Has patient had a PCN reaction causing immediate rash, facial/tongue/throat swelling, SOB or lightheadedness with hypotension: No Has patient had a PCN reaction causing severe rash involving mucus membranes or skin necrosis: No Has patient had a PCN reaction that required hospitalization: No Has patient had a PCN reaction occurring within the last 10 years: No If all of the above answers are "NO", then may proceed with Cephalosporin use. Hives Has patient had a PCN reaction causing immediate rash, facial/tongue/throat swelling, SOB or lightheadedness with hypotension: No Has patient had a PCN reaction causing severe rash involving mucus membranes or skin necrosis: No Has patient had a PCN reaction that required hospitalization: No Has patient had a PCN reaction occurring within the last 10 years: No If all of the above answers are "NO", then may proceed with Cephalosporin use.   Macrodantin  [Nitrofurantoin Macrocrystal] Rash   Paroxetine Other (See Comments)    Headache , malaise   Family History  Problem Relation Age of Onset   Colon polyps Father    COPD Father    Diabetes Father        diet controlled   Heart attack Mother 61   COPD Mother    Lung cancer Mother    Peripheral vascular disease Mother    Hypertension Brother        2 bro   Stroke Neg Hx      Current Outpatient Medications (Endocrine & Metabolic):    metFORMIN (GLUCOPHAGE-XR) 500 MG 24 hr tablet, TAKE 2 TABLETS BY MOUTH TWICE A DAY  Current Outpatient Medications (Cardiovascular):    EPINEPHrine 0.3 mg/0.3 mL IJ SOAJ injection, Inject 0.3 mg into the muscle as needed for anaphylaxis.   lisinopril (ZESTRIL) 2.5 MG tablet, TAKE 1 TABLET BY MOUTH EVERY DAY   pravastatin (PRAVACHOL) 40 MG tablet, TAKE 1 TABLET BY MOUTH EVERY DAY  Current Outpatient Medications (Respiratory):    benzonatate (TESSALON) 100 MG capsule, Take 1 capsule (100 mg total) by mouth 2 (two) times daily as needed for cough.   Current Outpatient Medications (Hematological):    vitamin B-12 (CYANOCOBALAMIN) 500 MCG  tablet, Take 500 mcg by mouth daily.  Current Outpatient Medications (Other):    BIOTIN PO, Take by mouth daily.   Melatonin 5 MG CHEW, Chew 3 each by mouth as needed.   Omega-3 Fatty Acids (FISH OIL ADULT GUMMIES PO), Take by mouth daily.   azithromycin (ZITHROMAX) 250 MG tablet, Day 1 take 2 pills, days 2-5 take 1 pill daily (Patient not taking: Reported on 09/18/2020)   Reviewed prior external information including notes and imaging from  primary care provider As well as notes that were available from care everywhere and other healthcare systems.  Past medical history, social, surgical and family history all reviewed in electronic medical record.  No pertanent information unless stated regarding to the chief complaint.   Review of Systems:  No headache, visual changes, nausea, vomiting, diarrhea, constipation, dizziness, abdominal pain, skin rash, fevers, chills, night sweats, weight loss, swollen lymph nodes, body aches, joint swelling, chest pain, shortness of breath, mood changes. POSITIVE muscle aches  Objective  Blood pressure 110/82, pulse 96, height 5\' 4"  (1.626 m), weight 200 lb (90.7 kg), SpO2 98 %.   General: No apparent distress alert and oriented x3 mood and affect normal,  dressed appropriately.  HEENT: Pupils equal, extraocular movements intact  Respiratory: Patient's speak in full sentences and does not appear short of breath  Cardiovascular: No lower extremity edema, non tender, no erythema  Gait normal with good balance and coordination.  MSK: Right hip exam severely tender to palpation over the greater trochanteric area.  No masses appreciated.  Positive FABER test.  Mild limited internal range of motion of the hip.  Very mild groin pain.  Mild tenderness noted in the paraspinal musculature especially around the L3, L4 and L5 paraspinal musculature on the right greater than left.   Procedure: Real-time Ultrasound Guided Injection of right greater trochanteric bursitis secondary to patient's body habitus Device: GE Logiq Q7 Ultrasound guided injection is preferred based studies that show increased duration, increased effect, greater accuracy, decreased procedural pain, increased response rate, and decreased cost with ultrasound guided versus blind injection.  Verbal informed consent obtained.  Time-out conducted.  Noted no overlying erythema, induration, or other signs of local infection.  Skin prepped in a sterile fashion.  Local anesthesia: Topical Ethyl chloride.  With sterile technique and under real time ultrasound guidance:  Greater trochanteric area was visualized and patient's bursa was noted. A 22-gauge 3 inch needle was inserted and 2 cc of 0.5% Marcaine and 1 cc of Kenalog 40 mg/dL was injected. Pictures taken Completed without difficulty  Pain immediately resolved suggesting accurate placement of the medication.  Advised to call if fevers/chills, erythema, induration, drainage, or persistent bleeding.  Impression: Technically successful ultrasound guided injection.    Impression and Recommendations:    The above documentation has been reviewed and is accurate and complete Lyndal Pulley, DO

## 2021-03-03 ENCOUNTER — Ambulatory Visit (INDEPENDENT_AMBULATORY_CARE_PROVIDER_SITE_OTHER): Payer: BC Managed Care – PPO | Admitting: Family Medicine

## 2021-03-03 ENCOUNTER — Encounter: Payer: Self-pay | Admitting: Family Medicine

## 2021-03-03 ENCOUNTER — Other Ambulatory Visit: Payer: Self-pay

## 2021-03-03 ENCOUNTER — Ambulatory Visit: Payer: Self-pay

## 2021-03-03 VITALS — BP 110/82 | HR 96 | Ht 64.0 in | Wt 200.0 lb

## 2021-03-03 DIAGNOSIS — Z6834 Body mass index (BMI) 34.0-34.9, adult: Secondary | ICD-10-CM | POA: Diagnosis not present

## 2021-03-03 DIAGNOSIS — M25551 Pain in right hip: Secondary | ICD-10-CM

## 2021-03-03 DIAGNOSIS — S76011A Strain of muscle, fascia and tendon of right hip, initial encounter: Secondary | ICD-10-CM

## 2021-03-03 DIAGNOSIS — Z01419 Encounter for gynecological examination (general) (routine) without abnormal findings: Secondary | ICD-10-CM | POA: Diagnosis not present

## 2021-03-03 NOTE — Patient Instructions (Addendum)
CoQ10 200mg   Iron 65mg  with Vit C 500mg  Send message in 2 weeks if not better will consider MRI lumbar See me again in 2 months

## 2021-03-03 NOTE — Assessment & Plan Note (Signed)
Patient given injection today.  Tolerated the procedure well.  MRI only showed some intersubstance tearing noted.  Ultrasound is still consistent with this.  Hopefully this does help with some of the pain.  Discussed icing regimen.  Discussed differential includes lumbar radiculopathy.  Follow-up with me again in 6 to 8 weeks.

## 2021-03-05 ENCOUNTER — Other Ambulatory Visit: Payer: Self-pay | Admitting: Internal Medicine

## 2021-03-27 DIAGNOSIS — Z1231 Encounter for screening mammogram for malignant neoplasm of breast: Secondary | ICD-10-CM | POA: Diagnosis not present

## 2021-04-14 ENCOUNTER — Other Ambulatory Visit: Payer: Self-pay | Admitting: Internal Medicine

## 2021-04-14 DIAGNOSIS — E669 Obesity, unspecified: Secondary | ICD-10-CM

## 2021-04-21 DIAGNOSIS — Z86010 Personal history of colonic polyps: Secondary | ICD-10-CM | POA: Diagnosis not present

## 2021-04-21 DIAGNOSIS — D120 Benign neoplasm of cecum: Secondary | ICD-10-CM | POA: Diagnosis not present

## 2021-05-01 NOTE — Progress Notes (Signed)
Margaret [name] [place] [address] Margaret [name] Phone: [phone] Subjective:   IVilma [name], am serving as a scribe for Dr. Hulan Saas. This visit occurred during the SARS-CoV-2 public health emergency.  Safety protocols were in place, including screening questions prior to the visit, additional usage of staff PPE, and extensive cleaning of exam room while observing appropriate contact time as indicated for disinfecting solutions.   I'm seeing this patient by the request  of:  Margaret Koch, MD  CC: Hip pain follow-up  [id number]  03/03/2021 Patient given injection today.  Tolerated the procedure well.  MRI only showed some intersubstance tearing noted.  Ultrasound is still consistent with this.  Hopefully this does help with some of the pain.  Discussed icing regimen.  Discussed differential includes lumbar radiculopathy.  Follow-up with me again in 6 to 8 weeks.  Update 05/04/2021 Margaret [name] is a 66 y.o. female coming in with complaint of R hip pain. Patient states hip doing well. Cutting back on daily steps has helped with the pain. No pain. If any pain does occur less intense and goes away. No new complaints. Patient is wanting to lose weight and be more active.      Past Medical History:  Diagnosis Date   Arthritis    Endometrial cancer ([place])    Exercise-induced asthma    Heart murmur    History of bradycardia 2011   had an episode of HR 38 at time of blood work w/ palpitations and SOB,  work-up done by cardiology (dr dalton Aundra Dubin) stress echo normal on 01/ 2012 and (12/ 2011) holter monitor with PACs with average HR 68/  07-14-2017 per pt no issues since then   Hx of colonic polyps    x3   IBS (irritable bowel syndrome)    OSA (obstructive sleep apnea) 07-14-2017 per had a new study on 07-12-2017 have not heard about results yet   per study 01-06-2005 moderate OSA (AHI 23.3/hr) used cpap, per pt lost weight withthe  resolution of symptoms but has gain weight back   Type 2 diabetes mellitus ([place])    Wears glasses    Past Surgical History:  Procedure Laterality Date   colonoscopy with polypectomy  2012 approx.   DOBUTAMINE STRESS ECHO  04/2010   normal with no evidence of ischemia or infarction   LAPAROSCOPIC CHOLECYSTECTOMY  1996   PLANTAR FASCIA RELEASE     ROBOTIC ASSISTED SUPRACERVICAL HYSTERECTOMY WITH BILATERAL SALPINGO OOPHERECTOMY N/A 07/19/2017   Procedure: XI ROBOTIC ASSISTED  HYSTERECTOMY WITH BILATERAL SALPINGO OOPHORECTOMY; SENTINEL LYMPH NODE BIOPSY;  Surgeon: [name] Amber, MD;  Location: [facility];  Service: Gynecology;  Laterality: N/A;   SENTINEL NODE BIOPSY N/A 07/19/2017   Procedure: SENTINEL NODE BIOPSY;  Surgeon: [name] Amber, MD;  Location: [facility];  Service: Gynecology;  Laterality: N/A;   TRANSTHORACIC ECHOCARDIOGRAM  04/17/2010   ef 55%/  trivial AR and TR/ left LAE/     Social History   Socioeconomic History   Marital status: Divorced    Spouse name: Not on file   Number of children: Not on file   Years of education: Not on file   Highest education level: Not on file  Occupational History   Occupation: Administrator    Employer: [employer]  Tobacco Use   Smoking status: Former    Types: Cigarettes   Smokeless tobacco: Never   Tobacco comments:    07-14-2017 per  pt Smoked [age] ONLY as teen  Vaping Use   Vaping Use: Never used  Substance and Sexual Activity   Alcohol use: Yes    Comment: rarely   Drug use: No   Sexual activity: Not on file  Other Topics Concern   Not on file  Social History Narrative   Not on file   Social Determinants of Health   Financial Resource Strain: Not on file  Food Insecurity: Not on file  Transportation Needs: Not on file  Physical Activity: Not on file  Stress: Not on file  Social Connections: Not on file   Allergies  Allergen Reactions   Amoxicillin Hives   Other Anaphylaxis    TREE NUTS   Penicillins Hives  and Rash    Hives Has patient had a PCN reaction causing immediate rash, facial/tongue/throat swelling, SOB or lightheadedness with hypotension: No Has patient had a PCN reaction causing severe rash involving mucus membranes or skin necrosis: No Has patient had a PCN reaction that required hospitalization: No Has patient had a PCN reaction occurring within the last 10 years: No If all of the above answers are "NO", then may proceed with Cephalosporin use.  Hives Has patient had a PCN reaction causing immediate rash, facial/tongue/throat swelling, SOB or lightheadedness with hypotension: No Has patient had a PCN reaction causing severe rash involving mucus membranes or skin necrosis: No Has patient had a PCN reaction that required hospitalization: No Has patient had a PCN reaction occurring within the last 10 years: No If all of the above answers are "NO", then may proceed with Cephalosporin use. Hives Has patient had a PCN reaction causing immediate rash, facial/tongue/throat swelling, SOB or lightheadedness with hypotension: No Has patient had a PCN reaction causing severe rash involving mucus membranes or skin necrosis: No Has patient had a PCN reaction that required hospitalization: No Has patient had a PCN reaction occurring within the last 10 years: No If all of the above answers are "NO", then may proceed with Cephalosporin use.   Macrodantin  [Nitrofurantoin Macrocrystal] Rash   Paroxetine Other (See Comments)    Headache , malaise   Family History  Problem Relation Age of Onset   Colon polyps Father    COPD Father    Diabetes Father        diet controlled   Heart attack Mother 63   COPD Mother    Lung cancer Mother    Peripheral vascular disease Mother    Hypertension Brother        2 bro   Stroke Neg Hx     Current Outpatient Medications (Endocrine & Metabolic):    metFORMIN (GLUCOPHAGE-XR) 500 MG 24 hr tablet, TAKE 2 TABLETS BY MOUTH TWICE A DAY  Current Outpatient  Medications (Cardiovascular):    EPINEPHrine 0.3 mg/0.3 mL IJ SOAJ injection, Inject 0.3 mg into the muscle as needed for anaphylaxis.   lisinopril (ZESTRIL) 2.5 MG tablet, TAKE 1 TABLET BY MOUTH EVERY DAY   pravastatin (PRAVACHOL) 40 MG tablet, TAKE 1 TABLET BY MOUTH EVERY DAY  Current Outpatient Medications (Respiratory):    benzonatate (TESSALON) 100 MG capsule, Take 1 capsule (100 mg total) by mouth 2 (two) times daily as needed for cough.   Current Outpatient Medications (Hematological):    vitamin B-12 (CYANOCOBALAMIN) 500 MCG tablet, Take 500 mcg by mouth daily.  Current Outpatient Medications (Other):    BIOTIN PO, Take by mouth daily.   Melatonin 5 MG CHEW, Chew 3 each by mouth as needed.  Omega-3 Fatty Acids (FISH OIL ADULT GUMMIES PO), Take by mouth daily.   Objective  Blood pressure 110/74, pulse 86, height 5\' 4"  (1.626 m), weight 201 lb (91.2 kg), SpO2 95 %.   General: No apparent distress alert and oriented x3 mood and affect normal, dressed appropriately.  HEENT: Pupils equal, extraocular movements intact  Respiratory: Patient's speak in full sentences and does not appear short of breath  Cardiovascular: No lower extremity edema, non tender, no erythema  Gait normal with good balance and coordination.  MSK: Very mild tenderness to palpation over the greater trochanteric area but patient is sitting comfortably in her chair.    Impression and Recommendations:     The above documentation has been reviewed and is accurate and complete Lyndal Pulley, DO

## 2021-05-04 ENCOUNTER — Ambulatory Visit: Payer: Self-pay

## 2021-05-04 ENCOUNTER — Ambulatory Visit (INDEPENDENT_AMBULATORY_CARE_PROVIDER_SITE_OTHER): Payer: BC Managed Care – PPO | Admitting: Internal Medicine

## 2021-05-04 ENCOUNTER — Ambulatory Visit (INDEPENDENT_AMBULATORY_CARE_PROVIDER_SITE_OTHER): Payer: BC Managed Care – PPO | Admitting: Family Medicine

## 2021-05-04 ENCOUNTER — Encounter: Payer: Self-pay | Admitting: Internal Medicine

## 2021-05-04 ENCOUNTER — Other Ambulatory Visit: Payer: Self-pay

## 2021-05-04 VITALS — BP 122/78 | HR 77 | Resp 18 | Ht 64.0 in | Wt 200.6 lb

## 2021-05-04 VITALS — BP 110/74 | HR 86 | Ht 64.0 in | Wt 201.0 lb

## 2021-05-04 DIAGNOSIS — E1169 Type 2 diabetes mellitus with other specified complication: Secondary | ICD-10-CM

## 2021-05-04 DIAGNOSIS — M7061 Trochanteric bursitis, right hip: Secondary | ICD-10-CM | POA: Diagnosis not present

## 2021-05-04 DIAGNOSIS — S76011A Strain of muscle, fascia and tendon of right hip, initial encounter: Secondary | ICD-10-CM

## 2021-05-04 DIAGNOSIS — E669 Obesity, unspecified: Secondary | ICD-10-CM

## 2021-05-04 DIAGNOSIS — E785 Hyperlipidemia, unspecified: Secondary | ICD-10-CM

## 2021-05-04 DIAGNOSIS — Z0000 Encounter for general adult medical examination without abnormal findings: Secondary | ICD-10-CM | POA: Diagnosis not present

## 2021-05-04 LAB — LIPID PANEL
Cholesterol: 187 mg/dL (ref 0–200)
HDL: 73.6 mg/dL (ref >39.00)
LDL Cholesterol: 79 mg/dL (ref 0–99)
NonHDL: 113.43
Total CHOL/HDL Ratio: 3
Triglycerides: 173 mg/dL — ABNORMAL HIGH (ref 0.0–149.0)
VLDL: 34.6 mg/dL (ref 0.0–40.0)

## 2021-05-04 LAB — COMPREHENSIVE METABOLIC PANEL
ALT: 17 U/L (ref 0–35)
AST: 18 U/L (ref 0–37)
Albumin: 4.1 g/dL (ref 3.5–5.2)
Alkaline Phosphatase: 62 U/L (ref 39–117)
BUN: 24 mg/dL — ABNORMAL HIGH (ref 6–23)
CO2: 28 mEq/L (ref 19–32)
Calcium: 9.3 mg/dL (ref 8.4–10.5)
Chloride: 103 mEq/L (ref 96–112)
Creatinine, Ser: 0.8 mg/dL (ref 0.40–1.20)
GFR: 78.42 mL/min (ref >60.00)
Glucose, Bld: 130 mg/dL — ABNORMAL HIGH (ref 70–99)
Potassium: 4.8 mEq/L (ref 3.5–5.1)
Sodium: 139 mEq/L (ref 135–145)
Total Bilirubin: 0.3 mg/dL (ref 0.2–1.2)
Total Protein: 6.7 g/dL (ref 6.0–8.3)

## 2021-05-04 LAB — MICROALBUMIN / CREATININE URINE RATIO
Creatinine,U: 69.6 mg/dL
Microalb Creat Ratio: 1 mg/g (ref 0.0–30.0)
Microalb, Ur: <0.7 mg/dL (ref 0.0–1.9)

## 2021-05-04 LAB — HEMOGLOBIN A1C: Hgb A1c MFr Bld: 6.9 % — ABNORMAL HIGH (ref 4.6–6.5)

## 2021-05-04 NOTE — Assessment & Plan Note (Signed)
Patient is doing well overall.  No significant changes at this moment.  With me again in 2 to 3 months.  Worsening pain can consider another injection if needed.

## 2021-05-04 NOTE — Patient Instructions (Signed)
Do prescribed exercises at least 3x a week Weight lifting gloves See you again in 2 months

## 2021-05-04 NOTE — Assessment & Plan Note (Signed)
Checking HgA1c, microalbumin to creatinine ratio and lipid panel. Adjust lisinopril 2.5 mg daily and metformin 1000 mg daily and pravastatin 40 mg daily as needed. Foot exam done and eye exam yearly and up to date.

## 2021-05-04 NOTE — Assessment & Plan Note (Signed)
Flu shot up to date. Covid-19 up to date. Pneumonia up to date until 80. Shingrix complete. Tetanus up to date. Colonoscopy done recently due in 3 years. Mammogram up to date, pap smear up to date. Counseled about sun safety and mole surveillance. Counseled about the dangers of distracted driving. Given 10 year screening recommendations.

## 2021-05-04 NOTE — Assessment & Plan Note (Signed)
Also seems to be doing well.  He needs to continue to work on muscle strength.  Discussed if worsening pain we do need to consider further evaluation but I do think patient is going to do just fine.

## 2021-05-04 NOTE — Assessment & Plan Note (Signed)
Checking lipid panel and adjust pravastatin 40 mg daily as needed. Goal LDL <100.

## 2021-05-04 NOTE — Progress Notes (Signed)
° °  Subjective:   Patient ID: Margaret [name], female    DOB: 1955/06/02, 66 y.o.   MRN: [id number]  HPI The patient is a 66 YO female coming in for physical.   PMH, [place], social history reviewed and updated  Review of Systems  Constitutional: Negative.   HENT: Negative.    Eyes: Negative.   Respiratory:  Negative for cough, chest tightness and shortness of breath.   Cardiovascular:  Negative for chest pain, palpitations and leg swelling.  Gastrointestinal:  Negative for abdominal distention, abdominal pain, constipation, diarrhea, nausea and vomiting.  Musculoskeletal:  Positive for arthralgias.  Skin: Negative.   Neurological: Negative.   Psychiatric/Behavioral: Negative.     Objective:  Physical Exam Constitutional:      Appearance: She is well-developed. She is obese.  HENT:     Head: Normocephalic and atraumatic.  Cardiovascular:     Rate and Rhythm: Normal rate and regular rhythm.  Pulmonary:     Effort: Pulmonary effort is normal. No respiratory distress.     Breath sounds: Normal breath sounds. No wheezing or rales.  Abdominal:     General: Bowel sounds are normal. There is no distension.     Palpations: Abdomen is soft.     Tenderness: There is no abdominal tenderness. There is no rebound.  Musculoskeletal:     Cervical back: Normal range of motion.  Skin:    General: Skin is warm and dry.     Comments: Foot exam done  Neurological:     Mental Status: She is alert and oriented to person, place, and time.     Coordination: Coordination normal.    Vitals:   05/04/21 0854  BP: 122/78  Pulse: 77  Resp: 18  SpO2: 97%  Weight: 200 lb 9.6 oz (91 kg)  Height: 5\' 4"  (1.626 m)    This visit occurred during the SARS-CoV-2 public health emergency.  Safety protocols were in place, including screening questions prior to the visit, additional usage of staff PPE, and extensive cleaning of exam room while observing appropriate contact time as indicated for disinfecting  solutions.   Assessment & Plan:

## 2021-06-08 DIAGNOSIS — L905 Scar conditions and fibrosis of skin: Secondary | ICD-10-CM | POA: Diagnosis not present

## 2021-06-08 DIAGNOSIS — L570 Actinic keratosis: Secondary | ICD-10-CM | POA: Diagnosis not present

## 2021-06-08 DIAGNOSIS — L814 Other melanin hyperpigmentation: Secondary | ICD-10-CM | POA: Diagnosis not present

## 2021-06-08 DIAGNOSIS — D225 Melanocytic nevi of trunk: Secondary | ICD-10-CM | POA: Diagnosis not present

## 2021-06-08 DIAGNOSIS — L821 Other seborrheic keratosis: Secondary | ICD-10-CM | POA: Diagnosis not present

## 2021-06-25 NOTE — Progress Notes (Signed)
Margaret [name] [address] [place] Phone: [phone] Subjective:   Margaret [name], am serving as a scribe for Dr. Hulan Saas. This visit occurred during the SARS-CoV-2 public health emergency.  Safety protocols were in place, including screening questions prior to the visit, additional usage of staff PPE, and extensive cleaning of exam room while observing appropriate contact time as indicated for disinfecting solutions.   I'm seeing this patient by the request  of:  Hoyt Koch, MD  CC: Right hip pain  [id number]  05/04/2021 Also seems to be doing well.  He needs to continue to work on muscle strength.  Discussed if worsening pain we do need to consider further evaluation but I do think patient is going to do just fine.  Patient is doing well overall.  No significant changes at this moment.  With me again in 2 to 3 months.  Worsening pain can consider another injection if needed.  Update 06/29/2021 Margaret [name] is a 66 y.o. female coming in with complaint of R hip pain. Patient states doing well. Haven't had a lot of problems with it. Right ankle a little sore. Not stopping her from doing activity. Whole right leg seems out of sorts, but has been walking miles and feels sore after. No other complaints.    Past Medical History:  Diagnosis Date   Arthritis    Endometrial cancer ([place])    Exercise-induced asthma    Heart murmur    History of bradycardia 2011   had an episode of HR 38 at time of blood work w/ palpitations and SOB,  work-up done by cardiology (dr dalton Aundra Dubin) stress echo normal on 01/ 2012 and (12/ 2011) holter monitor with PACs with average HR 68/  07-14-2017 per pt no issues since then   Hx of colonic polyps    x3   IBS (irritable bowel syndrome)    OSA (obstructive sleep apnea) 07-14-2017 per had a new study on 07-12-2017 have not heard about results yet   per study 01-06-2005 moderate OSA (AHI  23.3/hr) used cpap, per pt lost weight withthe resolution of symptoms but has gain weight back   Type 2 diabetes mellitus ([place])    Wears glasses    Past Surgical History:  Procedure Laterality Date   colonoscopy with polypectomy  2012 approx.   DOBUTAMINE STRESS ECHO  04/2010   normal with no evidence of ischemia or infarction   LAPAROSCOPIC CHOLECYSTECTOMY  1996   PLANTAR FASCIA RELEASE     ROBOTIC ASSISTED SUPRACERVICAL HYSTERECTOMY WITH BILATERAL SALPINGO OOPHERECTOMY N/A 07/19/2017   Procedure: XI ROBOTIC ASSISTED  HYSTERECTOMY WITH BILATERAL SALPINGO OOPHORECTOMY; SENTINEL LYMPH NODE BIOPSY;  Surgeon: Everitt Amber, MD;  Location: [facility];  Service: Gynecology;  Laterality: N/A;   SENTINEL NODE BIOPSY N/A 07/19/2017   Procedure: SENTINEL NODE BIOPSY;  Surgeon: Everitt Amber, MD;  Location: [facility];  Service: Gynecology;  Laterality: N/A;   TRANSTHORACIC ECHOCARDIOGRAM  04/17/2010   ef 55%/  trivial AR and TR/ left LAE/     Social History   Socioeconomic History   Marital status: Divorced    Spouse name: Not on file   Number of children: Not on file   Years of education: Not on file   Highest education level: Not on file  Occupational History   Occupation: Administrator    Employer: [employer]  Tobacco Use   Smoking status: Former  Types: Cigarettes   Smokeless tobacco: Never   Tobacco comments:    07-14-2017 per pt Smoked 18-19 ONLY as teen  Vaping Use   Vaping Use: Never used  Substance and Sexual Activity   Alcohol use: Yes    Comment: rarely   Drug use: No   Sexual activity: Not on file  Other Topics Concern   Not on file  Social History Narrative   Not on file   Social Determinants of Health   Financial Resource Strain: Not on file  Food Insecurity: Not on file  Transportation Needs: Not on file  Physical Activity: Not on file  Stress: Not on file  Social Connections: Not on file   Allergies  Allergen Reactions   Amoxicillin Hives   Other  Anaphylaxis    TREE NUTS   Penicillins Hives and Rash    Hives Has patient had a PCN reaction causing immediate rash, facial/tongue/throat swelling, SOB or lightheadedness with hypotension: No Has patient had a PCN reaction causing severe rash involving mucus membranes or skin necrosis: No Has patient had a PCN reaction that required hospitalization: No Has patient had a PCN reaction occurring within the last 10 years: No If all of the above answers are "NO", then may proceed with Cephalosporin use.  Hives Has patient had a PCN reaction causing immediate rash, facial/tongue/throat swelling, SOB or lightheadedness with hypotension: No Has patient had a PCN reaction causing severe rash involving mucus membranes or skin necrosis: No Has patient had a PCN reaction that required hospitalization: No Has patient had a PCN reaction occurring within the last 10 years: No If all of the above answers are "NO", then may proceed with Cephalosporin use. Hives Has patient had a PCN reaction causing immediate rash, facial/tongue/throat swelling, SOB or lightheadedness with hypotension: No Has patient had a PCN reaction causing severe rash involving mucus membranes or skin necrosis: No Has patient had a PCN reaction that required hospitalization: No Has patient had a PCN reaction occurring within the last 10 years: No If all of the above answers are "NO", then may proceed with Cephalosporin use.   Macrodantin  [Nitrofurantoin Macrocrystal] Rash   Paroxetine Other (See Comments)    Headache , malaise   Family History  Problem Relation Age of Onset   Colon polyps Father    COPD Father    Diabetes Father        diet controlled   Heart attack Mother 33   COPD Mother    Lung cancer Mother    Peripheral vascular disease Mother    Hypertension Brother        2 bro   Stroke Neg Hx     Current Outpatient Medications (Endocrine & Metabolic):    metFORMIN (GLUCOPHAGE-XR) 500 MG 24 hr tablet, TAKE 2  TABLETS BY MOUTH TWICE A DAY  Current Outpatient Medications (Cardiovascular):    EPINEPHrine 0.3 mg/0.3 mL IJ SOAJ injection, Inject 0.3 mg into the muscle as needed for anaphylaxis.   lisinopril (ZESTRIL) 2.5 MG tablet, TAKE 1 TABLET BY MOUTH EVERY DAY   pravastatin (PRAVACHOL) 40 MG tablet, TAKE 1 TABLET BY MOUTH EVERY DAY  Current Outpatient Medications (Respiratory):    benzonatate (TESSALON) 100 MG capsule, Take 1 capsule (100 mg total) by mouth 2 (two) times daily as needed for cough.   Current Outpatient Medications (Hematological):    vitamin B-12 (CYANOCOBALAMIN) 500 MCG tablet, Take 500 mcg by mouth daily.  Current Outpatient Medications (Other):    BIOTIN PO, Take  by mouth daily.   Melatonin 5 MG CHEW, Chew 3 each by mouth as needed.   Omega-3 Fatty Acids (FISH OIL ADULT GUMMIES PO), Take by mouth daily.   Reviewed prior external information including notes and imaging from  primary care provider As well as notes that were available from care everywhere and other healthcare systems.  Past medical history, social, surgical and family history all reviewed in electronic medical record.  No pertanent information unless stated regarding to the chief complaint.   Review of Systems:  No headache, visual changes, nausea, vomiting, diarrhea, constipation, dizziness, abdominal pain, skin rash, fevers, chills, night sweats, weight loss, swollen lymph nodes, body aches, joint swelling, chest pain, shortness of breath, mood changes. POSITIVE muscle aches  Objective  Blood pressure 112/74, pulse 82, height '5\' 4"'$  (1.626 m), weight 201 lb (91.2 kg), SpO2 96 %.   General: No apparent distress alert and oriented x3 mood and affect normal, dressed appropriately.  HEENT: Pupils equal, extraocular movements intact  Respiratory: Patient's speak in full sentences and does not appear short of breath  Cardiovascular: No lower extremity edema, non tender, no erythema  Gait mild antalgic MSK:  Right hip does have severe tenderness to palpation over the greater trochanteric area.  The patient does have some tenderness over the gluteal area as well.  Negative straight leg test The patient does have some mild loss of lordosis.   Procedure: Real-time Ultrasound Guided Injection of right greater trochanteric bursitis secondary to patient's body habitus Device: GE Logiq Q7 Ultrasound guided injection is preferred based studies that show increased duration, increased effect, greater accuracy, decreased procedural pain, increased response rate, and decreased cost with ultrasound guided versus blind injection.  Verbal informed consent obtained.  Time-out conducted.  Noted no overlying erythema, induration, or other signs of local infection.  Skin prepped in a sterile fashion.  Local anesthesia: Topical Ethyl chloride.  With sterile technique and under real time ultrasound guidance:  Greater trochanteric area was visualized and patient's bursa was noted. A 22-gauge 3 inch needle was inserted and 4 cc of 0.5% Marcaine and 1 cc of Kenalog 40 mg/dL was injected. Pictures taken Completed without difficulty  Pain immediately improved suggesting accurate placement of the medication.  Advised to call if fevers/chills, erythema, induration, drainage, or persistent bleeding.  Impression: Technically successful ultrasound guided injection.   Impression and Recommendations:    The above documentation has been reviewed and is accurate and complete Lyndal Pulley, DO

## 2021-06-29 ENCOUNTER — Encounter: Payer: Self-pay | Admitting: Family Medicine

## 2021-06-29 ENCOUNTER — Other Ambulatory Visit: Payer: Self-pay

## 2021-06-29 ENCOUNTER — Ambulatory Visit (INDEPENDENT_AMBULATORY_CARE_PROVIDER_SITE_OTHER): Payer: BC Managed Care – PPO

## 2021-06-29 ENCOUNTER — Ambulatory Visit: Payer: Self-pay

## 2021-06-29 ENCOUNTER — Ambulatory Visit (INDEPENDENT_AMBULATORY_CARE_PROVIDER_SITE_OTHER): Payer: BC Managed Care – PPO | Admitting: Family Medicine

## 2021-06-29 VITALS — BP 112/74 | HR 82 | Ht 64.0 in | Wt 201.0 lb

## 2021-06-29 DIAGNOSIS — M7061 Trochanteric bursitis, right hip: Secondary | ICD-10-CM

## 2021-06-29 DIAGNOSIS — S76011A Strain of muscle, fascia and tendon of right hip, initial encounter: Secondary | ICD-10-CM

## 2021-06-29 DIAGNOSIS — M1611 Unilateral primary osteoarthritis, right hip: Secondary | ICD-10-CM | POA: Diagnosis not present

## 2021-06-29 DIAGNOSIS — M47816 Spondylosis without myelopathy or radiculopathy, lumbar region: Secondary | ICD-10-CM | POA: Diagnosis not present

## 2021-06-29 NOTE — Assessment & Plan Note (Signed)
Given injection and tolerated the procedure well.  Discussed icing regimen and home exercises.  Differential includes lumbar radiculopathy.  With patient's history of endometrial neoplasm we will get x-rays to further evaluate for any other bony abnormalities that would be concerning.  Follow-up again in 6 to 8 weeks ?

## 2021-06-29 NOTE — Patient Instructions (Addendum)
Xrays today ?See you again in 6 weeks ?

## 2021-07-24 ENCOUNTER — Ambulatory Visit (HOSPITAL_COMMUNITY)
Admission: RE | Admit: 2021-07-24 | Discharge: 2021-07-24 | Disposition: A | Discharge status: Home / Self Care | Payer: BC Managed Care – PPO | Source: Ambulatory Visit | Attending: Cardiology | Admitting: Cardiology

## 2021-07-24 DIAGNOSIS — S76011A Strain of muscle, fascia and tendon of right hip, initial encounter: Secondary | ICD-10-CM | POA: Diagnosis not present

## 2021-08-12 ENCOUNTER — Encounter: Payer: Self-pay | Admitting: Internal Medicine

## 2021-08-12 NOTE — Progress Notes (Signed)
?Charlann Boxer D.O. ?[facility] ?[place] ?Phone: [phone] ?Subjective:   ?I, Jacqualin Combes, am serving as a scribe for Dr. Hulan Saas. ? ?This visit occurred during the SARS-CoV-2 public health emergency.  Safety protocols were in place, including screening questions prior to the visit, additional usage of staff PPE, and extensive cleaning of exam room while observing appropriate contact time as indicated for disinfecting solutions.  ? ? ?I'm seeing this patient by the request  of:  Hoyt Koch, MD ? ?CC: Hip pain follow-up ? ?GYI:[id number]  ?06/29/2021 ?Given injection and tolerated the procedure well.  Discussed icing regimen and home exercises.  Differential includes lumbar radiculopathy.  With patient's history of endometrial neoplasm we will get x-rays to further evaluate for any other bony abnormalities that would be concerning.  Follow-up again in 6 to 8 weeks ? ?Update 08/24/2021 ?MARIPOSA SHORES is a 66 y.o. female coming in with complaint of R hip and R glute pain. Patient states that she is doing better. Inclines do cause pain but it quickly subsides.  Patient has been able to walk 10 miles a day.  Patient tries to stay on flat surfaces. ? ?R hip 06/29/2021 ?IMPRESSION: ?1. Mild bilateral hip osteoarthritis. ?2. Mild enthesopathic change at the right greater trochanter. ?  ? ?Lumbar spine 06/29/2021 ?IMPRESSION: ?Degenerative change in the lumbar spine with facet hypertrophy and ?mild L1-L2 disc space narrowing. ? ?  ? ?Past Medical History:  ?Diagnosis Date  ? Arthritis   ? Endometrial cancer ([place])   ? Exercise-induced asthma   ? Heart murmur   ? History of bradycardia 2011  ? had an episode of HR 38 at time of blood work w/ palpitations and SOB,  work-up done by cardiology (dr dalton Aundra Dubin) stress echo normal on 01/ 2012 and (12/ 2011) holter monitor with PACs with average HR 68/  07-14-2017 per pt no issues since then  ? Hx of colonic polyps   ?  x3  ? IBS (irritable bowel syndrome)   ? OSA (obstructive sleep apnea) 07-14-2017 per had a new study on 07-12-2017 have not heard about results yet  ? per study 01-06-2005 moderate OSA (AHI 23.3/hr) used cpap, per pt lost weight withthe resolution of symptoms but has gain weight back  ? Type 2 diabetes mellitus ([place])   ? Wears glasses   ? ?Past Surgical History:  ?Procedure Laterality Date  ? colonoscopy with polypectomy  2012 approx.  ? DOBUTAMINE STRESS ECHO  04/2010  ? normal with no evidence of ischemia or infarction  ? [place]  ? PLANTAR FASCIA RELEASE    ? ROBOTIC ASSISTED SUPRACERVICAL HYSTERECTOMY WITH BILATERAL SALPINGO OOPHERECTOMY N/A 07/19/2017  ? Procedure: XI ROBOTIC ASSISTED  HYSTERECTOMY WITH BILATERAL SALPINGO OOPHORECTOMY; SENTINEL LYMPH NODE BIOPSY;  Surgeon: Everitt Amber, MD;  Location: [facility];  Service: Gynecology;  Laterality: N/A;  ? SENTINEL NODE BIOPSY N/A 07/19/2017  ? Procedure: SENTINEL NODE BIOPSY;  Surgeon: Everitt Amber, MD;  Location: [facility];  Service: Gynecology;  Laterality: N/A;  ? TRANSTHORACIC ECHOCARDIOGRAM  04/17/2010  ? ef 55%/  trivial AR and TR/ left LAE/    ? ?Social History  ? ?Socioeconomic History  ? Marital status: Divorced  ?  Spouse name: Not on file  ? Number of children: Not on file  ? Years of education: Not on file  ? Highest education level: Not on file  ?Occupational History  ? Occupation: Administrator  ?  Employer: [employer]  ?Tobacco Use  ? Smoking status: Former  ?  Types: Cigarettes  ? Smokeless tobacco: Never  ? Tobacco comments:  ?  07-14-2017 per pt Smoked [age] ONLY as teen  ?Vaping Use  ? Vaping Use: Never used  ?Substance and Sexual Activity  ? Alcohol use: Yes  ?  Comment: rarely  ? Drug use: No  ? Sexual activity: Not on file  ?Other Topics Concern  ? Not on file  ?Social History Narrative  ? Not on file  ? ?Social Determinants of Health  ? ?Financial Resource Strain: Not on file  ?Food Insecurity: Not on file   ?Transportation Needs: Not on file  ?Physical Activity: Not on file  ?Stress: Not on file  ?Social Connections: Not on file  ? ?Allergies  ?Allergen Reactions  ? Amoxicillin Hives  ? Other Anaphylaxis  ?  TREE NUTS  ? Penicillins Hives and Rash  ?  Hives ?Has patient had a PCN reaction causing immediate rash, facial/tongue/throat swelling, SOB or lightheadedness with hypotension: No ?Has patient had a PCN reaction causing severe rash involving mucus membranes or skin necrosis: No ?Has patient had a PCN reaction that required hospitalization: No ?Has patient had a PCN reaction occurring within the last 10 years: No ?If all of the above answers are "NO", then may proceed with Cephalosporin use. ? ?Hives ?Has patient had a PCN reaction causing immediate rash, facial/tongue/throat swelling, SOB or lightheadedness with hypotension: No ?Has patient had a PCN reaction causing severe rash involving mucus membranes or skin necrosis: No ?Has patient had a PCN reaction that required hospitalization: No ?Has patient had a PCN reaction occurring within the last 10 years: No ?If all of the above answers are "NO", then may proceed with Cephalosporin use. ?Hives ?Has patient had a PCN reaction causing immediate rash, facial/tongue/throat swelling, SOB or lightheadedness with hypotension: No ?Has patient had a PCN reaction causing severe rash involving mucus membranes or skin necrosis: No ?Has patient had a PCN reaction that required hospitalization: No ?Has patient had a PCN reaction occurring within the last 10 years: No ?If all of the above answers are "NO", then may proceed with Cephalosporin use.  ? Macrodantin  [Nitrofurantoin Macrocrystal] Rash  ? Paroxetine Other (See Comments)  ?  Headache , malaise  ? ?Family History  ?Problem Relation Age of Onset  ? Colon polyps Father   ? COPD Father   ? Diabetes Father   ?     diet controlled  ? Heart attack Mother 75  ? COPD Mother   ? Lung cancer Mother   ? Peripheral vascular  disease Mother   ? Hypertension Brother   ?     2 bro  ? Stroke Neg Hx   ? ? ?Current Outpatient Medications (Endocrine & Metabolic):  ?  metFORMIN (GLUCOPHAGE-XR) 500 MG 24 hr tablet, TAKE 2 TABLETS BY MOUTH TWICE A DAY ? ?Current Outpatient Medications (Cardiovascular):  ?  EPINEPHrine 0.3 mg/0.3 mL IJ SOAJ injection, Inject 0.3 mg into the muscle as needed for anaphylaxis. ?  lisinopril (ZESTRIL) 2.5 MG tablet, TAKE 1 TABLET BY MOUTH EVERY DAY ?  pravastatin (PRAVACHOL) 40 MG tablet, TAKE 1 TABLET BY MOUTH EVERY DAY ? ?Current Outpatient Medications (Respiratory):  ?  benzonatate (TESSALON) 100 MG capsule, Take 1 capsule (100 mg total) by mouth 2 (two) times daily as needed for cough. ? ? ?Current Outpatient Medications (Hematological):  ?  vitamin B-12 (CYANOCOBALAMIN) 500 MCG  tablet, Take 500 mcg by mouth daily. ? ?Current Outpatient Medications (Other):  ?  BIOTIN PO, Take by mouth daily. ?  Melatonin 5 MG CHEW, Chew 3 each by mouth as needed. ?  Omega-3 Fatty Acids (FISH OIL ADULT GUMMIES PO), Take by mouth daily. ? ? ? ?Review of Systems: ? No headache, visual changes, nausea, vomiting, diarrhea, constipation, dizziness, abdominal pain, skin rash, fevers, chills, night sweats, weight loss, swollen lymph nodes, body aches, joint swelling, chest pain, shortness of breath, mood changes. POSITIVE muscle aches but improvement ? ?Objective  ?Blood pressure 100/74, pulse 83, height '5\' 4"'$  (1.626 m), weight 201 lb (91.2 kg), SpO2 95 %. ?  ?General: No apparent distress alert and oriented x3 mood and affect normal, dressed appropriately.  ?HEENT: Pupils equal, extraocular movements intact  ?Respiratory: Patient's speak in full sentences and does not appear short of breath  ?Cardiovascular: No lower extremity edema, non tender, no erythema  ?Gait normal with good balance and coordination.  ?MSK: Tender to palpation over the greater trochanteric area laterally.  Sitting comfortably no with her legs crossed during the  whole exam ? ?  ?Impression and Recommendations:  ?  ?The above documentation has been reviewed and is accurate and complete Lyndal Pulley, DO ? ? ? ?

## 2021-08-24 ENCOUNTER — Ambulatory Visit (INDEPENDENT_AMBULATORY_CARE_PROVIDER_SITE_OTHER): Payer: BC Managed Care – PPO | Admitting: Family Medicine

## 2021-08-24 DIAGNOSIS — M7061 Trochanteric bursitis, right hip: Secondary | ICD-10-CM

## 2021-08-24 NOTE — Patient Instructions (Signed)
$'500mg'Y$  Choline daily ?Continue functional mushrooms ?Continue stretching ?See me in 3-4 months ?

## 2021-08-24 NOTE — Assessment & Plan Note (Signed)
Chronic problem but overall doing significantly better.  Patient is still working on the weight loss and encouraged her to continue to do so.  Patient has stalled a little bit but I do think patient is going to do well.  We discussed icing regimen and home exercises, we discussed which activities to do and which ones to avoid.  Discussed with patient worsening pain when to seek for another possible injection.  Patient will start swimming.  Spent  31 minutes with patient today as well as reviewing previous notes and imaging. ?

## 2021-09-18 ENCOUNTER — Other Ambulatory Visit: Payer: Self-pay | Admitting: Internal Medicine

## 2021-09-25 ENCOUNTER — Ambulatory Visit: Payer: BC Managed Care – PPO | Admitting: Gynecologic Oncology

## 2021-09-25 ENCOUNTER — Telehealth: Payer: Self-pay | Admitting: *Deleted

## 2021-09-25 DIAGNOSIS — C541 Malignant neoplasm of endometrium: Secondary | ICD-10-CM

## 2021-09-25 NOTE — Telephone Encounter (Signed)
Per Dr Berline Lopes moved the patients appt from 6/9 to 6/14

## 2021-09-30 ENCOUNTER — Other Ambulatory Visit: Payer: Self-pay

## 2021-09-30 ENCOUNTER — Inpatient Hospital Stay: Payer: BC Managed Care – PPO | Attending: Gynecologic Oncology | Admitting: Obstetrics & Gynecology

## 2021-09-30 ENCOUNTER — Encounter: Payer: Self-pay | Admitting: Obstetrics & Gynecology

## 2021-09-30 VITALS — BP 131/74 | HR 73 | Temp 97.9°F | Resp 16 | Ht 64.0 in | Wt 202.0 lb

## 2021-09-30 DIAGNOSIS — Z8542 Personal history of malignant neoplasm of other parts of uterus: Secondary | ICD-10-CM

## 2021-09-30 DIAGNOSIS — C541 Malignant neoplasm of endometrium: Secondary | ICD-10-CM

## 2021-09-30 NOTE — Assessment & Plan Note (Signed)
Ms. SELAM PIETSCH is a 66 y.o. with stage IA grade 1 endometrioid endometrial adenocarcinoma (MSI stable/normal).  Pathology revealed low risk factors for recurrence, despite ITC's seen in a SLN, therefore no adjuvant therapy is recommended according to NCCN guidelines. Negative symptom review, normal exam.  No evidence of recurrence  >return prn or in 1 yr

## 2021-09-30 NOTE — Patient Instructions (Signed)
Return in 1 year ?

## 2021-09-30 NOTE — Progress Notes (Signed)
Follow Up Note: Gyn-Onc  Margaret [name] 66 y.o. female  CC: She presents for a f/u visit   HPI: The oncology history was reviewed.  Interval History: At the last visit exam findings were suggestive of a possible vaginal apex recurrence.  The histology from a bx was benign.   She denies any vaginal bleeding, abdominal/pelvic pain, cough, lethargy or increasing abdominal girth.   Review of Systems  Review of Systems  Constitutional:  Negative for malaise/fatigue and weight loss.  Respiratory:  Negative for shortness of breath and wheezing.   Cardiovascular:  Negative for chest pain and leg swelling.  Gastrointestinal:  Negative for abdominal pain, blood in stool, constipation, nausea and vomiting.  Genitourinary:  Negative for dysuria, frequency, hematuria and urgency.  Musculoskeletal:  Negative for joint pain and myalgias.  Neurological:  Negative for weakness.  Psychiatric/Behavioral:  Negative for depression. The patient does not have insomnia.    Current medications, allergy, social history, past surgical history, past medical history, family history were all reviewed.    Vitals:  BP 131/74 (BP Location: Left Arm, Patient Position: Sitting)   Pulse 73   Temp 97.9 F (36.6 C) (Tympanic)   Resp 16   Ht _0  (1.626 m)   Wt 202 lb (91.6 kg)   SpO2 97%   BMI 34.67 kg/m    Physical Exam:   Exam conducted with a chaperone present.  Constitutional:      General: She is not in acute distress. Cardiovascular:     Rate and Rhythm: Normal rate and regular rhythm.  Pulmonary:     Effort: Pulmonary effort is normal.     Breath sounds: Normal breath sounds. No wheezing or rhonchi.  Abdominal:     Palpations: Abdomen is soft.     Tenderness: There is no abdominal tenderness. There is no right CVA tenderness or left CVA tenderness.     Hernia: No hernia is present.  Genitourinary:    General: Normal vulva.     Urethra: No urethral lesion.     Vagina: No lesions. No  bleeding Musculoskeletal:     Cervical back: Neck supple.     Right lower leg: No edema.     Left lower leg: No edema.  Lymphadenopathy:     Upper Body:     Right upper body: No supraclavicular adenopathy.     Left upper body: No supraclavicular adenopathy.     Lower Body: No right inguinal adenopathy. No left inguinal adenopathy.  Skin:    Findings: No rash.  Neurological:     Mental Status: She is oriented to person, place, and time.   Assessment/Plan:   Malignant endometrial neoplasm with intraepithelial neoplasia [facility]) [name] is a 66 y.o. with stage IA grade 1 endometrioid endometrial adenocarcinoma (MSI stable/normal).   Pathology revealed low risk factors for recurrence, despite ITC's seen in a SLN, therefore no adjuvant therapy is recommended according to NCCN guidelines. Negative symptom review, normal exam.  No evidence of recurrence  >return prn or in 1 yr   Lahoma Crocker, MD

## 2021-10-15 ENCOUNTER — Other Ambulatory Visit: Payer: Self-pay | Admitting: Internal Medicine

## 2021-10-15 DIAGNOSIS — E669 Obesity, unspecified: Secondary | ICD-10-CM

## 2021-11-18 NOTE — Progress Notes (Signed)
[place] Phone: [phone] Subjective:   [name], am serving as a scribe for Dr. Hulan Saas.  I'm seeing this patient by the request  of:  Hoyt Koch, MD  CC: Pain all over  [id number]  08/24/2021 Chronic problem but overall doing significantly better.  Patient is still working on the weight loss and encouraged her to continue to do so.  Patient has stalled a little bit but I do think patient is going to do well.  We discussed icing regimen and home exercises, we discussed which activities to do and which ones to avoid.  Discussed with patient worsening pain when to seek for another possible injection.  Patient will start swimming.  Spent  31 minutes with patient today as well as reviewing previous notes and imaging.  Update 11/24/2021 ZARAH CARBON is a 66 y.o. female coming in with complaint of R hip and lower back pain. Patient states that hip is doing better. Has intermittent sharp pain in back of hip. Took a swimming class and had a back spasm. This has improved but wants to talk about preventative measures.   Overall has aches and pains in multiple joints. Unsure if symptoms are diet related or stress related as she mentions having a stressful summer.        Past Medical History:  Diagnosis Date   Arthritis    Endometrial cancer ([place])    Exercise-induced asthma    Heart murmur    History of bradycardia 2011   had an episode of HR 38 at time of blood work w/ palpitations and SOB,  work-up done by cardiology (dr dalton Aundra Dubin) stress echo normal on 01/ 2012 and (12/ 2011) holter monitor with PACs with average HR 68/  07-14-2017 per pt no issues since then   Hx of colonic polyps    x3   IBS (irritable bowel syndrome)    OSA (obstructive sleep apnea) 07-14-2017 per had a new study on 07-12-2017 have not heard about results yet   per study 01-06-2005 moderate OSA (AHI 23.3/hr) used  cpap, per pt lost weight withthe resolution of symptoms but has gain weight back   Type 2 diabetes mellitus ([place])    Wears glasses    Past Surgical History:  Procedure Laterality Date   colonoscopy with polypectomy  2012 approx.   DOBUTAMINE STRESS ECHO  04/2010   normal with no evidence of ischemia or infarction   LAPAROSCOPIC CHOLECYSTECTOMY  1996   PLANTAR FASCIA RELEASE     ROBOTIC ASSISTED SUPRACERVICAL HYSTERECTOMY WITH BILATERAL SALPINGO OOPHERECTOMY N/A 07/19/2017   Procedure: XI ROBOTIC ASSISTED  HYSTERECTOMY WITH BILATERAL SALPINGO OOPHORECTOMY; SENTINEL LYMPH NODE BIOPSY;  Surgeon: Everitt Amber, MD;  Location: [facility];  Service: Gynecology;  Laterality: N/A;   SENTINEL NODE BIOPSY N/A 07/19/2017   Procedure: SENTINEL NODE BIOPSY;  Surgeon: Everitt Amber, MD;  Location: [facility];  Service: Gynecology;  Laterality: N/A;   TRANSTHORACIC ECHOCARDIOGRAM  04/17/2010   ef 55%/  trivial AR and TR/ left LAE/     Social History   Socioeconomic History   Marital status: Divorced    Spouse name: Not on file   Number of children: Not on file   Years of education: Not on file   Highest education level: Not on file  Occupational History   Occupation: Administrator    Employer: [employer]  Tobacco Use   Smoking  status: Former    Types: Cigarettes   Smokeless tobacco: Never   Tobacco comments:    07-14-2017 per pt Smoked [age] ONLY as teen  Vaping Use   Vaping Use: Never used  Substance and Sexual Activity   Alcohol use: Yes    Comment: rarely   Drug use: No   Sexual activity: Not on file  Other Topics Concern   Not on file  Social History Narrative   Not on file   Social Determinants of Health   Financial Resource Strain: Not on file  Food Insecurity: Not on file  Transportation Needs: Not on file  Physical Activity: Not on file  Stress: Not on file  Social Connections: Not on file   Allergies  Allergen Reactions   Amoxicillin Hives   Other Anaphylaxis     TREE NUTS   Penicillins Hives and Rash    Hives Has patient had a PCN reaction causing immediate rash, facial/tongue/throat swelling, SOB or lightheadedness with hypotension: No Has patient had a PCN reaction causing severe rash involving mucus membranes or skin necrosis: No Has patient had a PCN reaction that required hospitalization: No Has patient had a PCN reaction occurring within the last 10 years: No If all of the above answers are "NO", then may proceed with Cephalosporin use.  Hives Has patient had a PCN reaction causing immediate rash, facial/tongue/throat swelling, SOB or lightheadedness with hypotension: No Has patient had a PCN reaction causing severe rash involving mucus membranes or skin necrosis: No Has patient had a PCN reaction that required hospitalization: No Has patient had a PCN reaction occurring within the last 10 years: No If all of the above answers are "NO", then may proceed with Cephalosporin use. Hives Has patient had a PCN reaction causing immediate rash, facial/tongue/throat swelling, SOB or lightheadedness with hypotension: No Has patient had a PCN reaction causing severe rash involving mucus membranes or skin necrosis: No Has patient had a PCN reaction that required hospitalization: No Has patient had a PCN reaction occurring within the last 10 years: No If all of the above answers are "NO", then may proceed with Cephalosporin use.   Macrodantin  [Nitrofurantoin Macrocrystal] Rash   Paroxetine Other (See Comments)    Headache , malaise   Family History  Problem Relation Age of Onset   Colon polyps Father    COPD Father    Diabetes Father        diet controlled   Heart attack Mother 6   COPD Mother    Lung cancer Mother    Peripheral vascular disease Mother    Hypertension Brother        2 bro   Stroke Neg Hx     Current Outpatient Medications (Endocrine & Metabolic):    metFORMIN (GLUCOPHAGE-XR) 500 MG 24 hr tablet, TAKE 2 TABLETS BY MOUTH  TWICE A DAY  Current Outpatient Medications (Cardiovascular):    EPINEPHrine 0.3 mg/0.3 mL IJ SOAJ injection, Inject 0.3 mg into the muscle as needed for anaphylaxis.   lisinopril (ZESTRIL) 2.5 MG tablet, TAKE 1 TABLET BY MOUTH EVERY DAY   pravastatin (PRAVACHOL) 40 MG tablet, TAKE 1 TABLET BY MOUTH EVERY DAY    Current Outpatient Medications (Hematological):    vitamin B-12 (CYANOCOBALAMIN) 500 MCG tablet, Take 500 mcg by mouth daily.  Current Outpatient Medications (Other):    tiZANidine (ZANAFLEX) 2 MG tablet, Take 1 tablet (2 mg total) by mouth at bedtime.   BIOTIN PO, Take by mouth daily.   Melatonin  5 MG CHEW, Chew 3 each by mouth as needed.   Omega-3 Fatty Acids (FISH OIL ADULT GUMMIES PO), Take by mouth daily.   Reviewed prior external information including notes and imaging from  primary care provider As well as notes that were available from care everywhere and other healthcare systems.  This includes patient's most recent laboratory work-up in January of this year  Past medical history, social, surgical and family history all reviewed in electronic medical record.  No pertanent information unless stated regarding to the chief complaint.   Review of Systems:  No headache, visual changes, nausea, vomiting, diarrhea, constipation, dizziness, abdominal pain, skin rash, fevers, chills, night sweats, weight loss, swollen lymph nodes, joint swelling, chest pain, shortness of breath, mood changes. POSITIVE muscle aches, body aches  Objective  Blood pressure 98/70, pulse 84, height '5\' 4"'$  (1.626 m), weight 198 lb (89.8 kg), SpO2 97 %.   General: No apparent distress alert and oriented x3 mood and affect normal, dressed appropriately.  HEENT: Pupils equal, extraocular movements intact  Respiratory: Patient's speak in full sentences and does not appear short of breath  Cardiovascular: No lower extremity edema, non tender, no erythema  Lumbar exam does have some loss of lordosis.   Some tenderness to palpation in the paraspinal musculature.  No midline tenderness.    Impression and Recommendations:    The above documentation has been reviewed and is accurate and complete Lyndal Pulley, DO

## 2021-11-24 ENCOUNTER — Ambulatory Visit: Payer: Self-pay

## 2021-11-24 ENCOUNTER — Ambulatory Visit (INDEPENDENT_AMBULATORY_CARE_PROVIDER_SITE_OTHER): Payer: BC Managed Care – PPO | Admitting: Family Medicine

## 2021-11-24 VITALS — BP 98/70 | HR 84 | Ht 64.0 in | Wt 198.0 lb

## 2021-11-24 DIAGNOSIS — M25551 Pain in right hip: Secondary | ICD-10-CM

## 2021-11-24 DIAGNOSIS — M7061 Trochanteric bursitis, right hip: Secondary | ICD-10-CM | POA: Diagnosis not present

## 2021-11-24 DIAGNOSIS — M545 Low back pain, unspecified: Secondary | ICD-10-CM

## 2021-11-24 MED ORDER — TIZANIDINE HCL 2 MG PO TABS: 2.0000 mg | ORAL_TABLET | Freq: Every day | ORAL | 0 refills | Status: DC

## 2021-11-24 NOTE — Assessment & Plan Note (Addendum)
Worsening pain again.  Patient has responded well to injections previously.  Patient though has had more spasms in the back.  He can have further evaluated.  Discussed with patient at great length and due to this exacerbation given muscle relaxers to take at night as well as referred to formal physical therapy.  Depending on any weakness patient should see [place] sooner.  Follow-up with me again in 6 to 8 weeks total time reviewing patient's chart and discussing different treatment options with patient 36 minutes

## 2021-11-24 NOTE — Patient Instructions (Signed)
Aquatic PT Zanaflex '2mg'$  at night for 3 nights in a row as needed Exercises Do not eat carbs alone See me again in 2-3 months

## 2021-12-15 ENCOUNTER — Other Ambulatory Visit: Payer: Self-pay | Admitting: Internal Medicine

## 2021-12-17 ENCOUNTER — Encounter: Payer: Self-pay | Admitting: Internal Medicine

## 2021-12-18 ENCOUNTER — Other Ambulatory Visit: Payer: Self-pay | Admitting: Family Medicine

## 2021-12-18 MED ORDER — PRAVASTATIN SODIUM 40 MG PO TABS: 40.0000 mg | ORAL_TABLET | Freq: Every day | ORAL | 0 refills | Status: DC

## 2022-01-04 ENCOUNTER — Ambulatory Visit (HOSPITAL_BASED_OUTPATIENT_CLINIC_OR_DEPARTMENT_OTHER): Payer: BC Managed Care – PPO | Attending: Family Medicine | Admitting: Physical Therapy

## 2022-01-04 ENCOUNTER — Encounter (HOSPITAL_BASED_OUTPATIENT_CLINIC_OR_DEPARTMENT_OTHER): Payer: Self-pay | Admitting: Physical Therapy

## 2022-01-04 DIAGNOSIS — M25551 Pain in right hip: Secondary | ICD-10-CM

## 2022-01-04 DIAGNOSIS — M545 Low back pain, unspecified: Secondary | ICD-10-CM | POA: Insufficient documentation

## 2022-01-04 DIAGNOSIS — M5459 Other low back pain: Secondary | ICD-10-CM

## 2022-01-04 DIAGNOSIS — M6281 Muscle weakness (generalized): Secondary | ICD-10-CM

## 2022-01-04 NOTE — Therapy (Signed)
OUTPATIENT PHYSICAL THERAPY THORACOLUMBAR EVALUATION   Patient Name: Margaret [name] MRN: [id number] DOB:[birth year], 66 y.o., female Today's Date: 01/04/2022   PT End of Session - 01/04/22 1745     Visit Number 1    Number of Visits 16    Date for PT Re-Evaluation 04/04/22    Authorization Type BCBS    PT Start Time 1645    PT Stop Time 1730    PT Time Calculation (min) 45 min    Activity Tolerance Patient tolerated treatment well    Behavior During Therapy [facility] for tasks assessed/performed             Past Medical History:  Diagnosis Date   Arthritis    Endometrial cancer ([place])    Exercise-induced asthma    Heart murmur    History of bradycardia 2011   had an episode of HR 38 at time of blood work w/ palpitations and SOB,  work-up done by cardiology (dr dalton Aundra Dubin) stress echo normal on 01/ 2012 and (12/ 2011) holter monitor with PACs with average HR 68/  07-14-2017 per pt no issues since then   Hx of colonic polyps    x3   IBS (irritable bowel syndrome)    OSA (obstructive sleep apnea) 07-14-2017 per had a new study on 07-12-2017 have not heard about results yet   per study 01-06-2005 moderate OSA (AHI 23.3/hr) used cpap, per pt lost weight withthe resolution of symptoms but has gain weight back   Type 2 diabetes mellitus ([place])    Wears glasses    Past Surgical History:  Procedure Laterality Date   colonoscopy with polypectomy  2012 approx.   DOBUTAMINE STRESS ECHO  04/2010   normal with no evidence of ischemia or infarction   LAPAROSCOPIC CHOLECYSTECTOMY  1996   PLANTAR FASCIA RELEASE     ROBOTIC ASSISTED SUPRACERVICAL HYSTERECTOMY WITH BILATERAL SALPINGO OOPHERECTOMY N/A 07/19/2017   Procedure: XI ROBOTIC ASSISTED  HYSTERECTOMY WITH BILATERAL SALPINGO OOPHORECTOMY; SENTINEL LYMPH NODE BIOPSY;  Surgeon: Everitt Amber, MD;  Location: [facility];  Service: Gynecology;  Laterality: N/A;   SENTINEL NODE BIOPSY N/A 07/19/2017   Procedure: SENTINEL NODE BIOPSY;  Surgeon:  Everitt Amber, MD;  Location: [facility];  Service: Gynecology;  Laterality: N/A;   TRANSTHORACIC ECHOCARDIOGRAM  04/17/2010   ef 55%/  trivial AR and TR/ left LAE/     Patient Active Problem List   Diagnosis Date Noted   Hyperlipidemia associated with type 2 diabetes mellitus ([place]) 04/30/2019   Greater trochanteric bursitis of right hip 04/24/2018   Malignant endometrial neoplasm with intraepithelial neoplasia ([place]) 07/19/2017   Tear of gluteus minimus tendon, right, initial encounter 02/22/2017   Routine general medical examination at a health care facility 10/11/2014   Sleep apnea 05/08/2012   CARDIAC MURMUR 03/30/2010   PAC (premature atrial contraction) 03/30/2010   Diabetes mellitus type 2 in obese ([place]) 02/01/2008   ELEVATED BLOOD PRESSURE WITHOUT DIAGNOSIS OF HYPERTENSION 02/01/2008    PCP: Hoyt Koch, MD  REFERRING PROVIDER: Lyndal Pulley, DO  REFERRING DIAG:  [phone] (ICD-10-CM) - Right hip pain  M54.50 (ICD-10-CM) - Low back pain, unspecified back pain laterality, unspecified chronicity, unspecified whether sciatica present    Rationale for Evaluation and Treatment Rehabilitation  THERAPY DIAG:  Other low back pain  Muscle weakness (generalized)  Pain in right hip  ONSET DATE: Summer 2023  SUBJECTIVE:  SUBJECTIVE STATEMENT: 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 She has only managed it conservatively with crutches at the time. The hip is a chronic issue. The L/S is what has recently flared.   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 Pt [facility] and sits all day which she believes the root cause. Pt states that the back, neck, and hip hurt usually by the end of the work week. She states that the back pain came  long after the hip pain. Pt states that the back feels more like spasming into the low back. She also has shooting pain into the neck. She will have NT into the R thigh, does not cross the knee.   Pt does some mild some resistance training and and yoga occassionally, She may only do it a few times a month. Pt has regular check ups for history of cancer/ had hysterectomy.  Pt denies other red flags.   Pt does note that she has increased pain with high stress works weeks.   PERTINENT HISTORY:  endometrial neoplasm 2019 surgery to remove  PAIN:  Are you having pain? Yes: NPRS scale: 3/10 Pain location: L/S and R hip; neck Pain description: sharp stabbing,  Aggravating factors: sitting Relieving factors: walking relieves hip pain, muscle relaxer   PRECAUTIONS: None  WEIGHT BEARING RESTRICTIONS No  FALLS:  Has patient fallen in last 6 months? 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    OBJECTIVE:   DIAGNOSTIC FINDINGS:  Hip X ray IMPRESSION: 1. Mild bilateral hip osteoarthritis. 2. Mild enthesopathic change at the right greater trochanter  Lumbar IMPRESSION: Degenerative change in the lumbar spine with facet hypertrophy and mild L1-L2 disc space narrowing.   PATIENT SURVEYS:  FOTO unavailable, plan to give ODI at next   SCREENING FOR RED FLAGS: Bowel or bladder incontinence: No Spinal tumors: No Cauda equina syndrome: No Compression fracture: No Abdominal aneurysm: No  COGNITION:  Overall cognitive status: Within functional limits for tasks assessed     SENSATION: WFL  MUSCLE LENGTH: Hamstrings: limited bilat in supine 90/90 position  POSTURE: rounded shoulders, forward head, and decreased lumbar lordosis  LUMBAR  ROM:   Active  A/PROM  eval  Flexion 75%  Extension 50% p!  Right lateral flexion 75%  Left lateral flexion 75%  Right rotation 100%  Left rotation 100%   (Blank rows = not tested)  LOWER EXTREMITY ROM:     Passive  Right eval Left eval  Hip flexion 100 120  Hip extension 0 10  Hip abduction 30 30  Hip adduction    Hip internal rotation 25 40  Hip external rotation 30 45   (Blank rows = not tested)  LOWER EXTREMITY MMT:    MMT Right eval Left eval  Hip flexion 4/5 4/5  Hip extension 4/5 4/5  Hip abduction 4/5 4/5  Hip adduction 4+/5 4+/5  Hip internal rotation 4/5 4/5  Hip external rotation 4/5 4/5   (Blank rows = not tested)  LUMBAR SPECIAL TESTS:  Straight leg raise test: Negative, FABER test: Positive, and Trendelenburg sign: Positive  FUNCTIONAL TESTS:  Step up and down: Bilat trendelenburg, decrease motor control with descent   GAIT: Distance  walked: 49f Assistive device utilized: None Level of assistance: Complete Independence Comments: decreaesd R hip extension, decreased R stance time, bilat Trendelenburg     TODAY'S TREATMENT   Exercises - Neutral Lumbar Spine Curl Up  - 2 x daily - 7 x weekly - 2 sets - 10 reps - 2 hold - Bridge with Posterior Pelvic Tilt  - 2 x daily - 7 x weekly - 2 sets - 10 reps - Supine Lower Trunk Rotation  - 2 x daily - 7 x weekly - 2 sets - 10 reps - 2 hold - Jefferson Curl  - 2 x daily - 7 x weekly - 1 sets - 10 reps - Shoulder External Rotation and Scapular Retraction with Resistance  - 2-3 x daily - 7 x weekly - 2 sets - 10 reps - Seated Cervical Retraction  - 2-3 x daily - 7 x weekly - 1 sets - 10 reps   PATIENT EDUCATION:  Education details: MOI, diagnosis, prognosis, anatomy, exercise progression, DOMS expectations, muscle firing,  envelope of function, HEP, POC  Person educated: Patient Education method: Explanation, Demonstration, Tactile cues, Verbal cues, and Handouts Education comprehension: verbalized  understanding, returned demonstration, verbal cues required, and tactile cues required   HOME EXERCISE PROGRAM: Access Code: [email or web address]: [email or web address] Date: 01/04/2022 Prepared by: ADaleen Bo ASSESSMENT:  CLINICAL IMPRESSION: Patient is a 67y.o. female who was seen today for physical therapy evaluation and treatment for c/c of LBP and R hip pain. Pt's s/s appear consistent with mechanical LBP and R GTPS. Pt's static work positioning and lack of strength training along with her cardiovascular exercise are likely contributing to increase in L/S and SIJ soft tissue sensitivity. Pt's pain is moderately sensitive and irritable with movement. Pt is largely limited by lumbopelvic strength and hip mobility at this time. Pt's pain limits her QOL and is affecting functional strength and exercise. Pt would benefit from continued skilled therapy in order to reach goals and maximize functional lumbopelvic strength and ROM for prevention of further functional decline.   OBJECTIVE IMPAIRMENTS Abnormal gait, decreased activity tolerance, decreased endurance, decreased mobility, difficulty walking, decreased ROM, decreased strength, hypomobility, increased muscle spasms, impaired flexibility, improper body mechanics, postural dysfunction, and pain.    ACTIVITY LIMITATIONS carrying, lifting, bending, sitting, standing, squatting, sleeping, stairs, transfers, and locomotion level   PARTICIPATION LIMITATIONS: cleaning, laundry, interpersonal relationship, driving, shopping, community activity, occupation, and exercise   PERSONAL FACTORS Age, Fitness, Time since onset of injury/illness/exacerbation, and 1-2 comorbidities:    are also affecting patient's functional outcome.    REHAB POTENTIAL: Fair     CLINICAL DECISION MAKING: Stable/uncomplicated   EVALUATION COMPLEXITY: Low     GOALS:     SHORT TERM GOALS: Target date: 02/15/2022    Pt will become independent with HEP in  order to demonstrate synthesis of PT education.     Goal status: INITIAL   2.  Pt will report at least 2 pt reduction on NPRS scale for pain in order to demonstrate functional improvement with household activity, self care, and ADL.    Goal status: INITIAL   3.  Pt will demonstrate at least a 12.8 improvement in Oswestry Index in order to demonstrate a clinically significant change in LBP and function.    Goal status: INITIAL     LONG TERM GOALS: Target date: 04/04/2022    Pt  will become independent with final HEP in order to demonstrate synthesis of PT education.    Goal status:  INITIAL   2. Pt will be able to demonstrate ability to ascend and descend stairs without bilateral hip drop and pain in order to demonstrate functional improvement in LE function for self-care and house hold duties.    Goal status: INITIAL   3.  Pt will be able to demonstrate/report ability to sit/stand/sleep for extended periods of time without pain in order to demonstrate functional improvement and tolerance to static positioning.    Goal status: INITIAL   4.  Pt will be able to demonstrate/report ability to walk >30 mins without pain in order to demonstrate functional improvement and tolerance to exercise and community mobility.    Goal status: INITIAL     PLAN: PT FREQUENCY: 1-2x/week   PT DURATION: 12 weeks (likely D/C in 8 wks)   PLANNED INTERVENTIONS: Therapeutic exercises, Therapeutic activity, Neuromuscular re-education, Balance training, Gait training, Patient/Family education, Joint manipulation, Joint mobilization, Stair training, Orthotic/Fit training, DME instructions, Aquatic Therapy, Dry Needling, Electrical stimulation, Spinal manipulation, Spinal mobilization, Cryotherapy, Moist heat, scar mobilization, Splintting, Taping, Vasopneumatic device, Traction, Ultrasound, Ionotophoresis '4mg'$ /ml Dexamethasone, Manual therapy, and Re-evaluation.   PLAN FOR NEXT SESSION: progress hip  strength, sidestepping, hip ext stretching, hip hinging/loaded hip hinging    Daleen Bo PT, DPT 01/04/22 5:57 PM

## 2022-01-13 ENCOUNTER — Other Ambulatory Visit: Payer: Self-pay | Admitting: Family Medicine

## 2022-01-19 ENCOUNTER — Encounter (HOSPITAL_BASED_OUTPATIENT_CLINIC_OR_DEPARTMENT_OTHER): Payer: Self-pay | Admitting: Physical Therapy

## 2022-01-19 ENCOUNTER — Ambulatory Visit (HOSPITAL_BASED_OUTPATIENT_CLINIC_OR_DEPARTMENT_OTHER): Payer: BC Managed Care – PPO | Attending: Family Medicine | Admitting: Physical Therapy

## 2022-01-19 DIAGNOSIS — M5459 Other low back pain: Secondary | ICD-10-CM | POA: Insufficient documentation

## 2022-01-19 DIAGNOSIS — M25551 Pain in right hip: Secondary | ICD-10-CM | POA: Diagnosis not present

## 2022-01-19 DIAGNOSIS — M6281 Muscle weakness (generalized): Secondary | ICD-10-CM | POA: Diagnosis not present

## 2022-01-19 NOTE — Therapy (Signed)
OUTPATIENT PHYSICAL THERAPY THORACOLUMBAR EVALUATION   Patient Name: Margaret [name] MRN: [id number] DOB:Oct 29, 1955, 66 y.o., female Today's Date: 01/19/2022   PT End of Session - 01/19/22 0843     Visit Number 2    Number of Visits 16    Date for PT Re-Evaluation 04/04/22    Authorization Type BCBS    PT Start Time 0805    PT Stop Time 0838    PT Time Calculation (min) 33 min    Activity Tolerance Patient tolerated treatment well    Behavior During Therapy [facility] for tasks assessed/performed              Past Medical History:  Diagnosis Date   Arthritis    Endometrial cancer ([place])    Exercise-induced asthma    Heart murmur    History of bradycardia 2011   had an episode of HR 38 at time of blood work w/ palpitations and SOB,  work-up done by cardiology (dr dalton Aundra Dubin) stress echo normal on 01/ 2012 and (12/ 2011) holter monitor with PACs with average HR 68/  07-14-2017 per pt no issues since then   Hx of colonic polyps    x3   IBS (irritable bowel syndrome)    OSA (obstructive sleep apnea) 07-14-2017 per had a new study on 07-12-2017 have not heard about results yet   per study 01-06-2005 moderate OSA (AHI 23.3/hr) used cpap, per pt lost weight withthe resolution of symptoms but has gain weight back   Type 2 diabetes mellitus ([place])    Wears glasses    Past Surgical History:  Procedure Laterality Date   colonoscopy with polypectomy  2012 approx.   DOBUTAMINE STRESS ECHO  04/2010   normal with no evidence of ischemia or infarction   LAPAROSCOPIC CHOLECYSTECTOMY  1996   PLANTAR FASCIA RELEASE     ROBOTIC ASSISTED SUPRACERVICAL HYSTERECTOMY WITH BILATERAL SALPINGO OOPHERECTOMY N/A 07/19/2017   Procedure: XI ROBOTIC ASSISTED  HYSTERECTOMY WITH BILATERAL SALPINGO OOPHORECTOMY; SENTINEL LYMPH NODE BIOPSY;  Surgeon: Everitt Amber, MD;  Location: [facility];  Service: Gynecology;  Laterality: N/A;   SENTINEL NODE BIOPSY N/A 07/19/2017   Procedure: SENTINEL NODE BIOPSY;  Surgeon:  Everitt Amber, MD;  Location: [facility];  Service: Gynecology;  Laterality: N/A;   TRANSTHORACIC ECHOCARDIOGRAM  04/17/2010   ef 55%/  trivial AR and TR/ left LAE/     Patient Active Problem List   Diagnosis Date Noted   Hyperlipidemia associated with type 2 diabetes mellitus ([place]) 04/30/2019   Greater trochanteric bursitis of right hip 04/24/2018   Malignant endometrial neoplasm with intraepithelial neoplasia ([place]) 07/19/2017   Tear of gluteus minimus tendon, right, initial encounter 02/22/2017   Routine general medical examination at a health care facility 10/11/2014   Sleep apnea 05/08/2012   CARDIAC MURMUR 03/30/2010   PAC (premature atrial contraction) 03/30/2010   Diabetes mellitus type 2 in obese ([place]) 02/01/2008   ELEVATED BLOOD PRESSURE WITHOUT DIAGNOSIS OF HYPERTENSION 02/01/2008    PCP: Hoyt Koch, MD  REFERRING PROVIDER: Lyndal Pulley, DO  REFERRING DIAG:  [phone] (ICD-10-CM) - Right hip pain  M54.50 (ICD-10-CM) - Low back pain, unspecified back pain laterality, unspecified chronicity, unspecified whether sciatica present    Rationale for Evaluation and Treatment Rehabilitation  THERAPY DIAG:  Other low back pain  Muscle weakness (generalized)  Pain in right hip  ONSET DATE: Summer 2023  SUBJECTIVE:  SUBJECTIVE STATEMENT:  Pt feels that the back feels better with less frequency of pain. Pt states that changing her work chair has also made a difference.     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 She has only managed it conservatively with crutches at the time. The hip is a chronic issue. The L/S is what has recently flared.   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 Pt [facility] and sits all day  which she believes the root cause. Pt states that the back, neck, and hip hurt usually by the end of the work week. She states that the back pain came long after the hip pain. Pt states that the back feels more like spasming into the low back. She also has shooting pain into the neck. She will have NT into the R thigh, does not cross the knee.   Pt does some mild some resistance training and and yoga occassionally, She may only do it a few times a month. Pt has regular check ups for history of cancer/ had hysterectomy.  Pt denies other red flags.   Pt does note that she has increased pain with high stress works weeks.   PERTINENT HISTORY:  endometrial neoplasm 2019 surgery to remove  PAIN:  Are you having pain? No: NPRS scale: 0/10 Pain location: L/S and R hip; neck Pain description: sharp stabbing,  Aggravating factors: sitting Relieving factors: walking relieves hip pain, muscle relaxer   PRECAUTIONS: None  WEIGHT BEARING RESTRICTIONS No  FALLS:  Has patient fallen in last 6 months? 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    OBJECTIVE:   DIAGNOSTIC FINDINGS:  Hip X ray IMPRESSION: 1. Mild bilateral hip osteoarthritis. 2. Mild enthesopathic change at the right greater trochanter  Lumbar IMPRESSION: Degenerative change in the lumbar spine with facet hypertrophy and mild L1-L2 disc space narrowing.   PATIENT SURVEYS:  ODI  TODAY'S TREATMENT   Exercises - Neutral Lumbar Spine Curl Up  - 2s 10x - fig 4 bridge 2x10 each - Supine Lower Trunk Rotation  - 2 x daily - 7 x weekly - 2 sets - 10 reps - 2 hold -standing hip ext stretch 30s 2x each - Jefferson Curl  - 2 x daily - 7 x weekly - 1 sets - 10 reps -STS with hip hinge  focus 5lbs 4x5 Sidestepping with GTB at knees 2x laps 14f    PATIENT EDUCATION:  Education details: anatomy, exercise progression, DOMS expectations, muscle firing,  envelope of function, HEP, POC  Person educated: Patient Education method: Explanation, Demonstration, Tactile cues, Verbal cues, and Handouts Education comprehension: verbalized understanding, returned demonstration, verbal cues required, and tactile cues required   HOME EXERCISE PROGRAM: Access Code: [email or web address]: [email or web address] Date: 01/04/2022 Prepared by: ADaleen Bo ASSESSMENT:  CLINICAL IMPRESSION: Pt demonstrates good tolerance to increase in SIJ loading at today's session. Pt without irritation into L/S or SIJ. Pt able to update HEP and perform more CKC motions for SIJ force closure/stability. Pt progressing very well. Plan to continue with independent HEP for pain management and progression of hip and L/S exercise. Consider hip hinging/RDL type motion at next and STS with band for home. Pt  would benefit from continued skilled therapy in order to reach goals and maximize functional lumbopelvic strength and ROM for prevention of further functional decline.   OBJECTIVE IMPAIRMENTS Abnormal gait, decreased activity tolerance, decreased endurance, decreased mobility, difficulty walking, decreased ROM, decreased strength, hypomobility, increased muscle spasms, impaired flexibility, improper body mechanics, postural dysfunction, and pain.    ACTIVITY LIMITATIONS carrying, lifting, bending, sitting, standing, squatting, sleeping, stairs, transfers, and locomotion level   PARTICIPATION LIMITATIONS: cleaning, laundry, interpersonal relationship, driving, shopping, community activity, occupation, and exercise   PERSONAL FACTORS Age, Fitness, Time since onset of injury/illness/exacerbation, and 1-2 comorbidities:    are also affecting patient's functional outcome.    REHAB POTENTIAL: Fair      CLINICAL DECISION MAKING: Stable/uncomplicated   EVALUATION COMPLEXITY: Low     GOALS:     SHORT TERM GOALS: Target date: 03/02/2022    Pt will become independent with HEP in order to demonstrate synthesis of PT education.     Goal status: INITIAL   2.  Pt will report at least 2 pt reduction on NPRS scale for pain in order to demonstrate functional improvement with household activity, self care, and ADL.    Goal status: INITIAL   3.  Pt will demonstrate at least a 12.8 improvement in Oswestry Index in order to demonstrate a clinically significant change in LBP and function.    Goal status: INITIAL     LONG TERM GOALS: Target date: 04/19/2022    Pt  will become independent with final HEP in order to demonstrate synthesis of PT education.    Goal status: INITIAL   2. Pt will be able to demonstrate ability to ascend and descend stairs without bilateral hip drop and pain in order to demonstrate functional improvement in LE function for self-care and house hold duties.    Goal status: INITIAL   3.  Pt will be able to demonstrate/report ability to sit/stand/sleep for extended periods of time without pain in order to demonstrate functional improvement and tolerance to static positioning.    Goal status: INITIAL   4.  Pt will be able to demonstrate/report ability to walk >30 mins without pain in order to demonstrate functional improvement and tolerance to exercise and community mobility.    Goal status: INITIAL     PLAN: PT FREQUENCY: 1-2x/week   PT DURATION: 12 weeks (likely D/C in 8 wks)   PLANNED INTERVENTIONS: Therapeutic exercises, Therapeutic activity, Neuromuscular re-education, Balance training, Gait training, Patient/Family education, Joint manipulation, Joint mobilization, Stair training, Orthotic/Fit training, DME instructions, Aquatic Therapy, Dry Needling, Electrical stimulation, Spinal manipulation, Spinal mobilization, Cryotherapy, Moist heat, scar  mobilization, Splintting, Taping, Vasopneumatic device, Traction, Ultrasound, Ionotophoresis '4mg'$ /ml Dexamethasone, Manual therapy, and Re-evaluation.   PLAN FOR NEXT SESSION: progress hip strength, progress hip ext stretching, progress hip hinging/loaded hip hinging    Daleen Bo PT, DPT 01/19/22 8:43 AM

## 2022-01-21 ENCOUNTER — Other Ambulatory Visit: Payer: Self-pay | Admitting: Family Medicine

## 2022-02-03 ENCOUNTER — Encounter (HOSPITAL_BASED_OUTPATIENT_CLINIC_OR_DEPARTMENT_OTHER): Payer: Self-pay | Admitting: Physical Therapy

## 2022-02-03 ENCOUNTER — Ambulatory Visit (HOSPITAL_BASED_OUTPATIENT_CLINIC_OR_DEPARTMENT_OTHER): Payer: BC Managed Care – PPO | Admitting: Physical Therapy

## 2022-02-03 DIAGNOSIS — M25551 Pain in right hip: Secondary | ICD-10-CM

## 2022-02-03 DIAGNOSIS — M5459 Other low back pain: Secondary | ICD-10-CM | POA: Diagnosis not present

## 2022-02-03 DIAGNOSIS — M6281 Muscle weakness (generalized): Secondary | ICD-10-CM

## 2022-02-03 NOTE — Therapy (Signed)
OUTPATIENT PHYSICAL THERAPY THORACOLUMBAR EVALUATION   Patient Name: Margaret [name] MRN: [id number] DOB:1955-05-21, 66 y.o., female Today's Date: 02/03/2022   PT End of Session - 02/03/22 1552     Visit Number 2    Number of Visits 16    Date for PT Re-Evaluation 04/04/22    Authorization Type BCBS    PT Start Time 1555    PT Stop Time 1540    PT Time Calculation (min) 40 min    Activity Tolerance Patient tolerated treatment well    Behavior During Therapy [facility] for tasks assessed/performed              Past Medical History:  Diagnosis Date   Arthritis    Endometrial cancer ([place])    Exercise-induced asthma    Heart murmur    History of bradycardia 2011   had an episode of HR 38 at time of blood work w/ palpitations and SOB,  work-up done by cardiology (dr dalton Aundra Dubin) stress echo normal on 01/ 2012 and (12/ 2011) holter monitor with PACs with average HR 68/  07-14-2017 per pt no issues since then   Hx of colonic polyps    x3   IBS (irritable bowel syndrome)    OSA (obstructive sleep apnea) 07-14-2017 per had a new study on 07-12-2017 have not heard about results yet   per study 01-06-2005 moderate OSA (AHI 23.3/hr) used cpap, per pt lost weight withthe resolution of symptoms but has gain weight back   Type 2 diabetes mellitus ([place])    Wears glasses    Past Surgical History:  Procedure Laterality Date   colonoscopy with polypectomy  2012 approx.   DOBUTAMINE STRESS ECHO  04/2010   normal with no evidence of ischemia or infarction   LAPAROSCOPIC CHOLECYSTECTOMY  1996   PLANTAR FASCIA RELEASE     ROBOTIC ASSISTED SUPRACERVICAL HYSTERECTOMY WITH BILATERAL SALPINGO OOPHERECTOMY N/A 07/19/2017   Procedure: XI ROBOTIC ASSISTED  HYSTERECTOMY WITH BILATERAL SALPINGO OOPHORECTOMY; SENTINEL LYMPH NODE BIOPSY;  Surgeon: Everitt Amber, MD;  Location: [facility];  Service: Gynecology;  Laterality: N/A;   SENTINEL NODE BIOPSY N/A 07/19/2017   Procedure: SENTINEL NODE BIOPSY;  Surgeon:  Everitt Amber, MD;  Location: [facility];  Service: Gynecology;  Laterality: N/A;   TRANSTHORACIC ECHOCARDIOGRAM  04/17/2010   ef 55%/  trivial AR and TR/ left LAE/     Patient Active Problem List   Diagnosis Date Noted   Hyperlipidemia associated with type 2 diabetes mellitus ([place]) 04/30/2019   Greater trochanteric bursitis of right hip 04/24/2018   Malignant endometrial neoplasm with intraepithelial neoplasia ([place]) 07/19/2017   Tear of gluteus minimus tendon, right, initial encounter 02/22/2017   Routine general medical examination at a health care facility 10/11/2014   Sleep apnea 05/08/2012   CARDIAC MURMUR 03/30/2010   PAC (premature atrial contraction) 03/30/2010   Diabetes mellitus type 2 in obese ([place]) 02/01/2008   ELEVATED BLOOD PRESSURE WITHOUT DIAGNOSIS OF HYPERTENSION 02/01/2008    PCP: Hoyt Koch, MD  REFERRING PROVIDER: Lyndal Pulley, DO  REFERRING DIAG:  [phone] (ICD-10-CM) - Right hip pain  M54.50 (ICD-10-CM) - Low back pain, unspecified back pain laterality, unspecified chronicity, unspecified whether sciatica present    Rationale for Evaluation and Treatment Rehabilitation  THERAPY DIAG:  Other low back pain  Muscle weakness (generalized)  Pain in right hip  ONSET DATE: Summer 2023  SUBJECTIVE:  SUBJECTIVE STATEMENT:  Pt states that the back pain felt good until Tuesday. She points to the R SIJ area. Pt still continues to walk. Pt states she had some spasm into the R L/S as well.     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 She has only managed it conservatively with crutches at the time. The hip is a chronic issue. The L/S is what has recently flared.   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 Pt [facility] and sits all day which she believes the root cause. Pt states that the back, neck, and hip hurt usually by the end of the work week. She states that the back pain came long after the hip pain. Pt states that the back feels more like spasming into the low back. She also has shooting pain into the neck. She will have NT into the R thigh, does not cross the knee.   Pt does some mild some resistance training and and yoga occassionally, She may only do it a few times a month. Pt has regular check ups for history of cancer/ had hysterectomy.  Pt denies other red flags.   Pt does note that she has increased pain with high stress works weeks.   PERTINENT HISTORY:  endometrial neoplasm 2019 surgery to remove  PAIN:  Are you having pain? Yes: NPRS scale: 4/10 Pain location: L/S and R hip; neck Pain description: sharp stabbing,  Aggravating factors: sitting Relieving factors: walking relieves hip pain, muscle relaxer   PRECAUTIONS: None  WEIGHT BEARING RESTRICTIONS No  FALLS:  Has patient fallen in last 6 months? 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    OBJECTIVE:   DIAGNOSTIC FINDINGS:  Hip X ray IMPRESSION: 1. Mild bilateral hip osteoarthritis. 2. Mild enthesopathic change at the right greater trochanter  Lumbar IMPRESSION: Degenerative change in the lumbar spine with facet hypertrophy and mild L1-L2 disc space narrowing.   PATIENT SURVEYS:  Oswestry Low Back Pain Disability Questionnaire: 14 / 50 = 28.0 %  TODAY'S TREATMENT   STM R paraspinals and QL Self STM with tennis ball  Exercises  -PPT 2s 10x - Neutral Lumbar Spine Curl Up  - 2s 10x -DL bridge with band YTB 10x - SL bridge 2x10 each -5lb RDL 2x10  halfROM to table -GTB paloff press 2x10 -STS YTB at knees 2x10 -seated QL stretch 30s 2x each     PATIENT EDUCATION:  Education details: anatomy, exercise progression, DOMS expectations, muscle firing,  envelope of function, HEP, POC  Person educated: Patient Education method: Explanation, Demonstration, Tactile cues, Verbal cues, and Handouts Education comprehension: verbalized understanding, returned demonstration, verbal cues required, and tactile cues required   HOME EXERCISE PROGRAM: Access Code: [email or web address] (prefers electronic copy) URL: [email or web address] Date: 01/04/2022 Prepared by: Daleen Bo  ASSESSMENT:  CLINICAL IMPRESSION: Pt presented with increase R SIJ and L/S type pain at beginning of session that was reduced to 2/10 by end of session. Pt able to progress HEP today to include more hip ABD strength and SIJ stability type exercise. Pt advised on self massage techniques for self pain management. Plan to continue with lumbar strengthening as tolerated with review of RDL to greater  ROM.  Pt would benefit from continued skilled therapy in order to reach goals and maximize functional lumbopelvic strength and ROM for prevention of further functional decline.   OBJECTIVE IMPAIRMENTS Abnormal gait, decreased activity tolerance, decreased endurance, decreased mobility, difficulty walking, decreased ROM, decreased strength, hypomobility, increased muscle spasms, impaired flexibility, improper body mechanics, postural dysfunction, and pain.    ACTIVITY LIMITATIONS carrying, lifting, bending, sitting, standing, squatting, sleeping, stairs, transfers, and locomotion level   PARTICIPATION LIMITATIONS: cleaning, laundry, interpersonal relationship, driving, shopping, community activity, occupation, and exercise   PERSONAL FACTORS Age, Fitness, Time since onset of injury/illness/exacerbation, and 1-2 comorbidities:    are also affecting patient's functional outcome.     REHAB POTENTIAL: Fair     CLINICAL DECISION MAKING: Stable/uncomplicated   EVALUATION COMPLEXITY: Low     GOALS:     SHORT TERM GOALS: Target date: 03/17/2022    Pt will become independent with HEP in order to demonstrate synthesis of PT education.     Goal status: INITIAL   2.  Pt will report at least 2 pt reduction on NPRS scale for pain in order to demonstrate functional improvement with household activity, self care, and ADL.    Goal status: INITIAL   3.  Pt will demonstrate at least a 12.8 improvement in Oswestry Index in order to demonstrate a clinically significant change in LBP and function.    Goal status: INITIAL     LONG TERM GOALS: Target date: 05/04/2022    Pt  will become independent with final HEP in order to demonstrate synthesis of PT education.    Goal status: INITIAL   2. Pt will be able to demonstrate ability to ascend and descend stairs without bilateral hip drop and pain in order to demonstrate functional improvement in LE function for self-care and house hold duties.    Goal status: INITIAL   3.  Pt will be able to demonstrate/report ability to sit/stand/sleep for extended periods of time without pain in order to demonstrate functional improvement and tolerance to static positioning.    Goal status: INITIAL   4.  Pt will be able to demonstrate/report ability to walk >30 mins without pain in order to demonstrate functional improvement and tolerance to exercise and community mobility.    Goal status: INITIAL     PLAN: PT FREQUENCY: 1-2x/week   PT DURATION: 12 weeks (likely D/C in 8 wks)   PLANNED INTERVENTIONS: Therapeutic exercises, Therapeutic activity, Neuromuscular re-education, Balance training, Gait training, Patient/Family education, Joint manipulation, Joint mobilization, Stair training, Orthotic/Fit training, DME instructions, Aquatic Therapy, Dry Needling, Electrical stimulation, Spinal manipulation, Spinal mobilization,  Cryotherapy, Moist heat, scar mobilization, Splintting, Taping, Vasopneumatic device, Traction, Ultrasound, Ionotophoresis '4mg'$ /ml Dexamethasone, Manual therapy, and Re-evaluation.   PLAN FOR NEXT SESSION: progress hip strength, progress hip ext stretching, progress hip hinging/loaded hip hinging    Daleen Bo PT, DPT 02/03/22 4:40 PM

## 2022-02-09 ENCOUNTER — Ambulatory Visit: Payer: BC Managed Care – PPO | Admitting: Family Medicine

## 2022-02-16 ENCOUNTER — Ambulatory Visit (HOSPITAL_BASED_OUTPATIENT_CLINIC_OR_DEPARTMENT_OTHER): Payer: BC Managed Care – PPO | Admitting: Physical Therapy

## 2022-02-16 ENCOUNTER — Encounter (HOSPITAL_BASED_OUTPATIENT_CLINIC_OR_DEPARTMENT_OTHER): Payer: Self-pay | Admitting: Physical Therapy

## 2022-02-16 DIAGNOSIS — M5459 Other low back pain: Secondary | ICD-10-CM | POA: Diagnosis not present

## 2022-02-16 DIAGNOSIS — M6281 Muscle weakness (generalized): Secondary | ICD-10-CM | POA: Diagnosis not present

## 2022-02-16 DIAGNOSIS — M25551 Pain in right hip: Secondary | ICD-10-CM | POA: Diagnosis not present

## 2022-02-16 NOTE — Therapy (Signed)
OUTPATIENT PHYSICAL THERAPY THORACOLUMBAR EVALUATION   Patient Name: Margaret [name] MRN: [id number] DOB:[birth year], 66 y.o., female Today's Date: 02/16/2022   PT End of Session - 02/16/22 1630     Visit Number 4    Number of Visits 16    Date for PT Re-Evaluation 04/04/22    Authorization Type BCBS    PT Start Time 1601    PT Stop Time 1640    PT Time Calculation (min) 39 min    Activity Tolerance Patient tolerated treatment well    Behavior During Therapy [facility] for tasks assessed/performed               Past Medical History:  Diagnosis Date   Arthritis    Endometrial cancer ([place])    Exercise-induced asthma    Heart murmur    History of bradycardia 2011   had an episode of HR 38 at time of blood work w/ palpitations and SOB,  work-up done by cardiology (dr dalton Aundra Dubin) stress echo normal on 01/ 2012 and (12/ 2011) holter monitor with PACs with average HR 68/  07-14-2017 per pt no issues since then   Hx of colonic polyps    x3   IBS (irritable bowel syndrome)    OSA (obstructive sleep apnea) 07-14-2017 per had a new study on 07-12-2017 have not heard about results yet   per study 01-06-2005 moderate OSA (AHI 23.3/hr) used cpap, per pt lost weight withthe resolution of symptoms but has gain weight back   Type 2 diabetes mellitus ([place])    Wears glasses    Past Surgical History:  Procedure Laterality Date   colonoscopy with polypectomy  2012 approx.   DOBUTAMINE STRESS ECHO  04/2010   normal with no evidence of ischemia or infarction   LAPAROSCOPIC CHOLECYSTECTOMY  1996   PLANTAR FASCIA RELEASE     ROBOTIC ASSISTED SUPRACERVICAL HYSTERECTOMY WITH BILATERAL SALPINGO OOPHERECTOMY N/A 07/19/2017   Procedure: XI ROBOTIC ASSISTED  HYSTERECTOMY WITH BILATERAL SALPINGO OOPHORECTOMY; SENTINEL LYMPH NODE BIOPSY;  Surgeon: Everitt Amber, MD;  Location: [facility];  Service: Gynecology;  Laterality: N/A;   SENTINEL NODE BIOPSY N/A 07/19/2017   Procedure: SENTINEL NODE BIOPSY;   Surgeon: Everitt Amber, MD;  Location: [facility];  Service: Gynecology;  Laterality: N/A;   TRANSTHORACIC ECHOCARDIOGRAM  04/17/2010   ef 55%/  trivial AR and TR/ left LAE/     Patient Active Problem List   Diagnosis Date Noted   Hyperlipidemia associated with type 2 diabetes mellitus ([place]) 04/30/2019   Greater trochanteric bursitis of right hip 04/24/2018   Malignant endometrial neoplasm with intraepithelial neoplasia ([place]) 07/19/2017   Tear of gluteus minimus tendon, right, initial encounter 02/22/2017   Routine general medical examination at a health care facility 10/11/2014   Sleep apnea 05/08/2012   CARDIAC MURMUR 03/30/2010   PAC (premature atrial contraction) 03/30/2010   Diabetes mellitus type 2 in obese ([place]) 02/01/2008   ELEVATED BLOOD PRESSURE WITHOUT DIAGNOSIS OF HYPERTENSION 02/01/2008    PCP: Hoyt Koch, MD  REFERRING PROVIDER: Lyndal Pulley, DO  REFERRING DIAG:  [phone] (ICD-10-CM) - Right hip pain  M54.50 (ICD-10-CM) - Low back pain, unspecified back pain laterality, unspecified chronicity, unspecified whether sciatica present    Rationale for Evaluation and Treatment Rehabilitation  THERAPY DIAG:  Other low back pain  Muscle weakness (generalized)  Pain in right hip  ONSET DATE: Summer 2023  SUBJECTIVE:  SUBJECTIVE STATEMENT:  Pt states that the back pain feels good today. She states the pain is gone essentially today. She has had moments of pain but not too much.     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 She has only managed it conservatively with crutches at the time. The hip is a chronic issue. The L/S is what has recently flared.   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 Pt [facility]  and sits all day which she believes the root cause. Pt states that the back, neck, and hip hurt usually by the end of the work week. She states that the back pain came long after the hip pain. Pt states that the back feels more like spasming into the low back. She also has shooting pain into the neck. She will have NT into the R thigh, does not cross the knee.   Pt does some mild some resistance training and and yoga occassionally, She may only do it a few times a month. Pt has regular check ups for history of cancer/ had hysterectomy.  Pt denies other red flags.   Pt does note that she has increased pain with high stress works weeks.   PERTINENT HISTORY:  endometrial neoplasm 2019 surgery to remove  PAIN:  Are you having pain? No: NPRS scale: 0/10 Pain location: L/S and R hip; neck Pain description: sharp stabbing,  Aggravating factors: sitting Relieving factors: walking relieves hip pain, muscle relaxer   PRECAUTIONS: None  WEIGHT BEARING RESTRICTIONS No  FALLS:  Has patient fallen in last 6 months? 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    OBJECTIVE:   DIAGNOSTIC FINDINGS:  Hip X ray IMPRESSION: 1. Mild bilateral hip osteoarthritis. 2. Mild enthesopathic change at the right greater trochanter  Lumbar IMPRESSION: Degenerative change in the lumbar spine with facet hypertrophy and mild L1-L2 disc space narrowing.   PATIENT SURVEYS:  Oswestry Low Back Pain Disability Questionnaire: 14 / 50 = 28.0 %  TODAY'S TREATMENT   Nu-step lvl 6 5 min  Exercises -bird dog 3x10 -PPT 2s 10x -sidestepping GTB at ankles 42f  -10lb RDL at wall 3x8 -GTB paloff press 2x10 -STS GTB at knees 3x6 10lbs -seated QL stretch 30s 2x  each     PATIENT EDUCATION:  Education details: anatomy, exercise progression, DOMS expectations, muscle firing,  envelope of function, HEP, POC  Person educated: Patient Education method: Explanation, Demonstration, Tactile cues, Verbal cues, and Handouts Education comprehension: verbalized understanding, returned demonstration, verbal cues required, and tactile cues required   HOME EXERCISE PROGRAM: Access Code: [email or web address](prefers electronic copy) URL: [email or web address] Date: 01/04/2022 Prepared by: ADaleen Bo ASSESSMENT:  CLINICAL IMPRESSION: Pt with no pain at today's session and able to progress with previous level of exercise. Intensity and volume of exercise were both able to be increased at today's session without irritation. Pt demo'd good technique retention with RDL and hip hinging motion today. Pt advised to reduce frequency of strengthening with HEP in order to promote recovery. Plan to continue with strengthening as tolerated at future session and plan for transition to gym based strengthening as tolerated. Pt would benefit from continued skilled therapy in order to reach  goals and maximize functional lumbopelvic strength and ROM for prevention of further functional decline.   OBJECTIVE IMPAIRMENTS Abnormal gait, decreased activity tolerance, decreased endurance, decreased mobility, difficulty walking, decreased ROM, decreased strength, hypomobility, increased muscle spasms, impaired flexibility, improper body mechanics, postural dysfunction, and pain.    ACTIVITY LIMITATIONS carrying, lifting, bending, sitting, standing, squatting, sleeping, stairs, transfers, and locomotion level   PARTICIPATION LIMITATIONS: cleaning, laundry, interpersonal relationship, driving, shopping, community activity, occupation, and exercise   PERSONAL FACTORS Age, Fitness, Time since onset of injury/illness/exacerbation, and 1-2 comorbidities:    are also affecting patient's  functional outcome.    REHAB POTENTIAL: Fair     CLINICAL DECISION MAKING: Stable/uncomplicated   EVALUATION COMPLEXITY: Low     GOALS:     SHORT TERM GOALS: Target date: 03/30/2022    Pt will become independent with HEP in order to demonstrate synthesis of PT education.     Goal status: INITIAL   2.  Pt will report at least 2 pt reduction on NPRS scale for pain in order to demonstrate functional improvement with household activity, self care, and ADL.    Goal status: INITIAL   3.  Pt will demonstrate at least a 12.8 improvement in Oswestry Index in order to demonstrate a clinically significant change in LBP and function.    Goal status: INITIAL     LONG TERM GOALS: Target date: 05/17/2022    Pt  will become independent with final HEP in order to demonstrate synthesis of PT education.    Goal status: INITIAL   2. Pt will be able to demonstrate ability to ascend and descend stairs without bilateral hip drop and pain in order to demonstrate functional improvement in LE function for self-care and house hold duties.    Goal status: INITIAL   3.  Pt will be able to demonstrate/report ability to sit/stand/sleep for extended periods of time without pain in order to demonstrate functional improvement and tolerance to static positioning.    Goal status: INITIAL   4.  Pt will be able to demonstrate/report ability to walk >30 mins without pain in order to demonstrate functional improvement and tolerance to exercise and community mobility.    Goal status: INITIAL     PLAN: PT FREQUENCY: 1-2x/week   PT DURATION: 12 weeks (likely D/C in 8 wks)   PLANNED INTERVENTIONS: Therapeutic exercises, Therapeutic activity, Neuromuscular re-education, Balance training, Gait training, Patient/Family education, Joint manipulation, Joint mobilization, Stair training, Orthotic/Fit training, DME instructions, Aquatic Therapy, Dry Needling, Electrical stimulation, Spinal manipulation, Spinal  mobilization, Cryotherapy, Moist heat, scar mobilization, Splintting, Taping, Vasopneumatic device, Traction, Ultrasound, Ionotophoresis '4mg'$ /ml Dexamethasone, Manual therapy, and Re-evaluation.   PLAN FOR NEXT SESSION: progress hip strength, progress hip ext stretching, progress hip hinging/loaded hip hinging    Daleen Bo PT, DPT 02/16/22 4:42 PM

## 2022-02-18 DIAGNOSIS — L538 Other specified erythematous conditions: Secondary | ICD-10-CM | POA: Diagnosis not present

## 2022-02-18 DIAGNOSIS — L570 Actinic keratosis: Secondary | ICD-10-CM | POA: Diagnosis not present

## 2022-02-18 DIAGNOSIS — D492 Neoplasm of unspecified behavior of bone, soft tissue, and skin: Secondary | ICD-10-CM | POA: Diagnosis not present

## 2022-03-02 ENCOUNTER — Ambulatory Visit (HOSPITAL_BASED_OUTPATIENT_CLINIC_OR_DEPARTMENT_OTHER): Payer: BC Managed Care – PPO | Attending: Family Medicine | Admitting: Physical Therapy

## 2022-03-02 ENCOUNTER — Encounter (HOSPITAL_BASED_OUTPATIENT_CLINIC_OR_DEPARTMENT_OTHER): Payer: Self-pay | Admitting: Physical Therapy

## 2022-03-02 DIAGNOSIS — M25551 Pain in right hip: Secondary | ICD-10-CM | POA: Diagnosis not present

## 2022-03-02 DIAGNOSIS — M5459 Other low back pain: Secondary | ICD-10-CM | POA: Diagnosis not present

## 2022-03-02 DIAGNOSIS — M6281 Muscle weakness (generalized): Secondary | ICD-10-CM | POA: Diagnosis not present

## 2022-03-02 NOTE — Progress Notes (Unsigned)
  [place] Phone: [phone] Subjective:   Margaret [name], am serving as a scribe for Dr. Hulan Saas.  I'm seeing this patient by the request  of:  Hoyt Koch, MD  CC: right hip and low back pain   FTD:[id number]  11/24/2021 Worsening pain again.  Patient has responded well to injections previously.  Patient though has had more spasms in the back.  He can have further evaluated.  Discussed with patient at great length and due to this exacerbation given muscle relaxers to take at night as well as referred to formal physical therapy.  Depending on any weakness patient should see [place] sooner.  Follow-up with me again in 6 to 8 weeks total time reviewing patient's chart and discussing different treatment options with patient 36 minutes    Update 03/03/2022 Margaret [name] is a 66 y.o. female coming in with complaint of R hip and LBP.  Patient was referred to physical therapy and even some therapist yesterday.  Patient states that therapy is helping to decrease the back spasms and hip pain. Has one more therapy session remaining. Has some soreness in the hip today as she did some machine strength training yesterday.   Objective  Blood pressure 122/86, pulse 63, height '5\' 4"'$  (1.626 m), SpO2 97 %.   General: [name] apparent distress alert and oriented x3 mood and affect normal, dressed appropriately.  HEENT: Pupils equal, extraocular movements intact  Respiratory: Patient's speak in full sentences and does not appear short of breath  Cardiovascular: [name] lower extremity edema, non tender, [name] erythema  Right hip exam does have some tenderness to palpation over the greater trochanteric area but very mild.  Positive tightness with Corky Sox on the right compared to left.  Negative straight leg test at the moment.    Impression and Recommendations:    The above documentation has been reviewed and is accurate and complete  Lyndal Pulley, DO

## 2022-03-02 NOTE — Therapy (Signed)
OUTPATIENT PHYSICAL THERAPY THORACOLUMBAR TREATMENT   Patient Name: Margaret [name] MRN: [id number] DOB:1955-09-03, 66 y.o., female Today's Date: 03/02/2022   PT End of Session - 03/02/22 1608     Visit Number 5    Number of Visits 16    Date for PT Re-Evaluation 04/04/22    Authorization Type BCBS    PT Start Time 1600    PT Stop Time 1630    PT Time Calculation (min) 30 min    Activity Tolerance Patient tolerated treatment well    Behavior During Therapy [facility] for tasks assessed/performed                Past Medical History:  Diagnosis Date   Arthritis    Endometrial cancer ([place])    Exercise-induced asthma    Heart murmur    History of bradycardia 2011   had an episode of HR 38 at time of blood work w/ palpitations and SOB,  work-up done by cardiology (dr dalton Aundra Dubin) stress echo normal on 01/ 2012 and (12/ 2011) holter monitor with PACs with average HR 68/  07-14-2017 per pt no issues since then   Hx of colonic polyps    x3   IBS (irritable bowel syndrome)    OSA (obstructive sleep apnea) 07-14-2017 per had a new study on 07-12-2017 have not heard about results yet   per study 01-06-2005 moderate OSA (AHI 23.3/hr) used cpap, per pt lost weight withthe resolution of symptoms but has gain weight back   Type 2 diabetes mellitus ([place])    Wears glasses    Past Surgical History:  Procedure Laterality Date   colonoscopy with polypectomy  2012 approx.   DOBUTAMINE STRESS ECHO  04/2010   normal with no evidence of ischemia or infarction   LAPAROSCOPIC CHOLECYSTECTOMY  1996   PLANTAR FASCIA RELEASE     ROBOTIC ASSISTED SUPRACERVICAL HYSTERECTOMY WITH BILATERAL SALPINGO OOPHERECTOMY N/A 07/19/2017   Procedure: XI ROBOTIC ASSISTED  HYSTERECTOMY WITH BILATERAL SALPINGO OOPHORECTOMY; SENTINEL LYMPH NODE BIOPSY;  Surgeon: Everitt Amber, MD;  Location: [facility];  Service: Gynecology;  Laterality: N/A;   SENTINEL NODE BIOPSY N/A 07/19/2017   Procedure: SENTINEL NODE BIOPSY;   Surgeon: Everitt Amber, MD;  Location: [facility];  Service: Gynecology;  Laterality: N/A;   TRANSTHORACIC ECHOCARDIOGRAM  04/17/2010   ef 55%/  trivial AR and TR/ left LAE/     Patient Active Problem List   Diagnosis Date Noted   Hyperlipidemia associated with type 2 diabetes mellitus ([place]) 04/30/2019   Greater trochanteric bursitis of right hip 04/24/2018   Malignant endometrial neoplasm with intraepithelial neoplasia ([place]) 07/19/2017   Tear of gluteus minimus tendon, right, initial encounter 02/22/2017   Routine general medical examination at a health care facility 10/11/2014   Sleep apnea 05/08/2012   CARDIAC MURMUR 03/30/2010   PAC (premature atrial contraction) 03/30/2010   Diabetes mellitus type 2 in obese ([place]) 02/01/2008   ELEVATED BLOOD PRESSURE WITHOUT DIAGNOSIS OF HYPERTENSION 02/01/2008    PCP: Hoyt Koch, MD  REFERRING PROVIDER: Lyndal Pulley, DO  REFERRING DIAG:  [phone] (ICD-10-CM) - Right hip pain  M54.50 (ICD-10-CM) - Low back pain, unspecified back pain laterality, unspecified chronicity, unspecified whether sciatica present    Rationale for Evaluation and Treatment Rehabilitation  THERAPY DIAG:  Other low back pain  Muscle weakness (generalized)  Pain in right hip  ONSET DATE: Summer 2023  SUBJECTIVE:  SUBJECTIVE STATEMENT:  Pt states that she has had much improved hip and back pain. She has been walking her 8 miles without pain. Sitting on a hard bleacher caused some back stiffness/soreness. Otherwise, she is doing well.     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 She has only managed it conservatively with crutches at the time. The hip is a chronic issue. The L/S is what has recently flared.   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 Pt [facility] and sits all day which she believes the root cause. Pt states that the back, neck, and hip hurt usually by the end of the work week. She states that the back pain came long after the hip pain. Pt states that the back feels more like spasming into the low back. She also has shooting pain into the neck. She will have NT into the R thigh, does not cross the knee.   Pt does some mild some resistance training and and yoga occassionally, She may only do it a few times a month. Pt has regular check ups for history of cancer/ had hysterectomy.  Pt denies other red flags.   Pt does note that she has increased pain with high stress works weeks.   PERTINENT HISTORY:  endometrial neoplasm 2019 surgery to remove  PAIN:  Are you having pain? No: NPRS scale: 0/10 Pain location: L/S and R hip; neck Pain description: sharp stabbing,  Aggravating factors: sitting Relieving factors: walking relieves hip pain, muscle relaxer   PRECAUTIONS: None  WEIGHT BEARING RESTRICTIONS No  FALLS:  Has patient fallen in last 6 months? 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    OBJECTIVE:   DIAGNOSTIC FINDINGS:  Hip X ray IMPRESSION: 1. Mild bilateral hip osteoarthritis. 2. Mild enthesopathic change at the right greater trochanter  Lumbar IMPRESSION: Degenerative change in the lumbar spine with facet hypertrophy and mild L1-L2 disc space narrowing.   PATIENT SURVEYS:  Oswestry Low Back Pain Disability Questionnaire: 14 / 50 = 28.0 %    TODAY'S TREATMENT   Nu-step Lvl6 6 min  Exercises  Shuttle leg press 125lbs 3x8 Box squat 5lbs 3x6 Seated cable row 40 lbs 2x8 Cable chop 2x10 10  lbs 15lb RDL 2x10 at wall   Review of current HEP and modifications/progression/regressions to make when exercising  - Full Leg Press  - 1 x daily - 3 x weekly - 3 sets - 8 reps - RDL/Half Deadlift  - 1 x daily - 3 x weekly - 3 sets - 8 reps - Standing Diagonal Chop  - 1 x daily - 3 x weekly - 3 sets - 10 reps - Squat with Chair Touch  - 1 x daily - 3 x weekly - 4 sets - 6 reps - Side Stepping with Resistance at Ankles  - 1 x daily - 3 x weekly - 1 sets - 2 reps - 74f hold - Standing Anti-Rotation Press with Anchored Resistance  - 1 x daily - 3 x weekly - 2 sets - 10 reps - Supine Lower Trunk Rotation  - 2 x daily - 7 x weekly - 2 sets - 10 reps - 2 hold -  Jefferson Curl  - 2 x daily - 7 x weekly - 1 sets - 10 reps - Shoulder External Rotation and Scapular Retraction with Resistance  - 2-3 x daily - 7 x weekly - 2 sets - 10 reps - Seated Cervical Retraction  - 2-3 x daily - 7 x weekly - 1 sets - 10 reps      PATIENT EDUCATION:  Education details: anatomy, exercise progression, DOMS expectations, muscle firing,  envelope of function, HEP, POC  Person educated: Patient Education method: Explanation, Demonstration, Tactile cues, Verbal cues, and Handouts Education comprehension: verbalized understanding, returned demonstration, verbal cues required, and tactile cues required   HOME EXERCISE PROGRAM: Access Code: [email or web address] (prefers electronic copy) URL: [email or web address] Date: 01/04/2022 Prepared by: Daleen Bo  ASSESSMENT:  CLINICAL IMPRESSION: Patient is able to start gym-based strengthening at today's session with expected verbal and tactile cueing needed in order to correct for technique and position.  Patient able to perform isolation type exercise as well as large, compound movements today without increasing SI joint or low back pain.  Patient encouraged to begin exercise program whether in the gym or with yoga. Pt advised on slow graded progression for return  to activity. Pt and PT agree next visit likely to D/C given significant improvements and pt knowledge of return to self directed exercise. Pt to visit with MD tomrrow for hip follow up. Pt would benefit from continued skilled therapy in order to reach goals and maximize functional lumbopelvic strength and ROM for prevention of further functional decline.   OBJECTIVE IMPAIRMENTS Abnormal gait, decreased activity tolerance, decreased endurance, decreased mobility, difficulty walking, decreased ROM, decreased strength, hypomobility, increased muscle spasms, impaired flexibility, improper body mechanics, postural dysfunction, and pain.    ACTIVITY LIMITATIONS carrying, lifting, bending, sitting, standing, squatting, sleeping, stairs, transfers, and locomotion level   PARTICIPATION LIMITATIONS: cleaning, laundry, interpersonal relationship, driving, shopping, community activity, occupation, and exercise   PERSONAL FACTORS Age, Fitness, Time since onset of injury/illness/exacerbation, and 1-2 comorbidities:    are also affecting patient's functional outcome.    REHAB POTENTIAL: Fair     CLINICAL DECISION MAKING: Stable/uncomplicated   EVALUATION COMPLEXITY: Low     GOALS:     SHORT TERM GOALS: Target date: 04/13/2022    Pt will become independent with HEP in order to demonstrate synthesis of PT education.     Goal status: met   2.  Pt will report at least 2 pt reduction on NPRS scale for pain in order to demonstrate functional improvement with household activity, self care, and ADL.    Goal status: met   3.  Pt will demonstrate at least a 12.8 improvement in Oswestry Index in order to demonstrate a clinically significant change in LBP and function.    Goal status: INITIAL     LONG TERM GOALS: Target date: 05/31/2022    Pt  will become independent with final HEP in order to demonstrate synthesis of PT education.    Goal status: ongoing   2. Pt will be able to demonstrate ability  to ascend and descend stairs without bilateral hip drop and pain in order to demonstrate functional improvement in LE function for self-care and house hold duties.    Goal status: ongoing   3.  Pt will be able to demonstrate/report ability to sit/stand/sleep for extended periods of time without pain in order to demonstrate functional improvement and tolerance to static positioning.    Goal status: met   4.  Pt will be able  to demonstrate/report ability to walk >30 mins without pain in order to demonstrate functional improvement and tolerance to exercise and community mobility.    Goal status: met     PLAN: PT FREQUENCY: 1-2x/week   PT DURATION: 12 weeks (likely D/C in 8 wks)   PLANNED INTERVENTIONS: Therapeutic exercises, Therapeutic activity, Neuromuscular re-education, Balance training, Gait training, Patient/Family education, Joint manipulation, Joint mobilization, Stair training, Orthotic/Fit training, DME instructions, Aquatic Therapy, Dry Needling, Electrical stimulation, Spinal manipulation, Spinal mobilization, Cryotherapy, Moist heat, scar mobilization, Splintting, Taping, Vasopneumatic device, Traction, Ultrasound, Ionotophoresis 61m/ml Dexamethasone, Manual therapy, and Re-evaluation.   PLAN FOR NEXT SESSION: progress hip strength, progress hip ext stretching, progress hip hinging/loaded hip hinging    ADaleen BoPT, DPT 03/02/22 4:39 PM

## 2022-03-03 ENCOUNTER — Ambulatory Visit (INDEPENDENT_AMBULATORY_CARE_PROVIDER_SITE_OTHER): Payer: BC Managed Care – PPO | Admitting: Family Medicine

## 2022-03-03 ENCOUNTER — Ambulatory Visit: Payer: Self-pay

## 2022-03-03 VITALS — BP 122/86 | HR 63 | Ht 64.0 in

## 2022-03-03 DIAGNOSIS — S76011A Strain of muscle, fascia and tendon of right hip, initial encounter: Secondary | ICD-10-CM

## 2022-03-03 DIAGNOSIS — M25551 Pain in right hip: Secondary | ICD-10-CM

## 2022-03-03 NOTE — Assessment & Plan Note (Signed)
Patient has done remarkably well at this time.  Patient did not go to physical therapy and will be finishing up tomorrow.  We discussed core strengthening, the importance of continuing to stay active otherwise.  Make sure the patient continues to prioritize her health at this time.  Follow-up again with me in 3 months otherwise.

## 2022-03-03 NOTE — Patient Instructions (Signed)
Great to see you Get that 2 week trial Take time for yourself See me in 3 months

## 2022-03-16 ENCOUNTER — Encounter (HOSPITAL_BASED_OUTPATIENT_CLINIC_OR_DEPARTMENT_OTHER): Payer: Self-pay | Admitting: Physical Therapy

## 2022-03-16 ENCOUNTER — Ambulatory Visit (HOSPITAL_BASED_OUTPATIENT_CLINIC_OR_DEPARTMENT_OTHER): Payer: BC Managed Care – PPO | Admitting: Physical Therapy

## 2022-03-16 DIAGNOSIS — M6281 Muscle weakness (generalized): Secondary | ICD-10-CM | POA: Diagnosis not present

## 2022-03-16 DIAGNOSIS — M5459 Other low back pain: Secondary | ICD-10-CM | POA: Diagnosis not present

## 2022-03-16 DIAGNOSIS — M25551 Pain in right hip: Secondary | ICD-10-CM

## 2022-03-17 ENCOUNTER — Encounter (HOSPITAL_BASED_OUTPATIENT_CLINIC_OR_DEPARTMENT_OTHER): Payer: Self-pay | Admitting: Physical Therapy

## 2022-03-17 NOTE — Therapy (Signed)
OUTPATIENT PHYSICAL THERAPY THORACOLUMBAR TREATMENT   Patient Name: Margaret [name] MRN: [id number] DOB:[birth year], 66 y.o., female Today's Date: 03/17/2022   PT End of Session - 03/16/22 1602     Visit Number 6    Number of Visits 16    Date for PT Re-Evaluation 04/04/22    Authorization Type BCBS    PT Start Time 1600    PT Stop Time 3662    PT Time Calculation (min) 42 min    Activity Tolerance Patient tolerated treatment well    Behavior During Therapy [facility] for tasks assessed/performed                Past Medical History:  Diagnosis Date   Arthritis    Endometrial cancer ([place])    Exercise-induced asthma    Heart murmur    History of bradycardia 2011   had an episode of HR 38 at time of blood work w/ palpitations and SOB,  work-up done by cardiology (dr dalton Aundra Dubin) stress echo normal on 01/ 2012 and (12/ 2011) holter monitor with PACs with average HR 68/  07-14-2017 per pt no issues since then   Hx of colonic polyps    x3   IBS (irritable bowel syndrome)    OSA (obstructive sleep apnea) 07-14-2017 per had a new study on 07-12-2017 have not heard about results yet   per study 01-06-2005 moderate OSA (AHI 23.3/hr) used cpap, per pt lost weight withthe resolution of symptoms but has gain weight back   Type 2 diabetes mellitus ([place])    Wears glasses    Past Surgical History:  Procedure Laterality Date   colonoscopy with polypectomy  2012 approx.   DOBUTAMINE STRESS ECHO  04/2010   normal with no evidence of ischemia or infarction   LAPAROSCOPIC CHOLECYSTECTOMY  1996   PLANTAR FASCIA RELEASE     ROBOTIC ASSISTED SUPRACERVICAL HYSTERECTOMY WITH BILATERAL SALPINGO OOPHERECTOMY N/A 07/19/2017   Procedure: XI ROBOTIC ASSISTED  HYSTERECTOMY WITH BILATERAL SALPINGO OOPHORECTOMY; SENTINEL LYMPH NODE BIOPSY;  Surgeon: Everitt Amber, MD;  Location: [facility];  Service: Gynecology;  Laterality: N/A;   SENTINEL NODE BIOPSY N/A 07/19/2017   Procedure: SENTINEL NODE BIOPSY;   Surgeon: Everitt Amber, MD;  Location: [facility];  Service: Gynecology;  Laterality: N/A;   TRANSTHORACIC ECHOCARDIOGRAM  04/17/2010   ef 55%/  trivial AR and TR/ left LAE/     Patient Active Problem List   Diagnosis Date Noted   Hyperlipidemia associated with type 2 diabetes mellitus ([place]) 04/30/2019   Greater trochanteric bursitis of right hip 04/24/2018   Malignant endometrial neoplasm with intraepithelial neoplasia ([place]) 07/19/2017   Tear of gluteus minimus tendon, right, initial encounter 02/22/2017   Routine general medical examination at a health care facility 10/11/2014   Sleep apnea 05/08/2012   CARDIAC MURMUR 03/30/2010   PAC (premature atrial contraction) 03/30/2010   Diabetes mellitus type 2 in obese ([place]) 02/01/2008   ELEVATED BLOOD PRESSURE WITHOUT DIAGNOSIS OF HYPERTENSION 02/01/2008    PCP: Hoyt Koch, MD  REFERRING PROVIDER: Lyndal Pulley, DO  REFERRING DIAG:  [phone] (ICD-10-CM) - Right hip pain  M54.50 (ICD-10-CM) - Low back pain, unspecified back pain laterality, unspecified chronicity, unspecified whether sciatica present    Rationale for Evaluation and Treatment Rehabilitation  THERAPY DIAG:  Other low back pain  Muscle weakness (generalized)  Pain in right hip  ONSET DATE: Summer 2023  SUBJECTIVE:  SUBJECTIVE STATEMENT: Patient is swelling her left hip today .  She ran a 5K on Thursday.  Her right hip was sore but more muscular sore.  Overall she feels like she is doing very well.  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 She has only managed it conservatively with crutches at the time. The hip is a chronic issue. The L/S is what has recently flared.   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 Pt [facility] and sits all day which she believes the root cause. Pt states that the back, neck, and hip hurt usually by the end of the work week. She states that the back pain came long after the hip pain. Pt states that the back feels more like spasming into the low back. She also has shooting pain into the neck. She will have NT into the R thigh, does not cross the knee.   Pt does some mild some resistance training and and yoga occassionally, She may only do it a few times a month. Pt has regular check ups for history of cancer/ had hysterectomy.  Pt denies other red flags.   Pt does note that she has increased pain with high stress works weeks.   PERTINENT HISTORY:  endometrial neoplasm 2019 surgery to remove  PAIN:  Are you having pain? No: NPRS scale: 0/10 Pain location: L/S and R hip; neck Pain description: sharp stabbing,  Aggravating factors: sitting Relieving factors: walking relieves hip pain, muscle relaxer   PRECAUTIONS: None  WEIGHT BEARING RESTRICTIONS No  FALLS:  Has patient fallen in last 6 months? 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    OBJECTIVE:   DIAGNOSTIC FINDINGS:  Hip X ray IMPRESSION: 1. Mild bilateral hip osteoarthritis. 2. Mild enthesopathic change at the right greater trochanter  Lumbar IMPRESSION: Degenerative change in the lumbar spine with facet hypertrophy and mild L1-L2 disc space narrowing.   PATIENT SURVEYS:  Oswestry Low Back Pain Disability Questionnaire: 14 / 50 = 28.0 %    TODAY'S TREATMENT   Nu-step Lvl6 6 min Reviewed final HEP. Reviewed self soft tissue mobilization with tennis ball. Gluteal stretch 3 x 20 seconds Monster walk 3 x 10 green band Lateral band walk 3 x  10 Reviewed how to use HEP at home Single leg stance 3x20 sec hold  Single leg reach 2x10       Last visit: Exercises  Shuttle leg press 125lbs 3x8 Box squat 5lbs 3x6 Seated cable row 40 lbs 2x8 Cable chop 2x10 10 lbs 15lb RDL 2x10 at wall   Review of current HEP and modifications/progression/regressions to make when exercising  - Full Leg Press  - 1 x daily - 3 x weekly - 3 sets - 8 reps - RDL/Half Deadlift  - 1 x daily - 3 x weekly - 3 sets - 8 reps - Standing Diagonal Chop  - 1 x daily - 3 x weekly - 3 sets - 10 reps - Squat with Chair Touch  - 1 x daily - 3 x weekly - 4 sets - 6 reps - Side Stepping with Resistance at Ankles  - 1 x daily - 3 x weekly - 1 sets -  2 reps - 62f hold - Standing Anti-Rotation Press with Anchored Resistance  - 1 x daily - 3 x weekly - 2 sets - 10 reps - Supine Lower Trunk Rotation  - 2 x daily - 7 x weekly - 2 sets - 10 reps - 2 hold - Jefferson Curl  - 2 x daily - 7 x weekly - 1 sets - 10 reps - Shoulder External Rotation and Scapular Retraction with Resistance  - 2-3 x daily - 7 x weekly - 2 sets - 10 reps - Seated Cervical Retraction  - 2-3 x daily - 7 x weekly - 1 sets - 10 reps      PATIENT EDUCATION:  Education details: anatomy, exercise progression, DOMS expectations, muscle firing,  envelope of function, HEP, POC  Person educated: Patient Education method: Explanation, Demonstration, Tactile cues, Verbal cues, and Handouts Education comprehension: verbalized understanding, returned demonstration, verbal cues required, and tactile cues required   HOME EXERCISE PROGRAM: Access Code: [email or web address](prefers electronic copy) URL: [email or web address] Date: 01/04/2022 Prepared by: ADaleen Bo ASSESSMENT:  CLINICAL IMPRESSION: The patient has progressed well. She is having some pain but nothing significant. We reviewed stretching and soft tissue mobilization for home. We also reviewed exercises and progression of  exercises. We expanded single leg stance and band walk activity for home. She hasd some soreness in her left hip after running a race on Thanksgiving. She feels comfortable with her HEP. We will D/C at this time. See below for goal specific progress.   OBJECTIVE IMPAIRMENTS Abnormal gait, decreased activity tolerance, decreased endurance, decreased mobility, difficulty walking, decreased ROM, decreased strength, hypomobility, increased muscle spasms, impaired flexibility, improper body mechanics, postural dysfunction, and pain.    ACTIVITY LIMITATIONS carrying, lifting, bending, sitting, standing, squatting, sleeping, stairs, transfers, and locomotion level   PARTICIPATION LIMITATIONS: cleaning, laundry, interpersonal relationship, driving, shopping, community activity, occupation, and exercise   PERSONAL FACTORS Age, Fitness, Time since onset of injury/illness/exacerbation, and 1-2 comorbidities:    are also affecting patient's functional outcome.    REHAB POTENTIAL: Fair     CLINICAL DECISION MAKING: Stable/uncomplicated   EVALUATION COMPLEXITY: Low     GOALS:     SHORT TERM GOALS: Target date: 04/28/2022    Pt will become independent with HEP in order to demonstrate synthesis of PT education.     Goal status: met   2.  Pt will report at least 2 pt reduction on NPRS scale for pain in order to demonstrate functional improvement with household activity, self care, and ADL.    Goal status: met   3.  Pt will demonstrate at least a 12.8 improvement in Oswestry Index in order to demonstrate a clinically significant change in LBP and function.    Goal status: INITIAL     LONG TERM GOALS: Target date: 06/15/2022    Pt  will become independent with final HEP in order to demonstrate synthesis of PT education.    Goal status: ongoing   2. Pt will be able to demonstrate ability to ascend and descend stairs without bilateral hip drop and pain in order to demonstrate functional  improvement in LE function for self-care and house hold duties.    Goal status: ongoing   3.  Pt will be able to demonstrate/report ability to sit/stand/sleep for extended periods of time without pain in order to demonstrate functional improvement and tolerance to static positioning.    Goal status: met   4.  Pt will be able to demonstrate/report ability to  walk >30 mins without pain in order to demonstrate functional improvement and tolerance to exercise and community mobility.    Goal status: met     PLAN: PT FREQUENCY: 1-2x/week   PT DURATION: 12 weeks (likely D/C in 8 wks)   PLANNED INTERVENTIONS: Therapeutic exercises, Therapeutic activity, Neuromuscular re-education, Balance training, Gait training, Patient/Family education, Joint manipulation, Joint mobilization, Stair training, Orthotic/Fit training, DME instructions, Aquatic Therapy, Dry Needling, Electrical stimulation, Spinal manipulation, Spinal mobilization, Cryotherapy, Moist heat, scar mobilization, Splintting, Taping, Vasopneumatic device, Traction, Ultrasound, Ionotophoresis 72m/ml Dexamethasone, Manual therapy, and Re-evaluation.   PLAN FOR NEXT SESSION: progress hip strength, progress hip ext stretching, progress hip hinging/loaded hip hinging    ADaleen BoPT, DPT 03/17/22 10:27 AM

## 2022-03-18 ENCOUNTER — Other Ambulatory Visit: Payer: Self-pay | Admitting: Internal Medicine

## 2022-03-29 DIAGNOSIS — Z01419 Encounter for gynecological examination (general) (routine) without abnormal findings: Secondary | ICD-10-CM | POA: Diagnosis not present

## 2022-03-29 DIAGNOSIS — Z1231 Encounter for screening mammogram for malignant neoplasm of breast: Secondary | ICD-10-CM | POA: Diagnosis not present

## 2022-03-29 DIAGNOSIS — Z6834 Body mass index (BMI) 34.0-34.9, adult: Secondary | ICD-10-CM | POA: Diagnosis not present

## 2022-04-16 ENCOUNTER — Other Ambulatory Visit: Payer: Self-pay | Admitting: Internal Medicine

## 2022-04-16 ENCOUNTER — Encounter: Payer: Self-pay | Admitting: Internal Medicine

## 2022-04-16 DIAGNOSIS — E1169 Type 2 diabetes mellitus with other specified complication: Secondary | ICD-10-CM

## 2022-04-19 ENCOUNTER — Encounter (HOSPITAL_COMMUNITY): Payer: Self-pay

## 2022-04-19 ENCOUNTER — Ambulatory Visit (HOSPITAL_COMMUNITY)
Admission: EM | Admit: 2022-04-19 | Discharge: 2022-04-19 | Disposition: A | Discharge status: Home / Self Care | Payer: BC Managed Care – PPO | Attending: Physician Assistant | Admitting: Physician Assistant

## 2022-04-19 DIAGNOSIS — R609 Edema, unspecified: Secondary | ICD-10-CM | POA: Diagnosis not present

## 2022-04-19 DIAGNOSIS — H9201 Otalgia, right ear: Secondary | ICD-10-CM

## 2022-04-19 MED ORDER — DOXYCYCLINE HYCLATE 100 MG PO CAPS: 100.0000 mg | ORAL_CAPSULE | Freq: Two times a day (BID) | ORAL | 0 refills | Status: AC

## 2022-04-19 NOTE — ED Triage Notes (Signed)
Pt is here for right ear drainage and swelling on the right side of face causing pain and discomfort.

## 2022-04-19 NOTE — Discharge Instructions (Signed)
Take antibiotic as prescribed Recommend gentle massage and warm compress to swollen glands. Can take ibuprofen for pain as needed. Recommend continued use of Debrox at home for earwax buildup. Keep follow-up with PCP. If you develop new or worsening symptoms before your PCP follow-up please return for evaluation.

## 2022-04-19 NOTE — ED Provider Notes (Signed)
[place]    CSN: [id number] Arrival date & time: 04/19/22  1236      History   Chief Complaint Chief Complaint  Patient presents with   Ear Drainage    swollen lymph gland - Entered by patient    HPI Margaret [name] is a 67 y.o. female.   Patient complains of right-sided facial swelling along the jawline and anterior to the right ear that started this morning.  Reports over the last 2 to 3 days she has had some discomfort with the ear and states mild drainage noted.  She denies trouble hearing.  She denies fever, congestion, sore throat, trouble swallowing.    Past Medical History:  Diagnosis Date   Arthritis    Endometrial cancer ([place])    Exercise-induced asthma    Heart murmur    History of bradycardia 2011   had an episode of HR 38 at time of blood work w/ palpitations and SOB,  work-up done by cardiology (dr dalton Aundra Dubin) stress echo normal on 01/ 2012 and (12/ 2011) holter monitor with PACs with average HR 68/  07-14-2017 per pt no issues since then   Hx of colonic polyps    x3   IBS (irritable bowel syndrome)    OSA (obstructive sleep apnea) 07-14-2017 per had a new study on 07-12-2017 have not heard about results yet   per study 01-06-2005 moderate OSA (AHI 23.3/hr) used cpap, per pt lost weight withthe resolution of symptoms but has gain weight back   Type 2 diabetes mellitus ([place])    Wears glasses     Patient Active Problem List   Diagnosis Date Noted   Hyperlipidemia associated with type 2 diabetes mellitus ([place]) 04/30/2019   Greater trochanteric bursitis of right hip 04/24/2018   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   DOBUTAMINE STRESS ECHO  04/2010   normal with no evidence of ischemia or infarction   LAPAROSCOPIC CHOLECYSTECTOMY  1996   PLANTAR FASCIA RELEASE     ROBOTIC ASSISTED SUPRACERVICAL HYSTERECTOMY WITH BILATERAL SALPINGO OOPHERECTOMY N/A 07/19/2017   Procedure: XI ROBOTIC ASSISTED  HYSTERECTOMY WITH BILATERAL SALPINGO OOPHORECTOMY; SENTINEL LYMPH NODE BIOPSY;  Surgeon: Everitt Amber, MD;  Location: [facility];  Service: Gynecology;  Laterality: N/A;   SENTINEL NODE BIOPSY N/A 07/19/2017   Procedure: SENTINEL NODE BIOPSY;  Surgeon: Everitt Amber, MD;  Location: [facility];  Service: Gynecology;  Laterality: N/A;   TRANSTHORACIC ECHOCARDIOGRAM  04/17/2010   ef 55%/  trivial AR and TR/ left LAE/      OB History   No obstetric history on file.      Home Medications    Prior to Admission medications   Medication Sig Start Date End Date Taking? Authorizing Provider  doxycycline (VIBRAMYCIN) 100 MG capsule Take 1 capsule (100 mg total) by mouth 2 (two) times daily for 5 days. 04/19/22 04/24/22 Yes Ward, Lenise Arena, PA-C  BIOTIN PO Take by mouth daily.    [provider]  EPINEPHrine 0.3 mg/0.3 mL IJ SOAJ injection  Inject 0.3 mg into the muscle as needed for anaphylaxis. 04/01/20   Hoyt Koch, MD  lisinopril (ZESTRIL) 2.5 MG tablet TAKE 1 TABLET BY MOUTH EVERY DAY 04/16/22   Hoyt Koch, MD  Melatonin 5 MG CHEW Chew 3 each by mouth as needed.    [provider]  metFORMIN (GLUCOPHAGE-XR) 500 MG 24 hr tablet TAKE 2 TABLETS BY MOUTH TWICE A DAY 03/18/22   Hoyt Koch, MD  Omega-3 Fatty Acids (FISH OIL ADULT GUMMIES PO) Take by mouth daily.    [provider]  pravastatin (PRAVACHOL) 40 MG tablet TAKE 1 TABLET BY MOUTH EVERY DAY 03/18/22   Hoyt Koch, MD  tiZANidine  (ZANAFLEX) 2 MG tablet TAKE 1 TABLET BY MOUTH EVERYDAY AT BEDTIME 01/21/22   Lyndal Pulley, DO  vitamin B-12 (CYANOCOBALAMIN) 500 MCG tablet Take 500 mcg by mouth daily.    [provider]    Family History Family History  Problem Relation Age of Onset   Colon polyps Father    COPD Father    Diabetes Father        diet controlled   Heart attack Mother 60   COPD Mother    Lung cancer Mother    Peripheral vascular disease Mother    Hypertension Brother        2 bro   Stroke Neg Hx     Social History Social History   Tobacco Use   Smoking status: Former    Types: Cigarettes   Smokeless tobacco: Never   Tobacco comments:    07-14-2017 per pt Smoked [age] ONLY as teen  Vaping Use   Vaping Use: Never used  Substance Use Topics   Alcohol use: Yes    Comment: rarely   Drug use: No     Allergies   Amoxicillin, Other, Penicillins, Macrodantin  [nitrofurantoin macrocrystal], and Paroxetine   Review of Systems Review of Systems  Constitutional:  Negative for chills and fever.  HENT:  Positive for ear discharge and facial swelling. Negative for ear pain and sore throat.   Eyes:  Negative for pain and visual disturbance.  Respiratory:  Negative for cough and shortness of breath.   Cardiovascular:  Negative for chest pain and palpitations.  Gastrointestinal:  Negative for abdominal pain and vomiting.  Genitourinary:  Negative for dysuria and hematuria.  Musculoskeletal:  Negative for arthralgias and back pain.  Skin:  Negative for color change and rash.  Neurological:  Negative for seizures and syncope.  All other systems reviewed and are negative.    Physical Exam Triage Vital Signs ED Triage Vitals  Enc Vitals Group     BP 04/19/22 1329 (!) 148/79     Pulse Rate 04/19/22 1329 89     Resp 04/19/22 1329 16     Temp 04/19/22 1329 98 F (36.7 C)     Temp Source 04/19/22 1329 Oral     SpO2 04/19/22 1329 94 %     Weight --      Height --      Head  Circumference --      Peak Flow --      Pain Score 04/19/22 1325 6     Pain Loc --      Pain Edu? --      Excl. in [place]? --    No data found.  Updated Vital Signs BP (!) 148/79   Pulse 89   Temp 98 F (36.7 C) (Oral)   Resp 16  SpO2 94%   Visual Acuity Right Eye Distance:   Left Eye Distance:   Bilateral Distance:    Right Eye Near:   Left Eye Near:    Bilateral Near:     Physical Exam Vitals and nursing note reviewed.  Constitutional:      General: She is not in acute distress.    Appearance: She is well-developed.  HENT:     Head: Normocephalic and atraumatic.     Comments: Swelling of right parotid gland.  Right TM normal.  Cerumen impaction right ear. Eyes:     Conjunctiva/sclera: Conjunctivae normal.  Cardiovascular:     Rate and Rhythm: Normal rate and regular rhythm.     Heart sounds: No murmur heard. Pulmonary:     Effort: Pulmonary effort is normal. No respiratory distress.     Breath sounds: Normal breath sounds.  Abdominal:     Palpations: Abdomen is soft.     Tenderness: There is no abdominal tenderness.  Musculoskeletal:        General: No swelling.     Cervical back: Neck supple.  Skin:    General: Skin is warm and dry.     Capillary Refill: Capillary refill takes less than 2 seconds.  Neurological:     Mental Status: She is alert.  Psychiatric:        Mood and Affect: Mood normal.      UC Treatments / Results  Labs (all labs ordered are listed, but only abnormal results are displayed) Labs Reviewed - No data to display  EKG   Radiology No results found.  Procedures Procedures (including critical care time)  Medications Ordered in UC Medications - No data to display  Initial Impression / Assessment and Plan / UC Course  I have reviewed the triage vital signs and the nursing notes.  Pertinent labs & imaging results that were available during my care of the patient were reviewed by me and considered in my medical decision  making (see chart for details).     Right-sided facial swelling, possible parotid gland. She is experiencing right sided ear pain.  Will start antibiotic. Right cerumen impaction, partial visualization of TM, mild redness.  Return precautions discussed. Final Clinical Impressions(s) / UC Diagnoses   Final diagnoses:  Parotid swelling  Right ear pain     Discharge Instructions      Take antibiotic as prescribed Recommend gentle massage and warm compress to swollen glands. Can take ibuprofen for pain as needed. Recommend continued use of Debrox at home for earwax buildup. Keep follow-up with PCP. If you develop new or worsening symptoms before your PCP follow-up please return for evaluation.     ED Prescriptions     Medication Sig Dispense Auth. Provider   doxycycline (VIBRAMYCIN) 100 MG capsule Take 1 capsule (100 mg total) by mouth 2 (two) times daily for 5 days. 10 capsule Ward, Lenise Arena, PA-C      PDMP not reviewed this encounter.   Ward, Lenise Arena, PA-C 04/19/22 [phone]

## 2022-04-20 MED ORDER — EPINEPHRINE 0.3 MG/0.3ML IJ SOAJ: 0.3000 mg | INTRAMUSCULAR | 0 refills | Status: DC | PRN

## 2022-05-05 ENCOUNTER — Encounter: Payer: Self-pay | Admitting: Internal Medicine

## 2022-05-05 ENCOUNTER — Ambulatory Visit (INDEPENDENT_AMBULATORY_CARE_PROVIDER_SITE_OTHER): Payer: BC Managed Care – PPO | Admitting: Internal Medicine

## 2022-05-05 VITALS — BP 120/100 | HR 95 | Temp 99.2°F | Ht 64.0 in | Wt 204.0 lb

## 2022-05-05 DIAGNOSIS — Z0000 Encounter for general adult medical examination without abnormal findings: Secondary | ICD-10-CM

## 2022-05-05 DIAGNOSIS — E1169 Type 2 diabetes mellitus with other specified complication: Secondary | ICD-10-CM | POA: Diagnosis not present

## 2022-05-05 DIAGNOSIS — E785 Hyperlipidemia, unspecified: Secondary | ICD-10-CM | POA: Diagnosis not present

## 2022-05-05 DIAGNOSIS — E669 Obesity, unspecified: Secondary | ICD-10-CM | POA: Diagnosis not present

## 2022-05-05 DIAGNOSIS — I10 Essential (primary) hypertension: Secondary | ICD-10-CM

## 2022-05-05 LAB — COMPREHENSIVE METABOLIC PANEL
ALT: 23 U/L (ref 0–35)
AST: 22 U/L (ref 0–37)
Albumin: 4.2 g/dL (ref 3.5–5.2)
Alkaline Phosphatase: 66 U/L (ref 39–117)
BUN: 23 mg/dL (ref 6–23)
CO2: 29 mEq/L (ref 19–32)
Calcium: 9.5 mg/dL (ref 8.4–10.5)
Chloride: 101 mEq/L (ref 96–112)
Creatinine, Ser: 0.84 mg/dL (ref 0.40–1.20)
GFR: 73.44 mL/min (ref >60.00)
Glucose, Bld: 154 mg/dL — ABNORMAL HIGH (ref 70–99)
Potassium: 4.3 mEq/L (ref 3.5–5.1)
Sodium: 140 mEq/L (ref 135–145)
Total Bilirubin: 0.2 mg/dL (ref 0.2–1.2)
Total Protein: 7 g/dL (ref 6.0–8.3)

## 2022-05-05 LAB — LIPID PANEL
Cholesterol: 191 mg/dL (ref 0–200)
HDL: 68.6 mg/dL (ref >39.00)
NonHDL: 122.07
Total CHOL/HDL Ratio: 3
Triglycerides: 301 mg/dL — ABNORMAL HIGH (ref 0.0–149.0)
VLDL: 60.2 mg/dL — ABNORMAL HIGH (ref 0.0–40.0)

## 2022-05-05 LAB — CBC
HCT: 42.5 % (ref 36.0–46.0)
Hemoglobin: 14.4 g/dL (ref 12.0–15.0)
MCHC: 34 g/dL (ref 30.0–36.0)
MCV: 81.5 fl (ref 78.0–100.0)
Platelets: 293 10*3/uL (ref 150.0–400.0)
RBC: 5.22 Mil/uL — ABNORMAL HIGH (ref 3.87–5.11)
RDW: 13.6 % (ref 11.5–15.5)
WBC: 7.3 10*3/uL (ref 4.0–10.5)

## 2022-05-05 LAB — MICROALBUMIN / CREATININE URINE RATIO
Creatinine,U: 76.5 mg/dL
Microalb Creat Ratio: 0.9 mg/g (ref 0.0–30.0)
Microalb, Ur: 0.7 mg/dL (ref 0.0–1.9)

## 2022-05-05 LAB — HEMOGLOBIN A1C: Hgb A1c MFr Bld: 7.8 % — ABNORMAL HIGH (ref 4.6–6.5)

## 2022-05-05 LAB — HM DIABETES EYE EXAM

## 2022-05-05 LAB — LDL CHOLESTEROL, DIRECT: Direct LDL: 105 mg/dL

## 2022-05-05 MED ORDER — LISINOPRIL 5 MG PO TABS: 5.0000 mg | ORAL_TABLET | Freq: Every day | ORAL | 1 refills | Status: DC

## 2022-05-05 NOTE — Assessment & Plan Note (Signed)
Checking lipid panel and adjust as needed pravastatin 40 mg daily. Goal LDL<100.

## 2022-05-05 NOTE — Assessment & Plan Note (Signed)
Checking HgA1c, microalbumin to creatinine ratio and lipid panel and CMP. Adjust metformin 1000 mg daily as needed. On ACE-I and statin.

## 2022-05-05 NOTE — Assessment & Plan Note (Signed)
BP elevated today partially due to increased stress in life. We will increase lisinopril to 5 mg daily and follow up close in 3 months. She will start checking BP at home. Checking CMP.

## 2022-05-05 NOTE — Assessment & Plan Note (Signed)
Flu shot up to date. Covid-19 counseled. Shingrix complete. Tetanus up to date. Colonoscopy up to date getting records. Mammogram up to date with gyn, pap smear up to date with gyn. Counseled about sun safety and mole surveillance. Counseled about the dangers of distracted driving. Given 10 year screening recommendations.

## 2022-05-05 NOTE — Patient Instructions (Addendum)
We will increase the lisinopril to 5 mg daily. You can take 2 pills of the 2.5 mg until you run out.

## 2022-05-05 NOTE — Progress Notes (Signed)
   Subjective:   Patient ID: Margaret [name], female    DOB: [birth year], 67 y.o.   MRN: [id number]  HPI The patient is here for physical. Colonoscopy [facility] 2023, [facility]  PMH, [facility], social history reviewed and updated  Review of Systems  Constitutional: Negative.   HENT: Negative.    Eyes: Negative.   Respiratory:  Negative for cough, chest tightness and shortness of breath.   Cardiovascular:  Negative for chest pain, palpitations and leg swelling.  Gastrointestinal:  Negative for abdominal distention, abdominal pain, constipation, diarrhea, nausea and vomiting.  Musculoskeletal: Negative.   Skin: Negative.   Neurological: Negative.   Psychiatric/Behavioral: Negative.      Objective:  Physical Exam Constitutional:      Appearance: She is well-developed.  HENT:     Head: Normocephalic and atraumatic.  Cardiovascular:     Rate and Rhythm: Normal rate and regular rhythm.  Pulmonary:     Effort: Pulmonary effort is normal. No respiratory distress.     Breath sounds: Normal breath sounds. No wheezing or rales.  Abdominal:     General: Bowel sounds are normal. There is no distension.     Palpations: Abdomen is soft.     Tenderness: There is no abdominal tenderness. There is no rebound.  Musculoskeletal:     Cervical back: Normal range of motion.  Skin:    General: Skin is warm and dry.  Neurological:     Mental Status: She is alert and oriented to person, place, and time.     Coordination: Coordination normal.     Vitals:   05/05/22 0817 05/05/22 0825  BP: (!) 120/100 (!) 120/100  Pulse: 95   Temp: 99.2 F (37.3 C)   TempSrc: Oral   SpO2: 96%   Weight: 204 lb (92.5 kg)   Height: '5\' 4"'$  (1.626 m)     Assessment & Plan:

## 2022-05-07 ENCOUNTER — Encounter: Payer: Self-pay | Admitting: Internal Medicine

## 2022-05-07 DIAGNOSIS — K219 Gastro-esophageal reflux disease without esophagitis: Secondary | ICD-10-CM | POA: Diagnosis not present

## 2022-05-07 DIAGNOSIS — R1319 Other dysphagia: Secondary | ICD-10-CM | POA: Diagnosis not present

## 2022-05-08 DIAGNOSIS — Z6833 Body mass index (BMI) 33.0-33.9, adult: Secondary | ICD-10-CM | POA: Diagnosis not present

## 2022-05-08 DIAGNOSIS — I10 Essential (primary) hypertension: Secondary | ICD-10-CM | POA: Diagnosis not present

## 2022-05-08 DIAGNOSIS — J029 Acute pharyngitis, unspecified: Secondary | ICD-10-CM | POA: Diagnosis not present

## 2022-05-10 ENCOUNTER — Encounter: Payer: Self-pay | Admitting: Internal Medicine

## 2022-05-12 ENCOUNTER — Ambulatory Visit: Payer: Self-pay

## 2022-05-12 ENCOUNTER — Ambulatory Visit (HOSPITAL_COMMUNITY)
Admission: EM | Admit: 2022-05-12 | Discharge: 2022-05-12 | Disposition: A | Discharge status: Home / Self Care | Payer: BC Managed Care – PPO | Attending: Emergency Medicine | Admitting: Emergency Medicine

## 2022-05-12 ENCOUNTER — Encounter (HOSPITAL_COMMUNITY): Payer: Self-pay | Admitting: Emergency Medicine

## 2022-05-12 ENCOUNTER — Other Ambulatory Visit: Payer: Self-pay

## 2022-05-12 DIAGNOSIS — J0140 Acute pansinusitis, unspecified: Secondary | ICD-10-CM | POA: Diagnosis not present

## 2022-05-12 MED ORDER — PREDNISONE 20 MG PO TABS: 40.0000 mg | ORAL_TABLET | Freq: Every day | ORAL | 0 refills | Status: DC

## 2022-05-12 MED ORDER — AZITHROMYCIN 250 MG PO TABS: 250.0000 mg | ORAL_TABLET | Freq: Every day | ORAL | 0 refills | Status: DC

## 2022-05-12 MED ORDER — ALUM & MAG HYDROXIDE-SIMETH 200-200-20 MG/5ML PO SUSP
5.0000 mL | Freq: Four times a day (QID) | ORAL | 0 refills | Status: DC | PRN
Start: 2022-05-12 — End: 2022-09-20

## 2022-05-12 NOTE — ED Provider Notes (Signed)
[facility]    CSN: [id number] Arrival date & time: 05/12/22  1754      History   Chief Complaint Chief Complaint  Patient presents with   URI    HPI Margaret [name] is a 67 y.o. female.   Patient presents for evaluation of low-grade fever, nasal congestion, rhinorrhea, sinus pain and pressure, bilateral ear pain and sore throat present for 3 weeks.  Was evaluated in urgent care on April 19, 2022, diagnosed with parotid tonsillitis, prescribed doxycycline, completed medication as prescribed, endorses improvement with swelling but pain has persisted.  Additionally has attempted use of Tylenol, salt water rinses, Chloraseptic spray and throat lozenges which have been ineffective.  Has been able to tolerate food and liquids.  Denies coughing, abdominal pain, nausea vomiting or diarrhea.  No sick contacts prior.  Past Medical History:  Diagnosis Date   Arthritis    Endometrial cancer ([place])    Exercise-induced asthma    Heart murmur    History of bradycardia 2011   had an episode of HR 38 at time of blood work w/ palpitations and SOB,  work-up done by cardiology (dr dalton Aundra Dubin) stress echo normal on 01/ 2012 and (12/ 2011) holter monitor with PACs with average HR 68/  07-14-2017 per pt no issues since then   Hx of colonic polyps    x3   IBS (irritable bowel syndrome)    OSA (obstructive sleep apnea) 07-14-2017 per had a new study on 07-12-2017 have not heard about results yet   per study 01-06-2005 moderate OSA (AHI 23.3/hr) used cpap, per pt lost weight withthe resolution of symptoms but has gain weight back   Type 2 diabetes mellitus ([place])    Wears glasses     Patient Active Problem List   Diagnosis Date Noted   Hyperlipidemia associated with type 2 diabetes mellitus ([place]) 04/30/2019   Greater trochanteric bursitis of right hip 04/24/2018   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   DOBUTAMINE STRESS ECHO  04/2010   normal with no evidence of ischemia or infarction   LAPAROSCOPIC CHOLECYSTECTOMY  1996   PLANTAR FASCIA RELEASE     ROBOTIC ASSISTED SUPRACERVICAL HYSTERECTOMY WITH BILATERAL SALPINGO OOPHERECTOMY N/A 07/19/2017   Procedure: XI ROBOTIC ASSISTED  HYSTERECTOMY WITH BILATERAL SALPINGO OOPHORECTOMY; SENTINEL LYMPH NODE BIOPSY;  Surgeon: Everitt Amber, MD;  Location: [facility];  Service: Gynecology;  Laterality: N/A;   SENTINEL NODE BIOPSY N/A 07/19/2017   Procedure: SENTINEL NODE BIOPSY;  Surgeon: Everitt Amber, MD;  Location: [facility];  Service: Gynecology;  Laterality: N/A;   TRANSTHORACIC ECHOCARDIOGRAM  04/17/2010   ef 55%/  trivial AR and TR/ left LAE/      OB History   No obstetric history on file.      Home Medications    Prior to Admission medications   Medication Sig Start Date End Date Taking? Authorizing Provider  BIOTIN PO Take by mouth daily.    [provider]  EPINEPHrine 0.3 mg/0.3 mL IJ SOAJ injection Inject 0.3 mg into the muscle as  needed for anaphylaxis. 04/20/22   Hoyt Koch, MD  lisinopril (ZESTRIL) 5 MG tablet Take 1 tablet (5 mg total) by mouth daily. 05/05/22   Hoyt Koch, MD  Melatonin 5 MG CHEW Chew 3 each by mouth as needed.    [provider]  metFORMIN (GLUCOPHAGE-XR) 500 MG 24 hr tablet TAKE 2 TABLETS BY MOUTH TWICE A DAY 03/18/22   Hoyt Koch, MD  Omega-3 Fatty Acids (FISH OIL ADULT GUMMIES PO) Take by mouth daily.    [provider]  pravastatin (PRAVACHOL) 40 MG tablet TAKE 1 TABLET  BY MOUTH EVERY DAY 03/18/22   Hoyt Koch, MD  tiZANidine (ZANAFLEX) 2 MG tablet TAKE 1 TABLET BY MOUTH EVERYDAY AT BEDTIME 01/21/22   Lyndal Pulley, DO  vitamin B-12 (CYANOCOBALAMIN) 500 MCG tablet Take 500 mcg by mouth daily.    [provider]    Family History Family History  Problem Relation Age of Onset   Heart attack Mother 40   COPD Mother    Lung cancer Mother    Peripheral vascular disease Mother    Colon polyps Father    COPD Father    Diabetes Father        diet controlled   Hypertension Brother        2 bro   Stroke Neg Hx     Social History Social History   Tobacco Use   Smoking status: Former    Types: Cigarettes   Smokeless tobacco: Never   Tobacco comments:    07-14-2017 per pt Smoked 18-19 ONLY as teen  Vaping Use   Vaping Use: Never used  Substance Use Topics   Alcohol use: Yes    Comment: rarely   Drug use: No     Allergies   Amoxicillin, Other, Penicillins, Macrodantin  [nitrofurantoin macrocrystal], and Paroxetine   Review of Systems Review of Systems  Constitutional:  Positive for fever. Negative for activity change, appetite change, chills, diaphoresis, fatigue and unexpected weight change.  HENT:  Positive for congestion, ear pain, rhinorrhea, sinus pressure, sinus pain and sore throat. Negative for dental problem, drooling, ear discharge, facial swelling, hearing loss, mouth sores, nosebleeds, postnasal drip, sneezing, tinnitus, trouble swallowing and voice change.   Respiratory: Negative.    Cardiovascular: Negative.   Gastrointestinal: Negative.   Skin: Negative.   Neurological: Negative.      Physical Exam Triage Vital Signs ED Triage Vitals  Enc Vitals Group     BP 05/12/22 1914 133/79     Pulse Rate 05/12/22 1914 83     Resp 05/12/22 1914 18     Temp 05/12/22 1914 98.4 F (36.9 C)     Temp Source 05/12/22 1914 Oral     SpO2 05/12/22 1914 96 %     Weight --      Height --      Head Circumference --       Peak Flow --      Pain Score 05/12/22 1912 8     Pain Loc --      Pain Edu? --      Excl. in [place]? --    No data found.  Updated Vital Signs BP 133/79 (BP Location: Left Arm)   Pulse 83   Temp 98.4 F (36.9 C) (Oral)   Resp 18   SpO2 96%   Visual Acuity Right Eye Distance:   Left Eye Distance:   Bilateral Distance:    Right Eye Near:  Left Eye Near:    Bilateral Near:     Physical Exam Constitutional:      Appearance: Normal appearance.  HENT:     Head: Normocephalic.     Right Ear: Tympanic membrane, ear canal and external ear normal.     Left Ear: Tympanic membrane, ear canal and external ear normal.     Nose: Congestion and rhinorrhea present.     Right Turbinates: Swollen.     Left Turbinates: Swollen.     Right Sinus: Maxillary sinus tenderness and frontal sinus tenderness present.     Left Sinus: Maxillary sinus tenderness and frontal sinus tenderness present.     Mouth/Throat:     Mouth: Mucous membranes are moist.     Pharynx: Oropharynx is clear. No posterior oropharyngeal erythema.     Tonsils: No tonsillar exudate. 0 on the right. 0 on the left.  Eyes:     Extraocular Movements: Extraocular movements intact.  Cardiovascular:     Rate and Rhythm: Normal rate and regular rhythm.     Pulses: Normal pulses.     Heart sounds: Normal heart sounds.  Pulmonary:     Effort: Pulmonary effort is normal.     Breath sounds: Normal breath sounds.  Lymphadenopathy:     Cervical: Cervical adenopathy present.  Neurological:     Mental Status: She is alert and oriented to person, place, and time. Mental status is at baseline.      UC Treatments / Results  Labs (all labs ordered are listed, but only abnormal results are displayed) Labs Reviewed - No data to display  EKG   Radiology No results found.  Procedures Procedures (including critical care time)  Medications Ordered in UC Medications - No data to display  Initial Impression / Assessment  and Plan / UC Course  I have reviewed the triage vital signs and the nursing notes.  Pertinent labs & imaging results that were available during my care of the patient were reviewed by me and considered in my medical decision making (see chart for details).  Nonrecurrent pansinusitis  Vitals are stable patient is in no signs of distress nontoxic-appearing, symptomology and presentation is consistent with a sinusitis and as symptoms have been present for 21 days without signs of resolution we will provide additional bacterial coverage, patient endorses success with Z-Pak in the past therefore prescribed at this visit additionally prescribed prednisone for general comfort as well as Magic mouthwash, may continue use of over-the-counter medications for support with urgent care or PCP follow-up as needed Final Clinical Impressions(s) / UC Diagnoses   Final diagnoses:  None   Discharge Instructions   None    ED Prescriptions   None    PDMP not reviewed this encounter.   Hans Eden, [place] 05/12/22 [phone]

## 2022-05-12 NOTE — Discharge Instructions (Signed)
Today you are being treated for a sinus infection as her symptoms have been present for 3 weeks I do believe that the bacteria is still present with along the sinus cavity causing your symptoms to prolong  Begin azithromycin Romycin as directed, generally takes about 48 hours to see improvement with antibiotics  Starting tomorrow take prednisone every morning with food for 5 days to reduce inflammation throughout the sinus cavity and ideally give you some relief with pain, you may use Tylenol in addition to this medication  You may gargle and spit Magic mouthwash solution every 6 hours as needed to provide temporary relief to your throat    You can take Tylenol and/or Ibuprofen as needed for fever reduction and pain relief.   For cough: honey 1/2 to 1 teaspoon (you can dilute the honey in water or another fluid).  You can also use guaifenesin and dextromethorphan for cough. You can use a humidifier for chest congestion and cough.  If you don't have a humidifier, you can sit in the bathroom with the hot shower running.      For sore throat: try warm salt water gargles, cepacol lozenges, throat spray, warm tea or water with lemon/honey, popsicles or ice, or OTC cold relief medicine for throat discomfort.   For congestion: take a daily anti-histamine like Zyrtec, Claritin, and a oral decongestant, such as pseudoephedrine.  You can also use Flonase 1-2 sprays in each nostril daily.   It is important to stay hydrated: drink plenty of fluids (water, gatorade/powerade/pedialyte, juices, or teas) to keep your throat moisturized and help further relieve irritation/discomfort.

## 2022-05-12 NOTE — ED Triage Notes (Signed)
Reported being seen January 1 and treated for swollen glands, patient reports swelling improved, but sore throat, bilateral ear pain, headache, sinus pressure, teeth are hurting , too  Patient was treated with doxycycline, and reports taking medicine as instructed

## 2022-05-27 NOTE — Progress Notes (Unsigned)
[place] Phone: [phone] Subjective:   [name], am serving as a scribe for Dr. Hulan Saas.  I'm seeing this patient by the request  of:  Hoyt Koch, MD  CC: Right-sided hip pain  [id number]  03/03/2022 Patient has done remarkably well at this time.  Patient did not go to physical therapy and will be finishing up tomorrow.  We discussed core strengthening, the importance of continuing to stay active otherwise.  Make sure the patient continues to prioritize her health at this time.  Follow-up again with me in 3 months otherwise     Updated 05/31/2022 Margaret [name] is a 67 y.o. female coming in with complaint of hip pain. Patient is experiencing soreness in R hip after getting back to walking while she was sick in January. Pain is not as bad as it was before.        Past Medical History:  Diagnosis Date   Arthritis    Endometrial cancer ([place])    Exercise-induced asthma    Heart murmur    History of bradycardia 2011   had an episode of HR 38 at time of blood work w/ palpitations and SOB,  work-up done by cardiology (dr dalton Aundra Dubin) stress echo normal on 01/ 2012 and (12/ 2011) holter monitor with PACs with average HR 68/  07-14-2017 per pt no issues since then   Hx of colonic polyps    x3   IBS (irritable bowel syndrome)    OSA (obstructive sleep apnea) 07-14-2017 per had a new study on 07-12-2017 have not heard about results yet   per study 01-06-2005 moderate OSA (AHI 23.3/hr) used cpap, per pt lost weight withthe resolution of symptoms but has gain weight back   Type 2 diabetes mellitus ([place])    Wears glasses    Past Surgical History:  Procedure Laterality Date   colonoscopy with polypectomy  2012 approx.   DOBUTAMINE STRESS ECHO  04/2010   normal with no evidence of ischemia or infarction   LAPAROSCOPIC CHOLECYSTECTOMY  1996   PLANTAR FASCIA RELEASE     ROBOTIC  ASSISTED SUPRACERVICAL HYSTERECTOMY WITH BILATERAL SALPINGO OOPHERECTOMY N/A 07/19/2017   Procedure: XI ROBOTIC ASSISTED  HYSTERECTOMY WITH BILATERAL SALPINGO OOPHORECTOMY; SENTINEL LYMPH NODE BIOPSY;  Surgeon: Everitt Amber, MD;  Location: [facility];  Service: Gynecology;  Laterality: N/A;   SENTINEL NODE BIOPSY N/A 07/19/2017   Procedure: SENTINEL NODE BIOPSY;  Surgeon: Everitt Amber, MD;  Location: [facility];  Service: Gynecology;  Laterality: N/A;   TRANSTHORACIC ECHOCARDIOGRAM  04/17/2010   ef 55%/  trivial AR and TR/ left LAE/     Social History   Socioeconomic History   Marital status: Divorced    Spouse name: Not on file   Number of children: Not on file   Years of education: Not on file   Highest education level: Not on file  Occupational History   Occupation: Administrator    Employer: [employer]  Tobacco Use   Smoking status: Former    Types: Cigarettes   Smokeless tobacco: Never   Tobacco comments:    07-14-2017 per pt Smoked [age] ONLY as teen  Vaping Use   Vaping Use: Never used  Substance and Sexual Activity   Alcohol use: Yes    Comment: rarely   Drug use: No   Sexual activity: Not on file  Other Topics Concern   Not on  file  Social History Narrative   Not on file   Social Determinants of Health   Financial Resource Strain: Not on file  Food Insecurity: Not on file  Transportation Needs: Not on file  Physical Activity: Not on file  Stress: Not on file  Social Connections: Not on file   Allergies  Allergen Reactions   Amoxicillin Hives   Other Anaphylaxis    TREE NUTS   Penicillins Hives and Rash    Hives Has patient had a PCN reaction causing immediate rash, facial/tongue/throat swelling, SOB or lightheadedness with hypotension: No Has patient had a PCN reaction causing severe rash involving mucus membranes or skin necrosis: No Has patient had a PCN reaction that required hospitalization: No Has patient had a PCN reaction occurring within the  last 10 years: No If all of the above answers are "NO", then may proceed with Cephalosporin use.  Hives Has patient had a PCN reaction causing immediate rash, facial/tongue/throat swelling, SOB or lightheadedness with hypotension: No Has patient had a PCN reaction causing severe rash involving mucus membranes or skin necrosis: No Has patient had a PCN reaction that required hospitalization: No Has patient had a PCN reaction occurring within the last 10 years: No If all of the above answers are "NO", then may proceed with Cephalosporin use. Hives Has patient had a PCN reaction causing immediate rash, facial/tongue/throat swelling, SOB or lightheadedness with hypotension: No Has patient had a PCN reaction causing severe rash involving mucus membranes or skin necrosis: No Has patient had a PCN reaction that required hospitalization: No Has patient had a PCN reaction occurring within the last 10 years: No If all of the above answers are "NO", then may proceed with Cephalosporin use.   Macrodantin  [Nitrofurantoin Macrocrystal] Rash   Paroxetine Other (See Comments)    Headache , malaise   Family History  Problem Relation Age of Onset   Heart attack Mother 84   COPD Mother    Lung cancer Mother    Peripheral vascular disease Mother    Colon polyps Father    COPD Father    Diabetes Father        diet controlled   Hypertension Brother        2 bro   Stroke Neg Hx     Current Outpatient Medications (Endocrine & Metabolic):    metFORMIN (GLUCOPHAGE-XR) 500 MG 24 hr tablet, TAKE 2 TABLETS BY MOUTH TWICE A DAY   predniSONE (DELTASONE) 20 MG tablet, Take 2 tablets (40 mg total) by mouth daily.  Current Outpatient Medications (Cardiovascular):    EPINEPHrine 0.3 mg/0.3 mL IJ SOAJ injection, Inject 0.3 mg into the muscle as needed for anaphylaxis.   lisinopril (ZESTRIL) 5 MG tablet, Take 1 tablet (5 mg total) by mouth daily.   pravastatin (PRAVACHOL) 40 MG tablet, TAKE 1 TABLET BY MOUTH  EVERY DAY  Current Outpatient Medications (Respiratory):    magic mouthwash (nystatin, lidocaine, diphenhydrAMINE, alum & mag hydroxide) suspension*, Swish and spit 5 mLs 4 (four) times daily as needed for mouth pain.   Current Outpatient Medications (Hematological):    vitamin B-12 (CYANOCOBALAMIN) 500 MCG tablet, Take 500 mcg by mouth daily.  Current Outpatient Medications (Other):    azithromycin (ZITHROMAX) 250 MG tablet, Take 1 tablet (250 mg total) by mouth daily. Take first 2 tablets together, then 1 every day until finished.   BIOTIN PO, Take by mouth daily.   magic mouthwash (nystatin, lidocaine, diphenhydrAMINE, alum & mag hydroxide) suspension*, Swish and spit  5 mLs 4 (four) times daily as needed for mouth pain.   Melatonin 5 MG CHEW, Chew 3 each by mouth as needed.   Omega-3 Fatty Acids (FISH OIL ADULT GUMMIES PO), Take by mouth daily.   tiZANidine (ZANAFLEX) 2 MG tablet, TAKE 1 TABLET BY MOUTH EVERYDAY AT BEDTIME * These medications belong to multiple therapeutic classes and are listed under each applicable group.   Reviewed prior external information including notes and imaging from  primary care provider As well as notes that were available from care everywhere and other healthcare systems.  Past medical history, social, surgical and family history all reviewed in electronic medical record.  No pertanent information unless stated regarding to the chief complaint.   Review of Systems:  No headache, visual changes, nausea, vomiting, diarrhea, constipation, dizziness, abdominal pain, skin rash, fevers, chills, night sweats, weight loss, swollen lymph nodes, body aches, joint swelling, chest pain, shortness of breath, mood changes. POSITIVE muscle aches  Objective  Blood pressure 108/70, pulse 94, height 5' 4"$  (1.626 m), weight 208 lb (94.3 kg), SpO2 98 %.   General: No apparent distress alert and oriented x3 mood and affect normal, dressed appropriately.  HEENT: Pupils  equal, extraocular movements intact  Respiratory: Patient's speak in full sentences and does not appear short of breath  Cardiovascular: No lower extremity edema, non tender, no erythema  Mild antalgic gait noted.  Patient does have tenderness to palpation over the greater trochanteric area right greater than left.  Negative straight leg test noted today.  Mild pain over the right sacroiliac joint.   Procedure: Real-time Ultrasound Guided Injection of right greater trochanteric bursitis secondary to patient's body habitus Device: GE Logiq Q7 Ultrasound guided injection is preferred based studies that show increased duration, increased effect, greater accuracy, decreased procedural pain, increased response rate, and decreased cost with ultrasound guided versus blind injection.  Verbal informed consent obtained.  Time-out conducted.  Noted no overlying erythema, induration, or other signs of local infection.  Skin prepped in a sterile fashion.  Local anesthesia: Topical Ethyl chloride.  With sterile technique and under real time ultrasound guidance:  Greater trochanteric area was visualized and patient's bursa was noted. A 22-gauge 3 inch needle was inserted and 4 cc of 0.5% Marcaine and 1 cc of Kenalog 40 mg/dL was injected. Pictures taken Completed without difficulty  Pain immediately resolved suggesting accurate placement of the medication.  Advised to call if fevers/chills, erythema, induration, drainage, or persistent bleeding.  Impression: Technically successful ultrasound guided injection.    Impression and Recommendations:     The above documentation has been reviewed and is accurate and complete Lyndal Pulley, DO

## 2022-05-31 ENCOUNTER — Ambulatory Visit (INDEPENDENT_AMBULATORY_CARE_PROVIDER_SITE_OTHER): Payer: BC Managed Care – PPO | Admitting: Family Medicine

## 2022-05-31 ENCOUNTER — Ambulatory Visit: Payer: Self-pay

## 2022-05-31 VITALS — BP 108/70 | HR 94 | Ht 64.0 in | Wt 208.0 lb

## 2022-05-31 DIAGNOSIS — M7061 Trochanteric bursitis, right hip: Secondary | ICD-10-CM

## 2022-05-31 DIAGNOSIS — M25551 Pain in right hip: Secondary | ICD-10-CM | POA: Diagnosis not present

## 2022-05-31 NOTE — Patient Instructions (Addendum)
Injected hip today Consider shockwave See me in 3 months

## 2022-06-01 ENCOUNTER — Encounter: Payer: Self-pay | Admitting: Family Medicine

## 2022-06-01 NOTE — Assessment & Plan Note (Signed)
Repeat injection given today and tolerated the procedure well, discussed icing regimen and home exercises, which activities improved Still avoid.  Discussed with Dr. Strengthening exercises and the importance of weight loss.  Follow-up again in 6 to 8 weeks

## 2022-06-08 DIAGNOSIS — D225 Melanocytic nevi of trunk: Secondary | ICD-10-CM | POA: Diagnosis not present

## 2022-06-08 DIAGNOSIS — L821 Other seborrheic keratosis: Secondary | ICD-10-CM | POA: Diagnosis not present

## 2022-06-08 DIAGNOSIS — L814 Other melanin hyperpigmentation: Secondary | ICD-10-CM | POA: Diagnosis not present

## 2022-06-13 ENCOUNTER — Other Ambulatory Visit: Payer: Self-pay | Admitting: Internal Medicine

## 2022-08-31 ENCOUNTER — Ambulatory Visit: Payer: BC Managed Care – PPO | Admitting: Family Medicine

## 2022-09-10 ENCOUNTER — Telehealth: Payer: Self-pay | Admitting: *Deleted

## 2022-09-10 ENCOUNTER — Encounter: Payer: Self-pay | Admitting: Gynecologic Oncology

## 2022-09-10 NOTE — Telephone Encounter (Signed)
Patient scheduled for a follow up appt on 6/5

## 2022-09-13 ENCOUNTER — Other Ambulatory Visit: Payer: Self-pay | Admitting: Internal Medicine

## 2022-09-22 ENCOUNTER — Inpatient Hospital Stay: Payer: 59 | Attending: Obstetrics & Gynecology | Admitting: Obstetrics & Gynecology

## 2022-09-22 ENCOUNTER — Other Ambulatory Visit: Payer: Self-pay

## 2022-09-22 ENCOUNTER — Encounter: Payer: Self-pay | Admitting: Obstetrics & Gynecology

## 2022-09-22 VITALS — BP 112/62 | HR 99 | Temp 98.7°F | Resp 18 | Ht 64.96 in | Wt 205.4 lb

## 2022-09-22 DIAGNOSIS — Z8542 Personal history of malignant neoplasm of other parts of uterus: Secondary | ICD-10-CM | POA: Diagnosis present

## 2022-09-22 DIAGNOSIS — C541 Malignant neoplasm of endometrium: Secondary | ICD-10-CM

## 2022-09-22 NOTE — Progress Notes (Signed)
Follow Up Note: Gyn-Onc  Margaret [name] 67 y.o. female  CC: She presents for a f/u visit   HPI: The oncology history was reviewed.  Interval History: She denies any vaginal bleeding, abdominal/pelvic pain, cough, lethargy or increasing abdominal girth.   Review of Systems  Review of Systems  Constitutional:  Negative for malaise/fatigue and weight loss.  Respiratory:  Negative for shortness of breath and wheezing.   Cardiovascular:  Negative for chest pain and leg swelling.  Gastrointestinal:  Negative for abdominal pain, blood in stool, constipation, nausea and vomiting.  Genitourinary:  Negative for dysuria, frequency, hematuria and urgency.  Musculoskeletal:  Negative for joint pain and myalgias.  Neurological:  Negative for weakness.  Psychiatric/Behavioral:  Negative for depression. The patient does not have insomnia.    Current medications, allergy, social history, past surgical history, past medical history, family history were all reviewed.    Vitals:  BP 112/62   Pulse 99   Temp 98.7 F (37.1 C) (Oral)   Resp 18   Ht 5' 4.96" (1.65 m)   Wt 205 lb 6.4 oz (93.2 kg)   SpO2 97%   BMI 34.22 kg/m     Physical Exam:   Exam conducted with a chaperone present.  Constitutional:      General: She is not in acute distress. Cardiovascular:     Rate and Rhythm: Normal rate and regular rhythm.  Pulmonary:     Effort: Pulmonary effort is normal.     Breath sounds: Normal breath sounds. No wheezing or rhonchi.  Abdominal:     Palpations: Abdomen is soft.     Tenderness: There is no abdominal tenderness. There is no right CVA tenderness or left CVA tenderness.     Hernia: No hernia is present.  Genitourinary:    General: Normal vulva.     Urethra: No urethral lesion.     Vagina: No lesions. No bleeding Musculoskeletal:     Cervical back: Neck supple.     Right lower leg: No edema.     Left lower leg: No edema.  Lymphadenopathy:     Upper Body:     Right upper  body: No supraclavicular adenopathy.     Left upper body: No supraclavicular adenopathy.     Lower Body: No right inguinal adenopathy. No left inguinal adenopathy.  Skin:    Findings: No rash.  Neurological:     Mental Status: She is oriented to person, place, and time.   Assessment/Plan:   . 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 with stage IA grade 1 endometrioid endometrial adenocarcinoma (MSI stable/normal).   Pathology revealed low risk factors for recurrence, despite ITC's seen in a [facility], therefore no adjuvant therapy is recommended according to NCCN guidelines. Negative symptom review, normal exam.  No evidence of recurrence   >return prn--follow-up w/a generalist gynecologist is appropriate   I personally spent 25 minutes face-to-face and non-face-to-face in the care of this patient, which includes all pre, intra, and post visit time on the date of service.    Antionette Char, MD

## 2022-09-22 NOTE — Assessment & Plan Note (Signed)
Ms. CAPRECIA DEWBERRY is a 66y.o. with stage IA grade 1 endometrioid endometrial adenocarcinoma (MSI stable/normal).   Pathology revealed low risk factors for recurrence, despite ITC's seen in a [facility], therefore no adjuvant therapy is recommended according to NCCN guidelines. Negative symptom review, normal exam.  No evidence of recurrence   >return prn--follow-up w/a generalist gynecologist is appropriate

## 2022-10-08 ENCOUNTER — Telehealth: Payer: 59 | Admitting: Physician Assistant

## 2022-10-08 DIAGNOSIS — U071 COVID-19: Secondary | ICD-10-CM | POA: Diagnosis not present

## 2022-10-08 MED ORDER — PROMETHAZINE-DM 6.25-15 MG/5ML PO SYRP: 5.0000 mL | ORAL_SOLUTION | Freq: Four times a day (QID) | ORAL | 0 refills | Status: DC | PRN

## 2022-10-08 MED ORDER — NIRMATRELVIR/RITONAVIR (PAXLOVID)TABLET: 3.0000 | ORAL_TABLET | Freq: Two times a day (BID) | ORAL | 0 refills | Status: AC

## 2022-10-08 NOTE — Progress Notes (Signed)
Virtual Visit Consent   Margaret [name], you are scheduled for a virtual visit with a [facility] provider today. Just as with appointments in the office, your consent must be obtained to participate. Your consent will be active for this visit and any virtual visit you may have with one of our providers in the next 365 days. If you have a MyChart account, a copy of this consent can be sent to you electronically.  As this is a virtual visit, video technology does not allow for your provider to perform a traditional examination. This may limit your provider's ability to fully assess your condition. If your provider identifies any concerns that need to be evaluated in person or the need to arrange testing (such as labs, EKG, etc.), we will make arrangements to do so. Although advances in technology are sophisticated, we cannot ensure that it will always work on either your end or our end. If the connection with a video visit is poor, the visit may have to be switched to a telephone visit. With either a video or telephone visit, we are not always able to ensure that we have a secure connection.  By engaging in this virtual visit, you consent to the provision of healthcare and authorize for your insurance to be billed (if applicable) for the services provided during this visit. Depending on your insurance coverage, you may receive a charge related to this service.  I need to obtain your verbal consent now. Are you willing to proceed with your visit today? Mellonie Guess Margaret [name] has provided verbal consent on 10/08/2022 for a virtual visit (video or telephone). [name], PA-C  Date: 10/08/2022 10:26 AM  Virtual Visit via Video Note   I, [name], connected with  Margaret [name]  ([id number], 1956/03/29) on 10/08/22 at 10:15 AM EDT by a video-enabled telemedicine application and verified that I am speaking with the correct person using two identifiers.  Location: Patient: Virtual Visit  Location Patient: Home Provider: Virtual Visit Location Provider: Home Office   I discussed the limitations of evaluation and management by telemedicine and the availability of in person appointments. The patient expressed understanding and agreed to proceed.    History of Present Illness: Margaret [name] is a 67 y.o. who identifies as a female who was assigned female at birth, and is being seen today for Covid 21.  HPI: URI  This is a new problem. Episode onset: Tested positive for Covid 19 this morning on at home test; symptoms started Wednesday. The problem has been gradually worsening. Maximum temperature: subjective fevers. The fever has been present for 1 to 2 days. Associated symptoms include congestion, coughing (dry, hacky), diarrhea, headaches, joint pain, a plugged ear sensation (bilateral), rhinorrhea, sinus pain (mild) and a sore throat. Pertinent negatives include no chest pain, ear pain, nausea, neck pain, swollen glands, vomiting or wheezing. Associated symptoms comments: Chills, myalgias. She has tried NSAIDs (aleve) for the symptoms. The treatment provided no relief.      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 (HCC) 07/19/2017   Tear of gluteus minimus tendon, right, initial encounter 02/22/2017   Routine general medical examination at a health care facility 10/11/2014   Sleep apnea 05/08/2012   CARDIAC MURMUR 03/30/2010   PAC (premature atrial contraction) 03/30/2010  Diabetes mellitus type 2 in obese 02/01/2008   Essential hypertension 02/01/2008    Allergies:  Allergies  Allergen Reactions   Amoxicillin Hives   Other Anaphylaxis    TREE NUTS   Penicillins Hives and Rash    Hives Has patient had a PCN reaction causing immediate rash,  facial/tongue/throat swelling, SOB or lightheadedness with hypotension: No Has patient had a PCN reaction causing severe rash involving mucus membranes or skin necrosis: No Has patient had a PCN reaction that required hospitalization: No Has patient had a PCN reaction occurring within the last 10 years: No If all of the above answers are "NO", then may proceed with Cephalosporin use.  Hives Has patient had a PCN reaction causing immediate rash, facial/tongue/throat swelling, SOB or lightheadedness with hypotension: No Has patient had a PCN reaction causing severe rash involving mucus membranes or skin necrosis: No Has patient had a PCN reaction that required hospitalization: No Has patient had a PCN reaction occurring within the last 10 years: No If all of the above answers are "NO", then may proceed with Cephalosporin use. Hives Has patient had a PCN reaction causing immediate rash, facial/tongue/throat swelling, SOB or lightheadedness with hypotension: No Has patient had a PCN reaction causing severe rash involving mucus membranes or skin necrosis: No Has patient had a PCN reaction that required hospitalization: No Has patient had a PCN reaction occurring within the last 10 years: No If all of the above answers are "NO", then may proceed with Cephalosporin use.   Macrodantin  [Nitrofurantoin Macrocrystal] Rash   Paroxetine Other (See Comments)    Headache , malaise   Medications:  Current Outpatient Medications:    EPINEPHrine 0.3 mg/0.3 mL IJ SOAJ injection, Inject 0.3 mg into the muscle as needed for anaphylaxis., Disp: 1 each, Rfl: 0   lisinopril (ZESTRIL) 5 MG tablet, Take 1 tablet (5 mg total) by mouth daily., Disp: 90 tablet, Rfl: 1   Melatonin 5 MG CHEW, Chew 3 each by mouth as needed., Disp: , Rfl:    metFORMIN (GLUCOPHAGE-XR) 500 MG 24 hr tablet, TAKE 2 TABLETS BY MOUTH TWICE A DAY, Disp: 360 tablet, Rfl: 1   nirmatrelvir/ritonavir (PAXLOVID) 20 x 150 MG & 10 x 100MG  TABS, Take  3 tablets by mouth 2 (two) times daily for 5 days. (Take nirmatrelvir 150 mg two tablets twice daily for 5 days and ritonavir 100 mg one tablet twice daily for 5 days) Patient GFR is 73, Disp: 30 tablet, Rfl: 0   Omega-3 Fatty Acids (FISH OIL ADULT GUMMIES PO), Take by mouth daily., Disp: , Rfl:    pravastatin (PRAVACHOL) 40 MG tablet, TAKE 1 TABLET BY MOUTH EVERY DAY, Disp: 90 tablet, Rfl: 3   promethazine-dextromethorphan (PROMETHAZINE-DM) 6.25-15 MG/5ML syrup, Take 5 mLs by mouth 4 (four) times daily as needed., Disp: 118 mL, Rfl: 0  Observations/Objective: Patient is well-developed, well-nourished in no acute distress.  Resting comfortably at home.  Head is normocephalic, atraumatic.  No labored breathing.  Speech is clear and coherent with logical content.  Patient is alert and oriented at baseline.    Assessment and Plan: 1. COVID-19 - nirmatrelvir/ritonavir (PAXLOVID) 20 x 150 MG & 10 x 100MG  TABS; Take 3 tablets by mouth 2 (two) times daily for 5 days. (Take nirmatrelvir 150 mg two tablets twice daily for 5 days and ritonavir 100 mg one tablet twice daily for 5 days) Patient GFR is 73  Dispense: 30 tablet; Refill: 0 - promethazine-dextromethorphan (PROMETHAZINE-DM) 6.25-15 MG/5ML syrup; Take 5 mLs  by mouth 4 (four) times daily as needed.  Dispense: 118 mL; Refill: 0  - Continue OTC symptomatic management of choice - Will send OTC vitamins and supplement information through AVS - Paxlovid and Promethazine DM prescribed - Patient enrolled in MyChart symptom monitoring - Push fluids - Rest as needed - Discussed return precautions and when to seek in-person evaluation, sent via AVS as well   Follow Up Instructions: I discussed the assessment and treatment plan with the patient. The patient was provided an opportunity to ask questions and all were answered. The patient agreed with the plan and demonstrated an understanding of the instructions.  A copy of instructions were sent to the  patient via MyChart unless otherwise noted below.    The patient was advised to call back or seek an in-person evaluation if the symptoms worsen or if the condition fails to improve as anticipated.  Time:  I spent 15 minutes with the patient via telehealth technology discussing the above problems/concerns.    [name], PA-C

## 2022-10-08 NOTE — Patient Instructions (Signed)
Margaret [name], thank you for joining Margaret Loveless, PA-C for today's virtual visit.  While this provider is not your primary care provider (PCP), if your PCP is located in our provider database this encounter information will be shared with them immediately following your visit.   A [facility] MyChart account gives you access to today's visit and all your visits, tests, and labs performed at [facility] " click here if you don't have a [facility] MyChart account or go to [email or web address]  Consent: (Patient) Margaret [name] provided verbal consent for this virtual visit at the beginning of the encounter.  Current Medications:  Current Outpatient Medications:    EPINEPHrine 0.3 mg/0.3 mL IJ SOAJ injection, Inject 0.3 mg into the muscle as needed for anaphylaxis., Disp: 1 each, Rfl: 0   lisinopril (ZESTRIL) 5 MG tablet, Take 1 tablet (5 mg total) by mouth daily., Disp: 90 tablet, Rfl: 1   Melatonin 5 MG CHEW, Chew 3 each by mouth as needed., Disp: , Rfl:    metFORMIN (GLUCOPHAGE-XR) 500 MG 24 hr tablet, TAKE 2 TABLETS BY MOUTH TWICE A DAY, Disp: 360 tablet, Rfl: 1   nirmatrelvir/ritonavir (PAXLOVID) 20 x 150 MG & 10 x 100MG  TABS, Take 3 tablets by mouth 2 (two) times daily for 5 days. (Take nirmatrelvir 150 mg two tablets twice daily for 5 days and ritonavir 100 mg one tablet twice daily for 5 days) Patient GFR is 73, Disp: 30 tablet, Rfl: 0   Omega-3 Fatty Acids (FISH OIL ADULT GUMMIES PO), Take by mouth daily., Disp: , Rfl:    pravastatin (PRAVACHOL) 40 MG tablet, TAKE 1 TABLET BY MOUTH EVERY DAY, Disp: 90 tablet, Rfl: 3   promethazine-dextromethorphan (PROMETHAZINE-DM) 6.25-15 MG/5ML syrup, Take 5 mLs by mouth 4 (four) times daily as needed., Disp: 118 mL, Rfl: 0   Medications ordered in this encounter:  Meds ordered this encounter  Medications   nirmatrelvir/ritonavir (PAXLOVID) 20 x 150 MG & 10 x 100MG  TABS    Sig: Take 3 tablets by mouth 2 (two) times  daily for 5 days. (Take nirmatrelvir 150 mg two tablets twice daily for 5 days and ritonavir 100 mg one tablet twice daily for 5 days) Patient GFR is 73    Dispense:  30 tablet    Refill:  0    Order Specific Question:   Supervising Provider    Answer:   Merrilee Jansky [[id number]]   promethazine-dextromethorphan (PROMETHAZINE-DM) 6.25-15 MG/5ML syrup    Sig: Take 5 mLs by mouth 4 (four) times daily as needed.    Dispense:  118 mL    Refill:  0    Order Specific Question:   Supervising Provider    Answer:   Merrilee Jansky [[id number]]     *If you need refills on other medications prior to your next appointment, please contact your pharmacy*  Follow-Up: Call back or seek an in-person evaluation if the symptoms worsen or if the condition fails to improve as anticipated.  [facility] [phone]  Care Instructions: Nirmatrelvir; Ritonavir Tablets What is this medication? NIRMATRELVIR; RITONAVIR (NIR ma TREL vir; ri TOE na veer) treats mild to moderate COVID-19. It may help people who are at high risk of developing severe illness. It works by limiting the spread of the virus in your body. This medicine may be used for other purposes; ask your health care provider or pharmacist if you have questions. COMMON BRAND NAME(S): PAXLOVID What should I tell my care  team before I take this medication? They need to know if you have any of these conditions: Any allergies Any serious illness Kidney disease Liver disease An unusual or allergic reaction to nirmatrelvir, ritonavir, other medications, foods, dyes, or preservatives Pregnant or trying to get pregnant Breast-feeding How should I use this medication? This product contains 2 different medications that are packaged together. For the standard dose, take 2 pink tablets of nirmatrelvir with 1 white tablet of ritonavir (3 tablets total) by mouth with water twice daily. Talk to your care team if you have kidney disease. You may  need a different dose. Swallow the tablets whole. You can take it with or without food. If it upsets your stomach, take it with food. Take all of this medication unless your care team tells you to stop it early. Keep taking it even if you think you are better. Talk to your care team about the use of this medication in children. While it may be prescribed for children as young as 12 years for selected conditions, precautions do apply. Overdosage: If you think you have taken too much of this medicine contact a poison control center or emergency room at once. NOTE: This medicine is only for you. Do not share this medicine with others. What if I miss a dose? If you miss a dose, take it as soon as you can unless it is more than 8 hours late. If it is more than 8 hours late, skip the missed dose. Take the next dose at the normal time. Do not take extra or 2 doses at the same time to make up for the missed dose. What may interact with this medication? Do not take this medication with any of the following: Alfuzosin Certain medications for anxiety or sleep, such as midazolam or triazolam Certain medications for cancer, such as apalutamide Certain medications for cholesterol, such as lovastatin or simvastatin Certain medications for irregular heartbeat, such as amiodarone, dronedarone, flecainide, propafenone, quinidine Certain medications for mental health conditions, such as lurasidone or pimozide Certain medications for seizures, such as carbamazepine, phenobarbital, phenytoin, primidone Colchicine Eletriptan Eplerenone Ergot alkaloids, such as dihydroergotamine, ergotamine, methylergonovine Finerenone Flibanserin Ivabradine Lomitapide Lumacaftor; ivacaftor Naloxegol Ranolazine Red Yeast Rice Rifampin Rifapentine Sildenafil Silodosin St. John's wort Tolvaptan Ubrogepant Voclosporin This medication may affect how other medications work, and other medications may affect the way this  medication works. Talk with your care team about all of the medications you take. They may suggest changes to your treatment plan to lower the risk of side effects and to make sure your medications work as intended. This list may not describe all possible interactions. Give your health care provider a list of all the medicines, herbs, non-prescription drugs, or dietary supplements you use. Also tell them if you smoke, drink alcohol, or use illegal drugs. Some items may interact with your medicine. What should I watch for while using this medication? Your condition will be monitored carefully while you are receiving this medication. Visit your care team for regular checkups. Tell your care team if your symptoms do not start to get better or if they get worse. If you have untreated HIV infection, this medication may lead to some HIV medications not working as well in the future. Estrogen and progestin hormones may not work as well while you are taking this medication. Your care team can help you find the contraceptive option that works for you. What side effects may I notice from receiving this medication? Side effects that  you should report to your care team as soon as possible: Allergic reactions--skin rash, itching, hives, swelling of the face, lips, tongue, or throat Liver injury--right upper belly pain, loss of appetite, nausea, light-colored stool, dark yellow or brown urine, yellowing skin or eyes, unusual weakness or fatigue Redness, blistering, peeling, or loosening of the skin, including inside the mouth Side effects that usually do not require medical attention (report these to your care team if they continue or are bothersome): Change in taste Diarrhea General discomfort and fatigue Increase in blood pressure Muscle pain Nausea Stomach pain This list may not describe all possible side effects. Call your doctor for medical advice about side effects. You may report side effects to FDA at  1-800-FDA-1088. Where should I keep my medication? Keep out of the reach of children and pets. Store at room temperature between 20 and 25 degrees C (68 and 77 degrees F). Get rid of any unused medication after the expiration date. To get rid of medications that are no longer needed or have expired: Take the medication to a medication take-back program. Check with your pharmacy or law enforcement to find a location. If you cannot return the medication, check the label or package insert to see if the medication should be thrown out in the garbage or flushed down the toilet. If you are not sure, ask your care team. If it is safe to put it in the trash, take the medication out of the container. Mix the medication with cat litter, dirt, coffee grounds, or other unwanted substance. Seal the mixture in a bag or container. Put it in the trash. NOTE: This sheet is a summary. It may not cover all possible information. If you have questions about this medicine, talk to your doctor, pharmacist, or health care provider.  2024 Elsevier/Gold Standard (2022-06-13 00:00:00)    Isolation Instructions: You are to isolate at home until you have been fever free for at least 24 hours without a fever-reducing medication, and symptoms have been steadily improving for 24 hours. At that time,  you can end isolation but need to mask for an additional 5 days.   If you must be around other household members who do not have symptoms, you need to make sure that both you and the family members are masking consistently with a high-quality mask.  If you note any worsening of symptoms despite treatment, please seek an in-person evaluation ASAP. If you note any significant shortness of breath or any chest pain, please seek ER evaluation. Please do not delay care!   COVID-19: What to Do if You Are Sick If you test positive and are an older adult or someone who is at high risk of getting very sick from COVID-19, treatment may be  available. Contact a healthcare provider right away after a positive test to determine if you are eligible, even if your symptoms are mild right now. You can also visit a Test to Treat location and, if eligible, receive a prescription from a provider. Don't delay: Treatment must be started within the first few days to be effective. If you have a fever, cough, or other symptoms, you might have COVID-19. Most people have mild illness and are able to recover at home. If you are sick: Keep track of your symptoms. If you have an emergency warning sign (including trouble breathing), call 911. Steps to help prevent the spread of COVID-19 if you are sick If you are sick with COVID-19 or think you might have COVID-19,  follow the steps below to care for yourself and to help protect other people in your home and community. Stay home except to get medical care Stay home. Most people with COVID-19 have mild illness and can recover at home without medical care. Do not leave your home, except to get medical care. Do not visit public areas and do not go to places where you are unable to wear a mask. Take care of yourself. Get rest and stay hydrated. Take over-the-counter medicines, such as acetaminophen, to help you feel better. Stay in touch with your doctor. Call before you get medical care. Be sure to get care if you have trouble breathing, or have any other emergency warning signs, or if you think it is an emergency. Avoid public transportation, ride-sharing, or taxis if possible. Get tested If you have symptoms of COVID-19, get tested. While waiting for test results, stay away from others, including staying apart from those living in your household. Get tested as soon as possible after your symptoms start. Treatments may be available for people with COVID-19 who are at risk for becoming very sick. Don't delay: Treatment must be started early to be effective--some treatments must begin within 5 days of your first  symptoms. Contact your healthcare provider right away if your test result is positive to determine if you are eligible. Self-tests are one of several options for testing for the virus that causes COVID-19 and may be more convenient than laboratory-based tests and point-of-care tests. Ask your healthcare provider or your local health department if you need help interpreting your test results. You can visit your state, tribal, local, and territorial health department's website to look for the latest local information on testing sites. Separate yourself from other people As much as possible, stay in a specific room and away from other people and pets in your home. If possible, you should use a separate bathroom. If you need to be around other people or animals in or outside of the home, wear a well-fitting mask. Tell your close contacts that they may have been exposed to COVID-19. An infected person can spread COVID-19 starting 48 hours (or 2 days) before the person has any symptoms or tests positive. By letting your close contacts know they may have been exposed to COVID-19, you are helping to protect everyone. See COVID-19 and Animals if you have questions about pets. If you are diagnosed with COVID-19, someone from the health department may call you. Answer the call to slow the spread. Monitor your symptoms Symptoms of COVID-19 include fever, cough, or other symptoms. Follow care instructions from your healthcare provider and local health department. Your local health authorities may give instructions on checking your symptoms and reporting information. When to seek emergency medical attention Look for emergency warning signs* for COVID-19. If someone is showing any of these signs, seek emergency medical care immediately: Trouble breathing Persistent pain or pressure in the chest New confusion Inability to wake or stay awake Pale, gray, or blue-colored skin, lips, or nail beds, depending on skin  tone *This list is not all possible symptoms. Please call your medical provider for any other symptoms that are severe or concerning to you. Call 911 or call ahead to your local emergency facility: Notify the operator that you are seeking care for someone who has or may have COVID-19. Call ahead before visiting your doctor Call ahead. Many medical visits for routine care are being postponed or done by phone or telemedicine. If you have a medical  appointment that cannot be postponed, call your doctor's office, and tell them you have or may have COVID-19. This will help the office protect themselves and other patients. If you are sick, wear a well-fitting mask You should wear a mask if you must be around other people or animals, including pets (even at home). Wear a mask with the best fit, protection, and comfort for you. You don't need to wear the mask if you are alone. If you can't put on a mask (because of trouble breathing, for example), cover your coughs and sneezes in some other way. Try to stay at least 6 feet away from other people. This will help protect the people around you. Masks should not be placed on young children under age 41 years, anyone who has trouble breathing, or anyone who is not able to remove the mask without help. Cover your coughs and sneezes Cover your mouth and nose with a tissue when you cough or sneeze. Throw away used tissues in a lined trash can. Immediately wash your hands with soap and water for at least 20 seconds. If soap and water are not available, clean your hands with an alcohol-based hand sanitizer that contains at least 60% alcohol. Clean your hands often Wash your hands often with soap and water for at least 20 seconds. This is especially important after blowing your nose, coughing, or sneezing; going to the bathroom; and before eating or preparing food. Use hand sanitizer if soap and water are not available. Use an alcohol-based hand sanitizer with at least  60% alcohol, covering all surfaces of your hands and rubbing them together until they feel dry. Soap and water are the best option, especially if hands are visibly dirty. Avoid touching your eyes, nose, and mouth with unwashed hands. Handwashing Tips Avoid sharing personal household items Do not share dishes, drinking glasses, cups, eating utensils, towels, or bedding with other people in your home. Wash these items thoroughly after using them with soap and water or put in the dishwasher. Clean surfaces in your home regularly Clean and disinfect high-touch surfaces (for example, doorknobs, tables, handles, light switches, and countertops) in your "sick room" and bathroom. In shared spaces, you should clean and disinfect surfaces and items after each use by the person who is ill. If you are sick and cannot clean, a caregiver or other person should only clean and disinfect the area around you (such as your bedroom and bathroom) on an as needed basis. Your caregiver/other person should wait as long as possible (at least several hours) and wear a mask before entering, cleaning, and disinfecting shared spaces that you use. Clean and disinfect areas that may have blood, stool, or body fluids on them. Use household cleaners and disinfectants. Clean visible dirty surfaces with household cleaners containing soap or detergent. Then, use a household disinfectant. Use a product from [employer] List N: Disinfectants for Coronavirus (COVID-19). Be sure to follow the instructions on the label to ensure safe and effective use of the product. Many products recommend keeping the surface wet with a disinfectant for a certain period of time (look at "contact time" on the product label). You may also need to wear personal protective equipment, such as gloves, depending on the directions on the product label. Immediately after disinfecting, wash your hands with soap and water for 20 seconds. For completed guidance on cleaning  and disinfecting your home, visit Complete Disinfection Guidance. Take steps to improve ventilation at home Improve ventilation (air flow) at home to  help prevent from spreading COVID-19 to other people in your household. Clear out COVID-19 virus particles in the air by opening windows, using air filters, and turning on fans in your home. Use this interactive tool to learn how to improve air flow in your home. When you can be around others after being sick with COVID-19 Deciding when you can be around others is different for different situations. Find out when you can safely end home isolation. For any additional questions about your care, contact your healthcare provider or state or local health department. 07/08/2020 Content source: [facility] ([facility]), Division of Viral Diseases This information is not intended to replace advice given to you by your health care provider. Make sure you discuss any questions you have with your health care provider. Document Revised: 08/21/2020 Document Reviewed: 08/21/2020 Elsevier Patient Education  2022 [employer].     If you have been instructed to have an in-person evaluation today at a local Urgent Care facility, please use the link below. It will take you to a list of all of our available [facility], including address, phone number and hours of operation. Please do not delay care.  [facility]  If you or a family member do not have a primary care provider, use the link below to schedule a visit and establish care. When you choose a [facility] primary care physician or advanced practice provider, you gain a long-term partner in health. Find a Primary Care Provider  Learn more about [facility]'s in-office and virtual care options: [facility] - Get Care Now

## 2022-10-25 ENCOUNTER — Telehealth: Payer: 59 | Admitting: Physician Assistant

## 2022-10-25 DIAGNOSIS — E1169 Type 2 diabetes mellitus with other specified complication: Secondary | ICD-10-CM

## 2022-10-25 DIAGNOSIS — E669 Obesity, unspecified: Secondary | ICD-10-CM

## 2022-10-25 MED ORDER — EMPAGLIFLOZIN 10 MG PO TABS: 10.0000 mg | ORAL_TABLET | Freq: Every day | ORAL | 0 refills | Status: DC

## 2022-10-25 NOTE — Progress Notes (Signed)
Virtual Visit Consent   Margaret [name], you are scheduled for a virtual visit with a [facility] provider today. Just as with appointments in the office, your consent must be obtained to participate. Your consent will be active for this visit and any virtual visit you may have with one of our providers in the next 365 days. If you have a MyChart account, a copy of this consent can be sent to you electronically.  As this is a virtual visit, video technology does not allow for your provider to perform a traditional examination. This may limit your provider's ability to fully assess your condition. If your provider identifies any concerns that need to be evaluated in person or the need to arrange testing (such as labs, EKG, etc.), we will make arrangements to do so. Although advances in technology are sophisticated, we cannot ensure that it will always work on either your end or our end. If the connection with a video visit is poor, the visit may have to be switched to a telephone visit. With either a video or telephone visit, we are not always able to ensure that we have a secure connection.  By engaging in this virtual visit, you consent to the provision of healthcare and authorize for your insurance to be billed (if applicable) for the services provided during this visit. Depending on your insurance coverage, you may receive a charge related to this service.  I need to obtain your verbal consent now. Are you willing to proceed with your visit today? Margaret [name] has provided verbal consent on 10/25/2022 for a virtual visit (video or telephone). Margaret Loveless, PA-C  Date: 10/25/2022 9:21 AM  Virtual Visit via Video Note   I, Margaret [name], connected with  Margaret [name]  ([id number], 1956/01/03) on 10/25/22 at  9:15 AM EDT by a video-enabled telemedicine application and verified that I am speaking with the correct person using two identifiers.  Location: Patient: Virtual Visit  Location Patient: Home Provider: Virtual Visit Location Provider: Home Office   I discussed the limitations of evaluation and management by telemedicine and the availability of in person appointments. The patient expressed understanding and agreed to proceed.    History of Present Illness: Margaret [name] is a 67 y.o. who identifies as a female who was assigned female at birth, and is being seen today for elevated blood sugars following Covid 19. Diagnosed with Covid 19 on October 08, 2022. Started Paxlovid and improved. Then after about a week following improvements she developed rebound Covid and lost sense of smell and taste and had worsening symptoms. She just started feeling better around Saturday, but has had her blood sugar become more elevated. Highest was 267 this morning. Now 237 after Metformin XR 1000mg  and walking. Has fasted this morning thus far.  Reports after her last bout of Covid 19 she had something similar occur but did not last as long as this one has. She did not take Steroids for Covid 19.   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 (HCC) 07/19/2017   Tear of gluteus minimus tendon, right, initial encounter 02/22/2017   Routine general medical examination at a health care facility 10/11/2014   Sleep apnea 05/08/2012   CARDIAC MURMUR 03/30/2010   PAC (premature atrial contraction) 03/30/2010  Diabetes mellitus type 2 in obese 02/01/2008   Essential hypertension 02/01/2008    Allergies:  Allergies  Allergen Reactions   Amoxicillin Hives   Other Anaphylaxis    TREE NUTS   Penicillins Hives and Rash    Hives Has patient had a PCN reaction causing immediate rash, facial/tongue/throat swelling, SOB or lightheadedness with hypotension: No Has patient had a  PCN reaction causing severe rash involving mucus membranes or skin necrosis: No Has patient had a PCN reaction that required hospitalization: No Has patient had a PCN reaction occurring within the last 10 years: No If all of the above answers are "NO", then may proceed with Cephalosporin use.  Hives Has patient had a PCN reaction causing immediate rash, facial/tongue/throat swelling, SOB or lightheadedness with hypotension: No Has patient had a PCN reaction causing severe rash involving mucus membranes or skin necrosis: No Has patient had a PCN reaction that required hospitalization: No Has patient had a PCN reaction occurring within the last 10 years: No If all of the above answers are "NO", then may proceed with Cephalosporin use. Hives Has patient had a PCN reaction causing immediate rash, facial/tongue/throat swelling, SOB or lightheadedness with hypotension: No Has patient had a PCN reaction causing severe rash involving mucus membranes or skin necrosis: No Has patient had a PCN reaction that required hospitalization: No Has patient had a PCN reaction occurring within the last 10 years: No If all of the above answers are "NO", then may proceed with Cephalosporin use.   Macrodantin  [Nitrofurantoin Macrocrystal] Rash   Paroxetine Other (See Comments)    Headache , malaise   Medications:  Current Outpatient Medications:    empagliflozin (JARDIANCE) 10 MG TABS tablet, Take 1 tablet (10 mg total) by mouth daily before breakfast., Disp: 30 tablet, Rfl: 0   EPINEPHrine 0.3 mg/0.3 mL IJ SOAJ injection, Inject 0.3 mg into the muscle as needed for anaphylaxis., Disp: 1 each, Rfl: 0   lisinopril (ZESTRIL) 5 MG tablet, Take 1 tablet (5 mg total) by mouth daily., Disp: 90 tablet, Rfl: 1   Melatonin 5 MG CHEW, Chew 3 each by mouth as needed., Disp: , Rfl:    metFORMIN (GLUCOPHAGE-XR) 500 MG 24 hr tablet, TAKE 2 TABLETS BY MOUTH TWICE A DAY, Disp: 360 tablet, Rfl: 1   Omega-3 Fatty Acids (FISH OIL  ADULT GUMMIES PO), Take by mouth daily., Disp: , Rfl:    pravastatin (PRAVACHOL) 40 MG tablet, TAKE 1 TABLET BY MOUTH EVERY DAY, Disp: 90 tablet, Rfl: 3   promethazine-dextromethorphan (PROMETHAZINE-DM) 6.25-15 MG/5ML syrup, Take 5 mLs by mouth 4 (four) times daily as needed., Disp: 118 mL, Rfl: 0  Observations/Objective: Patient is well-developed, well-nourished in no acute distress.  Resting comfortably at home.  Head is normocephalic, atraumatic.  No labored breathing.  Speech is clear and coherent with logical content.  Patient is alert and oriented at baseline.    Assessment and Plan: 1. Diabetes mellitus type 2 in obese - empagliflozin (JARDIANCE) 10 MG TABS tablet; Take 1 tablet (10 mg total) by mouth daily before breakfast.  Dispense: 30 tablet; Refill: 0  - Add Jardiance 10mg  to Metformin XR 1000mg  daily - Push fluids - Continue diabetic diet - Continue exercise - Follow up with PCP in 2-4 weeks  Follow Up Instructions: I discussed the assessment and treatment plan with the patient. The patient was provided an opportunity to ask questions and all were answered. The patient agreed with the plan and demonstrated an understanding of the instructions.  A copy of instructions were sent to the patient via MyChart unless otherwise noted below.    The patient was advised to call back or seek an in-person evaluation if the symptoms worsen or if the condition fails to improve as anticipated.  Time:  I spent 12 minutes with the patient via telehealth technology discussing the above problems/concerns.    Margaret Loveless, PA-C

## 2022-10-25 NOTE — Patient Instructions (Signed)
Margaret [name], thank you for joining Margaret Loveless, PA-C for today's virtual visit.  While this provider is not your primary care provider (PCP), if your PCP is located in our provider database this encounter information will be shared with them immediately following your visit.   A [facility] MyChart account gives you access to today's visit and all your visits, tests, and labs performed at [facility] " click here if you don't have a [facility] MyChart account or go to [email or web address]  Consent: (Patient) Margaret [name] provided verbal consent for this virtual visit at the beginning of the encounter.  Current Medications:  Current Outpatient Medications:    empagliflozin (JARDIANCE) 10 MG TABS tablet, Take 1 tablet (10 mg total) by mouth daily before breakfast., Disp: 30 tablet, Rfl: 0   EPINEPHrine 0.3 mg/0.3 mL IJ SOAJ injection, Inject 0.3 mg into the muscle as needed for anaphylaxis., Disp: 1 each, Rfl: 0   lisinopril (ZESTRIL) 5 MG tablet, Take 1 tablet (5 mg total) by mouth daily., Disp: 90 tablet, Rfl: 1   Melatonin 5 MG CHEW, Chew 3 each by mouth as needed., Disp: , Rfl:    metFORMIN (GLUCOPHAGE-XR) 500 MG 24 hr tablet, TAKE 2 TABLETS BY MOUTH TWICE A DAY, Disp: 360 tablet, Rfl: 1   Omega-3 Fatty Acids (FISH OIL ADULT GUMMIES PO), Take by mouth daily., Disp: , Rfl:    pravastatin (PRAVACHOL) 40 MG tablet, TAKE 1 TABLET BY MOUTH EVERY DAY, Disp: 90 tablet, Rfl: 3   promethazine-dextromethorphan (PROMETHAZINE-DM) 6.25-15 MG/5ML syrup, Take 5 mLs by mouth 4 (four) times daily as needed., Disp: 118 mL, Rfl: 0   Medications ordered in this encounter:  Meds ordered this encounter  Medications   empagliflozin (JARDIANCE) 10 MG TABS tablet    Sig: Take 1 tablet (10 mg total) by mouth daily before breakfast.    Dispense:  30 tablet    Refill:  0    Order Specific Question:   Supervising Provider    Answer:   Margaret [name]     *If you  need refills on other medications prior to your next appointment, please contact your pharmacy*  Follow-Up: Call back or seek an in-person evaluation if the symptoms worsen or if the condition fails to improve as anticipated.  [facility] [phone]  Other Instructions Jardiance (Empagliflozin) Tablets What is this medication? EMPAGLIFLOZIN (EM pa gli FLOE zin) treats type 2 diabetes. It works by helping your kidneys remove sugar (glucose) from your blood through the urine, which decreases your blood sugar.  It may also be used to lower the risk of worsening disease and death caused by kidney disease and heart failure. It works by helping your kidneys remove salt (sodium) from your blood through the urine. This decreases the amount of work the kidneys and heart have to do. Changes to diet and exercise are often combined with this medication. This medicine may be used for other purposes; ask your health care provider or pharmacist if you have questions. COMMON BRAND NAME(S): Jardiance What should I tell my care team before I take this medication? They need to know if you have any of these conditions: Change in diet, eating less Changes to your insulin dose Dehydration Diet low in salt Frequently drink alcohol Having surgery History of amputation History of diabetic ketoacidosis (DKA) History of pancreatitis or pancreatic surgery History of genital yeast infections Infection Kidney disease Liver disease Trouble passing urine Type 1 diabetes An  unusual or allergic reaction to empagliflozin, other medications, foods, dyes, or preservatives Pregnant or trying to get pregnant Breastfeeding How should I use this medication? Take this medication by mouth with water. Take it as directed on the prescription label at the same time every day. You may take it with or without food. Keep taking it unless your care team tells you to stop. A special MedGuide will be given to you by  the pharmacist with each prescription and refill. Be sure to read this information carefully each time. Talk to your care team about the use of this medication in children. While it may be prescribed for children as young as 10 years for selected conditions, precautions do apply. Overdosage: If you think you have taken too much of this medicine contact a poison control center or emergency room at once. NOTE: This medicine is only for you. Do not share this medicine with others. What if I miss a dose? If you miss a dose, take it as soon as you can. If it is almost time for your next dose, take only that dose. Do not take double or extra doses. What may interact with this medication? Alcohol Diuretics Insulin Lithium This list may not describe all possible interactions. Give your health care provider a list of all the medicines, herbs, non-prescription drugs, or dietary supplements you use. Also tell them if you smoke, drink alcohol, or use illegal drugs. Some items may interact with your medicine. What should I watch for while using this medication? Visit your care team for regular checks on your progress. Tell your care team if your symptoms do not start to get better or if they get worse. You may need blood work done while you are taking this medication. If you have diabetes, your care team will monitor your HbA1C (A1C). This test shows what your average blood sugar (glucose) level was over the past 2 to 3 months. This medication may increase the risk of diabetic ketoacidosis (DKA), a serious condition in which high ketone levels make your blood too acidic. Your care team may ask you to check for ketones in your urine or blood while you are taking this medication. If you develop nausea, vomiting, stomach pain, unusual weakness or fatigue, or trouble breathing, stop taking this medication and call your care team right away. Check with your care team if you have severe diarrhea, nausea, and vomiting,  or if you sweat a lot. The loss of too much body fluid may make it dangerous for you to take this medication. Know the symptoms of low blood sugar and know how to treat it. Always carry a source of quick sugar with you. Examples include hard sugar candy or glucose tablets. Make sure others know that you can choke if you eat or drink if your blood sugar is too low and you are unable to care for yourself. Get medical help at once. Tell your care team if you have high blood sugar. Your medication dose may change if your body is under stress. Some types of stress that may affect your blood sugar include fever, infection, and surgery. If you have diabetes, you may get a false-positive result for sugar in your urine while you are taking this medication. Check with your care team. What side effects may I notice from receiving this medication? Side effects that you should report to your care team as soon as possible: Allergic reactions--skin rash, itching, hives, swelling of the face, lips, tongue, or throat Dehydration--increased  thirst, dry mouth, feeling faint or lightheaded, headache, dark yellow or brown urine Diabetic ketoacidosis (DKA)--increased thirst or amount of urine, dry mouth, fatigue, fruity odor to breath, trouble breathing, stomach pain, nausea, vomiting Genital yeast infection--redness, swelling, pain, or itchiness, odor, thick or lumpy discharge New pain or tenderness, change in skin color, sores or ulcers, infection of the leg or foot Infection or redness, swelling, tenderness, or pain in the genitals, or area from the genitals to the back of the rectum Urinary tract infection (UTI)--burning when passing urine, passing frequent small amounts of urine, bloody or cloudy urine, pain in the lower back or sides This list may not describe all possible side effects. Call your doctor for medical advice about side effects. You may report side effects to FDA at 1-800-FDA-1088. Where should I keep my  medication? Keep out of the reach of children and pets. Store at room temperature between 20 and 25 degrees C (68 and 77 degrees F). Get rid of any unused medication after the expiration date. To get rid of medications that are no longer needed or have expired: Take the medication to a medication take-back program. Check with your pharmacy or law enforcement to find a location. If you cannot return the medication, check the label or package insert to see if the medication should be thrown out in the garbage or flushed down the toilet. If you are not sure, ask your care team. If it is safe to put it in the trash, take the medication out of the container. Mix the medication with cat litter, dirt, coffee grounds, or other unwanted substance. Seal the mixture in a bag or container. Put it in the trash. NOTE: This sheet is a summary. It may not cover all possible information. If you have questions about this medicine, talk to your doctor, pharmacist, or health care provider.  2024 Elsevier/Gold Standard (2022-06-13 00:00:00)    If you have been instructed to have an in-person evaluation today at a local Urgent Care facility, please use the link below. It will take you to a list of all of our available [facility], including address, phone number and hours of operation. Please do not delay care.  [facility]  If you or a family member do not have a primary care provider, use the link below to schedule a visit and establish care. When you choose a [facility] primary care physician or advanced practice provider, you gain a long-term partner in health. Find a Primary Care Provider  Learn more about [facility]'s in-office and virtual care options: [facility] - Get Care Now

## 2022-11-03 ENCOUNTER — Other Ambulatory Visit: Payer: Self-pay | Admitting: Internal Medicine

## 2022-11-03 DIAGNOSIS — E1169 Type 2 diabetes mellitus with other specified complication: Secondary | ICD-10-CM

## 2022-11-26 NOTE — Progress Notes (Unsigned)
[name] [address] [place] [address] Phone: [phone] Subjective:   Margaret [name], am serving as a scribe for Margaret. Antoine [name].  I'm seeing this patient by the request  of:  Margaret Broker, MD  CC: wrist and hip pain   [id number]  Margaret [name] is a 67 y.o. female coming in with complaint of hip and wrist pain from recent fall. Last seen for R hip pain in February. Patient states fell a couple of weeks ago. L wrist and L hip. Hip is doing better. Moving the wrist hurts. Iced when happen. Pain is about the same and over ulnar styloid.  Reviewing patient's chart was seen recently for malignant endometrial hyperplasia and neoplasm.  It appears that no adjuvant therapy is necessary.  Patient was also seen for hyperglycemia recently status post COVID in June.  Patient's last A1c was in January and was 7.8  Past Medical History:  Diagnosis Date   Arthritis    Endometrial cancer (HCC)    Exercise-induced asthma    Heart murmur    History of bradycardia 2011   had an episode of HR 38 at time of blood work w/ palpitations and SOB,  work-up done by cardiology (Margaret [name]) stress echo normal on 01/ 2012 and (12/ 2011) holter monitor with PACs with average HR 68/  07-14-2017 per pt no issues since then   Hx of colonic polyps    x3   IBS (irritable bowel syndrome)    OSA (obstructive sleep apnea) 07-14-2017 per had a new study on 07-12-2017 have not heard about results yet   per study 01-06-2005 moderate OSA (AHI 23.3/hr) used cpap, per pt lost weight withthe resolution of symptoms but has gain weight back   Type 2 diabetes mellitus (HCC)    Wears glasses    Past Surgical History:  Procedure Laterality Date   colonoscopy with polypectomy  2012 approx.   DOBUTAMINE STRESS ECHO  04/2010   normal with no evidence of ischemia or infarction   LAPAROSCOPIC CHOLECYSTECTOMY  1996   PLANTAR FASCIA RELEASE     ROBOTIC ASSISTED  SUPRACERVICAL HYSTERECTOMY WITH BILATERAL SALPINGO OOPHERECTOMY N/A 07/19/2017   Procedure: XI ROBOTIC ASSISTED  HYSTERECTOMY WITH BILATERAL SALPINGO OOPHORECTOMY; SENTINEL LYMPH NODE BIOPSY;  Surgeon: Margaret Birchwood, MD;  Location: [facility];  Service: Gynecology;  Laterality: N/A;   SENTINEL NODE BIOPSY N/A 07/19/2017   Procedure: SENTINEL NODE BIOPSY;  Surgeon: Margaret Birchwood, MD;  Location: [facility];  Service: Gynecology;  Laterality: N/A;   TRANSTHORACIC ECHOCARDIOGRAM  04/17/2010   ef 55%/  trivial AR and TR/ left LAE/     Social History   Socioeconomic History   Marital status: Divorced    Spouse name: Not on file   Number of children: Not on file   Years of education: Not on file   Highest education level: Not on file  Occupational History   Occupation: Sports coach    Employer: [employer]  Tobacco Use   Smoking status: Former    Types: Cigarettes   Smokeless tobacco: Never   Tobacco comments:    07-14-2017 per pt Smoked [age] ONLY as teen  Vaping Use   Vaping status: Never Used  Substance and Sexual Activity   Alcohol use: Yes    Comment: rarely   Drug use: No   Sexual activity: Not on file  Other Topics Concern   Not on file  Social History  Narrative   Not on file   Social Determinants of Health   Financial Resource Strain: Not on file  Food Insecurity: Not on file  Transportation Needs: Not on file  Physical Activity: Not on file  Stress: Not on file  Social Connections: Not on file   Allergies  Allergen Reactions   Amoxicillin Hives   Other Anaphylaxis    TREE NUTS   Penicillins Hives and Rash    Hives Has patient had a PCN reaction causing immediate rash, facial/tongue/throat swelling, SOB or lightheadedness with hypotension: No Has patient had a PCN reaction causing severe rash involving mucus membranes or skin necrosis: No Has patient had a PCN reaction that required hospitalization: No Has patient had a PCN reaction occurring within the last  10 years: No If all of the above answers are "NO", then may proceed with Cephalosporin use.  Hives Has patient had a PCN reaction causing immediate rash, facial/tongue/throat swelling, SOB or lightheadedness with hypotension: No Has patient had a PCN reaction causing severe rash involving mucus membranes or skin necrosis: No Has patient had a PCN reaction that required hospitalization: No Has patient had a PCN reaction occurring within the last 10 years: No If all of the above answers are "NO", then may proceed with Cephalosporin use. Hives Has patient had a PCN reaction causing immediate rash, facial/tongue/throat swelling, SOB or lightheadedness with hypotension: No Has patient had a PCN reaction causing severe rash involving mucus membranes or skin necrosis: No Has patient had a PCN reaction that required hospitalization: No Has patient had a PCN reaction occurring within the last 10 years: No If all of the above answers are "NO", then may proceed with Cephalosporin use.   Macrodantin  [Nitrofurantoin Macrocrystal] Rash   Paroxetine Other (See Comments)    Headache , malaise   Family History  Problem Relation Age of Onset   Heart attack Mother 31   COPD Mother    Lung cancer Mother    Peripheral vascular disease Mother    Colon polyps Father    COPD Father    Diabetes Father        diet controlled   Hypertension Brother        2 bro   Stroke Neg Hx     Current Outpatient Medications (Endocrine & Metabolic):    empagliflozin (JARDIANCE) 10 MG TABS tablet, Take 1 tablet (10 mg total) by mouth daily before breakfast.   metFORMIN (GLUCOPHAGE-XR) 500 MG 24 hr tablet, TAKE 2 TABLETS BY MOUTH TWICE A DAY  Current Outpatient Medications (Cardiovascular):    EPINEPHrine 0.3 mg/0.3 mL IJ SOAJ injection, Inject 0.3 mg into the muscle as needed for anaphylaxis.   lisinopril (ZESTRIL) 5 MG tablet, TAKE 1 TABLET (5 MG TOTAL) BY MOUTH DAILY.   pravastatin (PRAVACHOL) 40 MG tablet, TAKE 1  TABLET BY MOUTH EVERY DAY  Current Outpatient Medications (Respiratory):    promethazine-dextromethorphan (PROMETHAZINE-DM) 6.25-15 MG/5ML syrup, Take 5 mLs by mouth 4 (four) times daily as needed.    Current Outpatient Medications (Other):    Melatonin 5 MG CHEW, Chew 3 each by mouth as needed.   Omega-3 Fatty Acids (FISH OIL ADULT GUMMIES PO), Take by mouth daily.   Reviewed prior external information including notes and imaging from  primary care provider As well as notes that were available from care everywhere and other healthcare systems.  Past medical history, social, surgical and family history all reviewed in electronic medical record.  No pertanent information unless stated regarding  to the chief complaint.   Review of Systems:  No headache, visual changes, nausea, vomiting, diarrhea, constipation, dizziness, abdominal pain, skin rash, fevers, chills, night sweats, weight loss, swollen lymph nodes, body aches, joint swelling, chest pain, shortness of breath, mood changes. POSITIVE muscle aches  Objective  Blood pressure 102/62, pulse (!) 101, height 5\' 4"  (1.626 m), weight 208 lb (94.3 kg), SpO2 98%.   General: No apparent distress alert and oriented x3 mood and affect normal, dressed appropriately.  HEENT: Pupils equal, extraocular movements intact  Respiratory: Patient's speak in full sentences and does not appear short of breath  Cardiovascular: No lower extremity edema, non tender, no erythema  Right hip mild tenderness to palpation over the greater trochanteric area. Left hip has more tenderness to the knee than the right over the greater trochanteric Area.  Still good range of motion though otherwise. Left wrist does have some swelling noted.  Seems to be over the ulnar aspect.  Nontender over the bone itself though.  Patient does have good grip strength noted.  Mild increasing pain with ulnar deviation of the wrist.  Limited muscular skeletal ultrasound was performed  and interpreted by Margaret [name], M   Limited ultrasound of patient's left wrist does not show any cortical irregularity noted.  Patient does have what appears to be hypoechoic changes of the extensor carpi ulnaris tendon.  No true tearing appreciated. Impression: ECU subluxation   Impression and Recommendations:     The above documentation has been reviewed and is accurate and complete Judi Saa, DO

## 2022-11-29 ENCOUNTER — Other Ambulatory Visit: Payer: Self-pay

## 2022-11-29 ENCOUNTER — Encounter: Payer: Self-pay | Admitting: Internal Medicine

## 2022-11-29 ENCOUNTER — Ambulatory Visit: Payer: Medicare HMO | Admitting: Family Medicine

## 2022-11-29 ENCOUNTER — Encounter: Payer: Self-pay | Admitting: Family Medicine

## 2022-11-29 VITALS — BP 102/62 | HR 101 | Ht 64.0 in | Wt 208.0 lb

## 2022-11-29 DIAGNOSIS — M25531 Pain in right wrist: Secondary | ICD-10-CM

## 2022-11-29 DIAGNOSIS — M7062 Trochanteric bursitis, left hip: Secondary | ICD-10-CM | POA: Insufficient documentation

## 2022-11-29 DIAGNOSIS — M25532 Pain in left wrist: Secondary | ICD-10-CM | POA: Diagnosis not present

## 2022-11-29 NOTE — Assessment & Plan Note (Addendum)
The patient is having left wrist pain.  Seems to be multifactorial but more over the ulnar aspect that is secondary to subluxation of the ECU.  No cortical irregularity noted.  Given home exercises and bracing.  Discussed bracing and topical anti-inflammatories.  Follow-up again in 6 to 8 weeks should ultrasound at that time to make sure completely resolved.

## 2022-11-29 NOTE — Patient Instructions (Signed)
Coband daily for 1 week and with repetitive activity for 2 weeks Ice and arnica  See you again in 2 months

## 2022-11-29 NOTE — Assessment & Plan Note (Signed)
Patient fell and is having more of a left-sided discomfort at this time.  Discussed icing regimen and home exercises.  Discussed which activities to do and which ones to avoid.  Increase activity slowly.  Follow-up again in 6 to 8 weeks otherwise.  Worsening pain will consider injection.

## 2022-11-30 MED ORDER — LISINOPRIL 2.5 MG PO TABS: 2.5000 mg | ORAL_TABLET | Freq: Every day | ORAL | 0 refills | Status: DC

## 2022-12-02 ENCOUNTER — Ambulatory Visit: Payer: 59 | Admitting: Family Medicine

## 2022-12-14 DIAGNOSIS — D225 Melanocytic nevi of trunk: Secondary | ICD-10-CM | POA: Diagnosis not present

## 2022-12-14 DIAGNOSIS — L814 Other melanin hyperpigmentation: Secondary | ICD-10-CM | POA: Diagnosis not present

## 2022-12-14 DIAGNOSIS — L821 Other seborrheic keratosis: Secondary | ICD-10-CM | POA: Diagnosis not present

## 2022-12-22 ENCOUNTER — Ambulatory Visit: Payer: 59 | Admitting: Family Medicine

## 2022-12-23 ENCOUNTER — Other Ambulatory Visit: Payer: Self-pay | Admitting: Internal Medicine

## 2022-12-25 ENCOUNTER — Other Ambulatory Visit: Payer: Self-pay | Admitting: Internal Medicine

## 2023-01-07 DIAGNOSIS — J300 Vasomotor rhinitis: Secondary | ICD-10-CM | POA: Diagnosis not present

## 2023-01-07 DIAGNOSIS — Z008 Encounter for other general examination: Secondary | ICD-10-CM | POA: Diagnosis not present

## 2023-01-07 DIAGNOSIS — J3089 Other allergic rhinitis: Secondary | ICD-10-CM | POA: Diagnosis not present

## 2023-01-08 DIAGNOSIS — H52223 Regular astigmatism, bilateral: Secondary | ICD-10-CM | POA: Diagnosis not present

## 2023-01-08 DIAGNOSIS — E119 Type 2 diabetes mellitus without complications: Secondary | ICD-10-CM | POA: Diagnosis not present

## 2023-01-09 DIAGNOSIS — Z0100 Encounter for examination of eyes and vision without abnormal findings: Secondary | ICD-10-CM | POA: Diagnosis not present

## 2023-01-10 ENCOUNTER — Encounter: Payer: Self-pay | Admitting: Internal Medicine

## 2023-01-12 ENCOUNTER — Encounter: Payer: Self-pay | Admitting: Internal Medicine

## 2023-01-12 ENCOUNTER — Ambulatory Visit: Payer: Medicare HMO | Admitting: Internal Medicine

## 2023-01-12 VITALS — BP 126/82 | HR 91 | Temp 98.1°F | Ht 64.0 in | Wt 205.0 lb

## 2023-01-12 DIAGNOSIS — I10 Essential (primary) hypertension: Secondary | ICD-10-CM

## 2023-01-12 DIAGNOSIS — E1169 Type 2 diabetes mellitus with other specified complication: Secondary | ICD-10-CM | POA: Diagnosis not present

## 2023-01-12 DIAGNOSIS — Z7984 Long term (current) use of oral hypoglycemic drugs: Secondary | ICD-10-CM | POA: Diagnosis not present

## 2023-01-12 DIAGNOSIS — E785 Hyperlipidemia, unspecified: Secondary | ICD-10-CM

## 2023-01-12 DIAGNOSIS — Z23 Encounter for immunization: Secondary | ICD-10-CM

## 2023-01-12 DIAGNOSIS — E118 Type 2 diabetes mellitus with unspecified complications: Secondary | ICD-10-CM

## 2023-01-12 LAB — POCT GLYCOSYLATED HEMOGLOBIN (HGB A1C): HbA1c POC (<> result, manual entry): 9.6 % (ref 4.0–5.6)

## 2023-01-12 MED ORDER — PRAVASTATIN SODIUM 40 MG PO TABS: 40.0000 mg | ORAL_TABLET | Freq: Every day | ORAL | 3 refills | Status: DC

## 2023-01-12 MED ORDER — EMPAGLIFLOZIN 10 MG PO TABS: 10.0000 mg | ORAL_TABLET | Freq: Every day | ORAL | 0 refills | Status: DC

## 2023-01-12 MED ORDER — LISINOPRIL 2.5 MG PO TABS: 2.5000 mg | ORAL_TABLET | Freq: Every day | ORAL | 3 refills | Status: DC

## 2023-01-12 MED ORDER — METFORMIN HCL ER 500 MG PO TB24: 1000.0000 mg | ORAL_TABLET | Freq: Two times a day (BID) | ORAL | 3 refills | Status: DC

## 2023-01-12 MED ORDER — EMPAGLIFLOZIN 25 MG PO TABS
25.0000 mg | ORAL_TABLET | Freq: Every day | ORAL | 1 refills | Status: DC
Start: 2023-01-12 — End: 2023-06-10

## 2023-01-12 NOTE — Patient Instructions (Signed)
We will add jardiance 10 mg daily for the first month. Then increase to 25 mg daily if doing well.

## 2023-01-12 NOTE — Progress Notes (Signed)
Subjective:   Patient ID: Margaret [name], female    DOB: [birth year], 67 y.o.   MRN: [id number]  HPI The patient is a 67 YO female coming in for diabetes follow up. Hga1c around 9 over the summer and she has not been able to make changes. Prescribed jardiance by virtual provider she did not get due to cost.   Review of Systems  Constitutional: Negative.   HENT: Negative.    Eyes: Negative.   Respiratory:  Negative for cough, chest tightness and shortness of breath.   Cardiovascular:  Negative for chest pain, palpitations and leg swelling.  Gastrointestinal:  Negative for abdominal distention, abdominal pain, constipation, diarrhea, nausea and vomiting.  Musculoskeletal: Negative.   Skin: Negative.   Neurological: Negative.   Psychiatric/Behavioral: Negative.      Objective:  Physical Exam Constitutional:      Appearance: She is well-developed.  HENT:     Head: Normocephalic and atraumatic.  Cardiovascular:     Rate and Rhythm: Normal rate and regular rhythm.  Pulmonary:     Effort: Pulmonary effort is normal. No respiratory distress.     Breath sounds: Normal breath sounds. No wheezing or rales.  Abdominal:     General: Bowel sounds are normal. There is no distension.     Palpations: Abdomen is soft.     Tenderness: There is no abdominal tenderness. There is no rebound.  Musculoskeletal:     Cervical back: Normal range of motion.  Skin:    General: Skin is warm and dry.  Neurological:     Mental Status: She is alert and oriented to person, place, and time.     Coordination: Coordination normal.     Vitals:   01/12/23 0958  BP: 126/82  Pulse: 91  Temp: 98.1 F (36.7 C)  TempSrc: Oral  SpO2: 96%  Weight: 205 lb (93 kg)  Height: 5\' 4"  (1.626 m)    Assessment & Plan:  Flu and prevnar 20 given at visit

## 2023-01-12 NOTE — Assessment & Plan Note (Signed)
BP normal but lower end. Advised to monitor with addition of jardiance as this can lower BP.

## 2023-01-12 NOTE — Assessment & Plan Note (Signed)
Keep pravastatin 40 mg daily and checking lipid panel in 3 months at follow up.

## 2023-01-12 NOTE — Assessment & Plan Note (Signed)
Adding jardiance as she has coverage now. Starting with 10 mg daily for 1 month then increase to 25 mg daily. POC Hga1c done today and >9. Will keep metformin to 1000 mg BID. Is on statin and ACE-I.

## 2023-01-25 NOTE — Progress Notes (Signed)
[name] [address] [place] [address] Phone: [phone] Subjective:   Margaret [name], am serving as a scribe for Dr. Antoine Primas.  I'm seeing this patient by the request  of:  Myrlene Broker, MD  CC: Left wrist pain left hip pain but now having more right hip pain  [id number]  11/29/2022 The patient is having left wrist pain.  Seems to be multifactorial but more over the ulnar aspect that is secondary to subluxation of the ECU.  No cortical irregularity noted.  Given home exercises and bracing.  Discussed bracing and topical anti-inflammatories.  Follow-up again in 6 to 8 weeks should ultrasound at that time to make sure completely resolved.     Patient fell and is having more of a left-sided discomfort at this time.  Discussed icing regimen and home exercises.  Discussed which activities to do and which ones to avoid.  Increase activity slowly.  Follow-up again in 6 to 8 weeks otherwise.  Worsening pain will consider injection      Update 02/01/2023 Margaret [name] is a 67 y.o. female coming in with complaint of L wrist and L hip pain. Patient states that her wrist is doing a lot better.   R hip is painful over lateral aspect and into the SI joint. Feeling some pain into the groin on both sides. Pain worse after she does a lot of walking. Has been using ice, heat and tylenol.     Past Medical History:  Diagnosis Date   Arthritis    Endometrial cancer (HCC)    Exercise-induced asthma    Heart murmur    History of bradycardia 2011   had an episode of HR 38 at time of blood work w/ palpitations and SOB,  work-up done by cardiology (dr dalton Shirlee Latch) stress echo normal on 01/ 2012 and (12/ 2011) holter monitor with PACs with average HR 68/  07-14-2017 per pt no issues since then   Hx of colonic polyps    x3   IBS (irritable bowel syndrome)    OSA (obstructive sleep apnea) 07-14-2017 per had a new study on 07-12-2017 have  not heard about results yet   per study 01-06-2005 moderate OSA (AHI 23.3/hr) used cpap, per pt lost weight withthe resolution of symptoms but has gain weight back   Type 2 diabetes mellitus (HCC)    Wears glasses    Past Surgical History:  Procedure Laterality Date   colonoscopy with polypectomy  2012 approx.   DOBUTAMINE STRESS ECHO  04/2010   normal with no evidence of ischemia or infarction   LAPAROSCOPIC CHOLECYSTECTOMY  1996   PLANTAR FASCIA RELEASE     ROBOTIC ASSISTED SUPRACERVICAL HYSTERECTOMY WITH BILATERAL SALPINGO OOPHERECTOMY N/A 07/19/2017   Procedure: XI ROBOTIC ASSISTED  HYSTERECTOMY WITH BILATERAL SALPINGO OOPHORECTOMY; SENTINEL LYMPH NODE BIOPSY;  Surgeon: Adolphus Birchwood, MD;  Location: [facility];  Service: Gynecology;  Laterality: N/A;   SENTINEL NODE BIOPSY N/A 07/19/2017   Procedure: SENTINEL NODE BIOPSY;  Surgeon: Adolphus Birchwood, MD;  Location: [facility];  Service: Gynecology;  Laterality: N/A;   TRANSTHORACIC ECHOCARDIOGRAM  04/17/2010   ef 55%/  trivial AR and TR/ left LAE/     Social History   Socioeconomic History   Marital status: Divorced    Spouse name: Not on file   Number of children: Not on file   Years of education: Not on file   Highest education level: Not on file  Occupational History  Occupation: Environmental health practitioner: [employer]  Tobacco Use   Smoking status: Former    Types: Cigarettes   Smokeless tobacco: Never   Tobacco comments:    07-14-2017 per pt Smoked [age] ONLY as teen  Vaping Use   Vaping status: Never Used  Substance and Sexual Activity   Alcohol use: Yes    Comment: rarely   Drug use: No   Sexual activity: Not on file  Other Topics Concern   Not on file  Social History Narrative   Not on file   Social Determinants of Health   Financial Resource Strain: Not on file  Food Insecurity: Not on file  Transportation Needs: Not on file  Physical Activity: Not on file  Stress: Not on file  Social Connections: Not  on file   Allergies  Allergen Reactions   Amoxicillin Hives   Other Anaphylaxis    TREE NUTS   Penicillins Hives and Rash    Hives Has patient had a PCN reaction causing immediate rash, facial/tongue/throat swelling, SOB or lightheadedness with hypotension: No Has patient had a PCN reaction causing severe rash involving mucus membranes or skin necrosis: No Has patient had a PCN reaction that required hospitalization: No Has patient had a PCN reaction occurring within the last 10 years: No If all of the above answers are "NO", then may proceed with Cephalosporin use.  Hives Has patient had a PCN reaction causing immediate rash, facial/tongue/throat swelling, SOB or lightheadedness with hypotension: No Has patient had a PCN reaction causing severe rash involving mucus membranes or skin necrosis: No Has patient had a PCN reaction that required hospitalization: No Has patient had a PCN reaction occurring within the last 10 years: No If all of the above answers are "NO", then may proceed with Cephalosporin use. Hives Has patient had a PCN reaction causing immediate rash, facial/tongue/throat swelling, SOB or lightheadedness with hypotension: No Has patient had a PCN reaction causing severe rash involving mucus membranes or skin necrosis: No Has patient had a PCN reaction that required hospitalization: No Has patient had a PCN reaction occurring within the last 10 years: No If all of the above answers are "NO", then may proceed with Cephalosporin use.   Macrodantin  [Nitrofurantoin Macrocrystal] Rash   Paroxetine Other (See Comments)    Headache , malaise   Family History  Problem Relation Age of Onset   Heart attack Mother 63   COPD Mother    Lung cancer Mother    Peripheral vascular disease Mother    Colon polyps Father    COPD Father    Diabetes Father        diet controlled   Hypertension Brother        2 bro   Stroke Neg Hx     Current Outpatient Medications (Endocrine &  Metabolic):    empagliflozin (JARDIANCE) 10 MG TABS tablet, Take 1 tablet (10 mg total) by mouth daily before breakfast.   empagliflozin (JARDIANCE) 25 MG TABS tablet, Take 1 tablet (25 mg total) by mouth daily before breakfast.   metFORMIN (GLUCOPHAGE-XR) 500 MG 24 hr tablet, Take 2 tablets (1,000 mg total) by mouth 2 (two) times daily.  Current Outpatient Medications (Cardiovascular):    EPINEPHrine 0.3 mg/0.3 mL IJ SOAJ injection, Inject 0.3 mg into the muscle as needed for anaphylaxis.   lisinopril (ZESTRIL) 2.5 MG tablet, Take 1 tablet (2.5 mg total) by mouth daily.   pravastatin (PRAVACHOL) 40 MG tablet,  Take 1 tablet (40 mg total) by mouth daily.     Current Outpatient Medications (Other):    Melatonin 5 MG CHEW, Chew 3 each by mouth as needed.   Omega-3 Fatty Acids (FISH OIL ADULT GUMMIES PO), Take by mouth daily.   Reviewed prior external information including notes and imaging from  primary care provider As well as notes that were available from care everywhere and other healthcare systems.  Past medical history, social, surgical and family history all reviewed in electronic medical record.  No pertanent information unless stated regarding to the chief complaint.   Review of Systems:  No headache, visual changes, nausea, vomiting, diarrhea, constipation, dizziness, abdominal pain, skin rash, fevers, chills, night sweats, weight loss, swollen lymph nodes, body aches, joint swelling, chest pain, shortness of breath, mood changes. POSITIVE muscle aches  Objective  Blood pressure 104/68, pulse 89, height 5\' 4"  (1.626 m), weight 205 lb (93 kg), SpO2 95%.   General: No apparent distress alert and oriented x3 mood and affect normal, dressed appropriately.  HEENT: Pupils equal, extraocular movements intact  Respiratory: Patient's speak in full sentences and does not appear short of breath  Cardiovascular: No lower extremity edema, non tender, no erythema  Low back exam does have  some loss lordosis noted.  Tightness with Pearlean Brownie right greater than left.  Patient has negative straight leg test.  Severe tenderness over the right greater trochanteric area.   Procedure: Real-time Ultrasound Guided Injection of right greater trochanteric bursitis secondary to patient's body habitus Device: GE Logiq Q7 Ultrasound guided injection is preferred based studies that show increased duration, increased effect, greater accuracy, decreased procedural pain, increased response rate, and decreased cost with ultrasound guided versus blind injection.  Verbal informed consent obtained.  Time-out conducted.  Noted no overlying erythema, induration, or other signs of local infection.  Skin prepped in a sterile fashion.  Local anesthesia: Topical Ethyl chloride.  With sterile technique and under real time ultrasound guidance:  Greater trochanteric area was visualized and patient's bursa was noted. A 22-gauge 3 inch needle was inserted and 4 cc of 0.5% Marcaine and 1 cc of Kenalog 40 mg/dL was injected. Pictures taken Completed without difficulty  Pain immediately resolved suggesting accurate placement of the medication.  Advised to call if fevers/chills, erythema, induration, drainage, or persistent bleeding.  Impression: Technically successful ultrasound guided injection.    Impression and Recommendations:    The above documentation has been reviewed and is accurate and complete Judi Saa, DO

## 2023-02-01 ENCOUNTER — Other Ambulatory Visit: Payer: Self-pay

## 2023-02-01 ENCOUNTER — Ambulatory Visit: Payer: Medicare HMO | Admitting: Family Medicine

## 2023-02-01 ENCOUNTER — Encounter: Payer: Self-pay | Admitting: Family Medicine

## 2023-02-01 VITALS — BP 104/68 | HR 89 | Ht 64.0 in | Wt 205.0 lb

## 2023-02-01 DIAGNOSIS — M7061 Trochanteric bursitis, right hip: Secondary | ICD-10-CM | POA: Diagnosis not present

## 2023-02-01 DIAGNOSIS — M25532 Pain in left wrist: Secondary | ICD-10-CM

## 2023-02-01 NOTE — Assessment & Plan Note (Addendum)
Repeat injection given today, tolerated the procedure well.  Has had this pain intermittently and seems to be this time a year and usually.  Discussed avoiding certain activities.  Continue to work on core strength.  Could be a candidate for osteopathic manipulation.  Follow-up again in 6 to 8 weeks, with patient's history though of endometrial neoplasm to if any systemic symptoms occur we will consider advanced imaging.

## 2023-02-01 NOTE — Patient Instructions (Signed)
GT injection today Also tried OMT Stay active See me in 2-3 months

## 2023-03-12 ENCOUNTER — Other Ambulatory Visit: Payer: Self-pay | Admitting: Internal Medicine

## 2023-03-31 DIAGNOSIS — Z1231 Encounter for screening mammogram for malignant neoplasm of breast: Secondary | ICD-10-CM | POA: Diagnosis not present

## 2023-03-31 DIAGNOSIS — Z01419 Encounter for gynecological examination (general) (routine) without abnormal findings: Secondary | ICD-10-CM | POA: Diagnosis not present

## 2023-03-31 LAB — HM MAMMOGRAPHY

## 2023-04-08 ENCOUNTER — Other Ambulatory Visit: Payer: Self-pay | Admitting: Internal Medicine

## 2023-04-25 ENCOUNTER — Ambulatory Visit: Payer: Medicare HMO | Admitting: Internal Medicine

## 2023-05-02 NOTE — Progress Notes (Addendum)
 [name] [address] [place] [address] Phone: [phone] Subjective:   Margaret [name], am serving as a scribe for Dr. Arthea Claudene.  I'm seeing this patient by the request  of:  Rollene Almarie LABOR, MD  CC:   YEP:Dlagzrupcz  02/01/2023 Repeat injection given today, tolerated the procedure well.  Has had this pain intermittently and seems to be this time a year and usually.  Discussed avoiding certain activities.  Continue to work on core strength.  Could be a candidate for osteopathic manipulation.  Follow-up again in 6 to 8 weeks, with patient's history though of endometrial neoplasm to if any systemic symptoms occur we will consider advanced imaging.      Margaret [name] is a 68 y.o. female coming in with complaint of L wrist and R hip pain. GT injection last visit. Patient states having some R shoulder pain. Wrist and hip is doing better. Aleve is not helping with R shoulder arm pain. Pain will sometimes wake her at night. Pain is sharp and quick that can radiates down to elbow      Past Medical History:  Diagnosis Date   Arthritis    Endometrial cancer (HCC)    Exercise-induced asthma    Heart murmur    History of bradycardia 2011   had an episode of HR 38 at time of blood work w/ palpitations and SOB,  work-up done by cardiology (dr dalton rolan) stress echo normal on 01/ 2012 and (12/ 2011) holter monitor with PACs with average HR 68/  07-14-2017 per pt no issues since then   Hx of colonic polyps    x3   IBS (irritable bowel syndrome)    OSA (obstructive sleep apnea) 07-14-2017 per had a new study on 07-12-2017 have not heard about results yet   per study 01-06-2005 moderate OSA (AHI 23.3/hr) used cpap, per pt lost weight withthe resolution of symptoms but has gain weight back   Type 2 diabetes mellitus (HCC)    Wears glasses    Past Surgical History:  Procedure Laterality Date   colonoscopy with polypectomy  2012  approx.   DOBUTAMINE  STRESS ECHO  04/2010   normal with no evidence of ischemia or infarction   LAPAROSCOPIC CHOLECYSTECTOMY  1996   PLANTAR FASCIA RELEASE     ROBOTIC ASSISTED SUPRACERVICAL HYSTERECTOMY WITH BILATERAL SALPINGO OOPHERECTOMY N/A 07/19/2017   Procedure: XI ROBOTIC ASSISTED  HYSTERECTOMY WITH BILATERAL SALPINGO OOPHORECTOMY; SENTINEL LYMPH NODE BIOPSY;  Surgeon: Eloy Herring, MD;  Location: [facility];  Service: Gynecology;  Laterality: N/A;   SENTINEL NODE BIOPSY N/A 07/19/2017   Procedure: SENTINEL NODE BIOPSY;  Surgeon: Eloy Herring, MD;  Location: [facility];  Service: Gynecology;  Laterality: N/A;   TRANSTHORACIC ECHOCARDIOGRAM  04/17/2010   ef 55%/  trivial AR and TR/ left LAE/     Social History   Socioeconomic History   Marital status: Divorced    Spouse name: Not on file   Number of children: Not on file   Years of education: Not on file   Highest education level: Bachelor's degree (e.g., BA, AB, BS)  Occupational History   Occupation: Sports coach    Employer: [employer]  Tobacco Use   Smoking status: Former    Types: Cigarettes   Smokeless tobacco: Never   Tobacco comments:    07-14-2017 per pt Smoked [age] ONLY as teen  Vaping Use   Vaping status: Never Used  Substance and  Sexual Activity   Alcohol use: Yes    Comment: rarely   Drug use: No   Sexual activity: Not on file  Other Topics Concern   Not on file  Social History Narrative   Not on file   Social Drivers of Health   Financial Resource Strain: Low Risk  (04/22/2023)   Overall Financial Resource Strain (CARDIA)    Difficulty of Paying Living Expenses: Not very hard  Food Insecurity: No Food Insecurity (04/22/2023)   Hunger Vital Sign    Worried About Running Out of Food in the Last Year: Never true    Ran Out of Food in the Last Year: Never true  Transportation Needs: No Transportation Needs (04/22/2023)   PRAPARE - Administrator, Civil Service (Medical): No    Lack of  Transportation (Non-Medical): No  Physical Activity: Sufficiently Active (04/22/2023)   Exercise Vital Sign    Days of Exercise per Week: 5 days    Minutes of Exercise per Session: 120 min  Stress: No Stress Concern Present (04/22/2023)   [employer] of Occupational Health - Occupational Stress Questionnaire    Feeling of Stress : Only a little  Social Connections: Moderately Isolated (04/22/2023)   Social Connection and Isolation Panel [NHANES]    Frequency of Communication with Friends and Family: More than three times a week    Frequency of Social Gatherings with Friends and Family: More than three times a week    Attends Religious Services: 1 to 4 times per year    Active Member of [employer] or Organizations: No    Attends Engineer, Structural: Not on file    Marital Status: Divorced   Allergies  Allergen Reactions   Amoxicillin Hives   Other Anaphylaxis    TREE NUTS   Penicillins Hives and Rash    Hives Has patient had a PCN reaction causing immediate rash, facial/tongue/throat swelling, SOB or lightheadedness with hypotension: No Has patient had a PCN reaction causing severe rash involving mucus membranes or skin necrosis: No Has patient had a PCN reaction that required hospitalization: No Has patient had a PCN reaction occurring within the last 10 years: No If all of the above answers are NO, then may proceed with Cephalosporin use.  Hives Has patient had a PCN reaction causing immediate rash, facial/tongue/throat swelling, SOB or lightheadedness with hypotension: No Has patient had a PCN reaction causing severe rash involving mucus membranes or skin necrosis: No Has patient had a PCN reaction that required hospitalization: No Has patient had a PCN reaction occurring within the last 10 years: No If all of the above answers are NO, then may proceed with Cephalosporin use. Hives Has patient had a PCN reaction causing immediate rash, facial/tongue/throat swelling,  SOB or lightheadedness with hypotension: No Has patient had a PCN reaction causing severe rash involving mucus membranes or skin necrosis: No Has patient had a PCN reaction that required hospitalization: No Has patient had a PCN reaction occurring within the last 10 years: No If all of the above answers are NO, then may proceed with Cephalosporin use.   Macrodantin  [Nitrofurantoin Macrocrystal] Rash   Paroxetine Other (See Comments)    Headache , malaise   Family History  Problem Relation Age of Onset   Heart attack Mother 50   COPD Mother    Lung cancer Mother    Peripheral vascular disease Mother    Colon polyps Father    COPD Father    Diabetes Father  diet controlled   Hypertension Brother        2 bro   Stroke Neg Hx     Current Outpatient Medications (Endocrine & Metabolic):    empagliflozin  (JARDIANCE ) 25 MG TABS tablet, Take 1 tablet (25 mg total) by mouth daily before breakfast.   JARDIANCE  10 MG TABS tablet, TAKE 1 TABLET BY MOUTH DAILY BEFORE BREAKFAST.   metFORMIN  (GLUCOPHAGE -XR) 500 MG 24 hr tablet, Take 2 tablets (1,000 mg total) by mouth 2 (two) times daily.  Current Outpatient Medications (Cardiovascular):    EPINEPHrine  0.3 mg/0.3 mL IJ SOAJ injection, Inject 0.3 mg into the muscle as needed for anaphylaxis.   lisinopril  (ZESTRIL ) 2.5 MG tablet, Take 1 tablet (2.5 mg total) by mouth daily.   pravastatin  (PRAVACHOL ) 40 MG tablet, Take 1 tablet (40 mg total) by mouth daily.     Current Outpatient Medications (Other):    Melatonin 5 MG CHEW, Chew 3 each by mouth as needed.   Omega-3 Fatty Acids (FISH OIL ADULT GUMMIES PO), Take by mouth daily.   Reviewed prior external information including notes and imaging from  primary care provider As well as notes that were available from care everywhere and other healthcare systems.  Past medical history, social, surgical and family history all reviewed in electronic medical record.  No pertanent information  unless stated regarding to the chief complaint.   Review of Systems:  No headache, visual changes, nausea, vomiting, diarrhea, constipation, dizziness, abdominal pain, skin rash, fevers, chills, night sweats, weight loss, swollen lymph nodes, body aches, joint swelling, chest pain, shortness of breath, mood changes. POSITIVE muscle aches  Objective  Blood pressure 102/64, pulse (!) 103, height 5' 4 (1.626 m), weight 206 lb (93.4 kg), SpO2 97%.   General: No apparent distress alert and oriented x3 mood and affect normal, dressed appropriately.  HEENT: Pupils equal, extraocular movements intact  Respiratory: Patient's speak in full sentences and does not appear short of breath  Cardiovascular: No lower extremity edema, non tender, no erythema  Shoulder: Right Inspection reveals no abnormalities, atrophy or asymmetry. Palpation is normal with no tenderness over AC joint or bicipital groove. ROM is full in all planes passively. Rotator cuff strength normal throughout. signs of impingement with positive Neer and Hawkin's tests, but negative empty can sign. Speeds and Yergason's tests normal. No labral pathology noted with negative Obrien's, negative clunk and good stability. Normal scapular function observed. No painful arc and no drop arm sign. No apprehension sign  MSK US  performed of: Right This study was ordered, performed, and interpreted by Darlyn Sharps D.O.  Shoulder:   Supraspinatus:  Appears normal on long and transverse views, Bursal bulge seen with shoulder abduction on impingement view. Infraspinatus:  Appears normal on long and transverse views. Significant increase in Doppler flow Subscapularis:  Appears normal on long and transverse views. Positive bursa Teres Minor:  Appears normal on long and transverse views. AC joint:  Capsule undistended, no geyser sign. Glenohumeral Joint:  Appears normal without effusion. Glenoid Labrum:  Intact without visualized tears. Biceps  Tendon:  Appears normal on long and transverse views, no fraying of tendon, tendon located in intertubercular groove, no subluxation with shoulder internal or external rotation. Images were stored in patient's chart Impression: Subacromial bursitis  Procedure: Real-time Ultrasound Guided Injection of right glenohumeral joint Device: GE Logiq E  Ultrasound guided injection is preferred based studies that show increased duration, increased effect, greater accuracy, decreased procedural pain, increased response rate with ultrasound guided versus blind injection.  Verbal informed consent obtained.  Time-out conducted.  Noted no overlying erythema, induration, or other signs of local infection.  Skin prepped in a sterile fashion.  Local anesthesia: Topical Ethyl chloride.  With sterile technique and under real time ultrasound guidance:  Joint visualized.  23g 1  inch needle inserted posterior approach. Pictures taken for needle placement. Patient did have injection of 0.5% Marcaine, and 1.0 cc of Kenalog  40 mg/dL. Completed without difficulty  Pain immediately resolved suggesting accurate placement of the medication.  Advised to call if fevers/chills, erythema, induration, drainage, or persistent bleeding.   Images stored in patient's EMR Impression: Technically successful ultrasound guided injection.   [id number]; 15 additional minutes spent for Therapeutic exercises as stated in above notes.  This included exercises focusing on stretching, strengthening, with significant focus on eccentric aspects.   Long term goals include an improvement in range of motion, strength, endurance as well as avoiding reinjury. Patient's frequency would include in 1-2 times a day, 3-5 times a week for a duration of 6-12 weeks. Shoulder Exercises that included:  Basic scapular stabilization to include adduction and depression of scapula Scaption, focusing on proper movement and good control Internal and External rotation  utilizing a theraband, with elbow tucked at side entire time Rows with theraband    Proper technique shown and discussed handout in great detail with ATC.  All questions were discussed and answered.     Impression and Recommendations:     The above documentation has been reviewed and is accurate and complete Adellyn Capek M Humna Moorehouse, DO

## 2023-05-03 ENCOUNTER — Other Ambulatory Visit: Payer: Self-pay

## 2023-05-03 ENCOUNTER — Ambulatory Visit: Payer: Medicare HMO | Admitting: Family Medicine

## 2023-05-03 ENCOUNTER — Ambulatory Visit (INDEPENDENT_AMBULATORY_CARE_PROVIDER_SITE_OTHER): Payer: Medicare HMO

## 2023-05-03 ENCOUNTER — Encounter: Payer: Self-pay | Admitting: Family Medicine

## 2023-05-03 VITALS — BP 102/64 | HR 103 | Ht 64.0 in | Wt 206.0 lb

## 2023-05-03 DIAGNOSIS — M542 Cervicalgia: Secondary | ICD-10-CM | POA: Diagnosis not present

## 2023-05-03 DIAGNOSIS — M47812 Spondylosis without myelopathy or radiculopathy, cervical region: Secondary | ICD-10-CM | POA: Diagnosis not present

## 2023-05-03 DIAGNOSIS — M7551 Bursitis of right shoulder: Secondary | ICD-10-CM | POA: Diagnosis not present

## 2023-05-03 DIAGNOSIS — M25551 Pain in right hip: Secondary | ICD-10-CM | POA: Diagnosis not present

## 2023-05-03 DIAGNOSIS — M778 Other enthesopathies, not elsewhere classified: Secondary | ICD-10-CM | POA: Diagnosis not present

## 2023-05-03 DIAGNOSIS — M25511 Pain in right shoulder: Secondary | ICD-10-CM

## 2023-05-03 DIAGNOSIS — M19011 Primary osteoarthritis, right shoulder: Secondary | ICD-10-CM | POA: Diagnosis not present

## 2023-05-03 NOTE — Patient Instructions (Addendum)
 Xrays today Do prescribed exercises at least 3x a week Ice when needed See you again in 2-3 months

## 2023-05-03 NOTE — Assessment & Plan Note (Signed)
 Acute onset, seems to be secondary to repetitive motions, discussed with patient that icing regimen and home exercises, which activities to do and which ones to avoid.  Increase activity slowly.  Increase activity as tolerated.  Follow-up with me again in 6 to 8 weeks

## 2023-05-04 ENCOUNTER — Ambulatory Visit: Payer: Medicare HMO | Admitting: Internal Medicine

## 2023-05-06 NOTE — Progress Notes (Signed)
ERror

## 2023-05-11 ENCOUNTER — Other Ambulatory Visit: Payer: Self-pay | Admitting: Internal Medicine

## 2023-05-16 ENCOUNTER — Encounter: Payer: Self-pay | Admitting: Internal Medicine

## 2023-05-16 ENCOUNTER — Ambulatory Visit (INDEPENDENT_AMBULATORY_CARE_PROVIDER_SITE_OTHER): Payer: Medicare HMO | Admitting: Internal Medicine

## 2023-05-16 VITALS — BP 120/84 | HR 86 | Temp 98.4°F | Ht 64.0 in | Wt 205.0 lb

## 2023-05-16 DIAGNOSIS — Z7984 Long term (current) use of oral hypoglycemic drugs: Secondary | ICD-10-CM

## 2023-05-16 DIAGNOSIS — E785 Hyperlipidemia, unspecified: Secondary | ICD-10-CM | POA: Diagnosis not present

## 2023-05-16 DIAGNOSIS — Z0000 Encounter for general adult medical examination without abnormal findings: Secondary | ICD-10-CM | POA: Diagnosis not present

## 2023-05-16 DIAGNOSIS — E118 Type 2 diabetes mellitus with unspecified complications: Secondary | ICD-10-CM

## 2023-05-16 DIAGNOSIS — I10 Essential (primary) hypertension: Secondary | ICD-10-CM | POA: Diagnosis not present

## 2023-05-16 DIAGNOSIS — E1169 Type 2 diabetes mellitus with other specified complication: Secondary | ICD-10-CM | POA: Diagnosis not present

## 2023-05-16 LAB — LIPID PANEL
Cholesterol: 176 mg/dL (ref 0–200)
HDL: 76.1 mg/dL (ref >39.00)
LDL Cholesterol: 61 mg/dL (ref 0–99)
NonHDL: 100.13
Total CHOL/HDL Ratio: 2
Triglycerides: 194 mg/dL — ABNORMAL HIGH (ref 0.0–149.0)
VLDL: 38.8 mg/dL (ref 0.0–40.0)

## 2023-05-16 LAB — COMPREHENSIVE METABOLIC PANEL
ALT: 18 U/L (ref 0–35)
AST: 18 U/L (ref 0–37)
Albumin: 4.1 g/dL (ref 3.5–5.2)
Alkaline Phosphatase: 75 U/L (ref 39–117)
BUN: 21 mg/dL (ref 6–23)
CO2: 28 meq/L (ref 19–32)
Calcium: 9 mg/dL (ref 8.4–10.5)
Chloride: 102 meq/L (ref 96–112)
Creatinine, Ser: 0.76 mg/dL (ref 0.40–1.20)
GFR: 82.21 mL/min (ref >60.00)
Glucose, Bld: 147 mg/dL — ABNORMAL HIGH (ref 70–99)
Potassium: 4.3 meq/L (ref 3.5–5.1)
Sodium: 138 meq/L (ref 135–145)
Total Bilirubin: 0.3 mg/dL (ref 0.2–1.2)
Total Protein: 6.5 g/dL (ref 6.0–8.3)

## 2023-05-16 LAB — POCT GLYCOSYLATED HEMOGLOBIN (HGB A1C): HbA1c POC (<> result, manual entry): 8.8 % (ref 4.0–5.6)

## 2023-05-16 LAB — CBC
HCT: 47 % — ABNORMAL HIGH (ref 36.0–46.0)
Hemoglobin: 15.2 g/dL — ABNORMAL HIGH (ref 12.0–15.0)
MCHC: 32.2 g/dL (ref 30.0–36.0)
MCV: 83 fL (ref 78.0–100.0)
Platelets: 286 10*3/uL (ref 150.0–400.0)
RBC: 5.67 Mil/uL — ABNORMAL HIGH (ref 3.87–5.11)
RDW: 13.9 % (ref 11.5–15.5)
WBC: 8.2 10*3/uL (ref 4.0–10.5)

## 2023-05-16 LAB — MICROALBUMIN / CREATININE URINE RATIO
Creatinine,U: 60.9 mg/dL
Microalb Creat Ratio: 1.2 mg/g (ref 0.0–30.0)
Microalb, Ur: 0.7 mg/dL (ref 0.0–1.9)

## 2023-05-16 NOTE — Assessment & Plan Note (Signed)
BP at goal on lisinopril 2.5 mg daily and checking CMP and adjust as needed.

## 2023-05-16 NOTE — Progress Notes (Signed)
n

## 2023-05-16 NOTE — Assessment & Plan Note (Signed)
BMI 35.19 and complicated by diabetes and hypertension and hyperlipidemia. Working on weight loss with diet and exercise and on metformin.

## 2023-05-16 NOTE — Patient Instructions (Addendum)
We will have you talk to the pharmacist about the options for diabetes we can do long term.  Margaret [name] , Thank you for taking time to come for your Medicare Wellness Visit. I appreciate your ongoing commitment to your health goals. Please review the following plan we discussed and let me know if I can assist you in the future.   These are the goals we discussed:  Goals   None     This is a list of the screening recommended for you and due dates:  Health Maintenance  Topic Date Due   Pap with HPV screening  01/05/2019   COVID-19 Vaccine (7 - 2024-25 season) 04/11/2023   Yearly kidney function blood test for diabetes  05/06/2023   Yearly kidney health urinalysis for diabetes  05/06/2023   Eye exam for diabetics  05/06/2023   Hemoglobin A1C  11/13/2023   Complete foot exam   05/15/2024   Medicare Annual Wellness Visit  05/15/2024   Mammogram  03/30/2025   DTaP/Tdap/Td vaccine (3 - Td or Tdap) 04/29/2029   Colon Cancer Screening  04/22/2031   Pneumonia Vaccine  Completed   Flu Shot  Completed   DEXA scan (bone density measurement)  Completed   Hepatitis C Screening  Completed   HIV Screening  Completed   Zoster (Shingles) Vaccine  Completed   HPV Vaccine  Aged Out

## 2023-05-16 NOTE — Assessment & Plan Note (Addendum)
Since changes made to medicare starting Jan 2025 she is unable to afford jardiance. We did advise her to start jardiance 10 mg daily and then increase to 25 mg daily at month 2 however she did not understand this and is still taking 10 mg daily. Her HgA1c POC was done today and has improved about 1 point. She needs to meet with a pharmacist about cost of medications. She does not believe she will qualify for patient assistance. We discussed how there are generic medications for diabetes however these do not help with cardiovascular risk they just lower the sugars. Referral to pharmacy to help [place] assess if cost would be best with amaryl (glimepiride) or if any sglt-2 would be more cost effective for her (we discussed there are some cheaper ones which may be more like 100-200 per month and she is unsure if this is affordable). Continue metformin 1000 mg BID. She is on ACE-I and statin. Checking lipid panel and microalbumin and BMP and adjust as needed. She is still uncontrolled and above goal.

## 2023-05-16 NOTE — Assessment & Plan Note (Signed)
Flu shot yearly. Pneumonia complete. Shingrix complete. Tetanus up to date. Colonoscopy up to date. Mammogram up to date, pap smear up to date and dexa up to date. Counseled about sun safety and mole surveillance. Counseled about the dangers of distracted driving. Given 10 year screening recommendations.

## 2023-05-16 NOTE — Assessment & Plan Note (Signed)
Checking lipid panel and adjust as needed pravastatin 40 mg daily. Goal <70 LDL.

## 2023-05-16 NOTE — Progress Notes (Signed)
   Subjective:   Patient ID: Margaret [name], female    DOB: 12-05-1955, 68 y.o.   MRN: [id number]  HPI The patient is here for physical.  PMH, [facility], social history reviewed and updated  Review of Systems  Constitutional: Negative.   HENT: Negative.    Eyes: Negative.   Respiratory:  Negative for cough, chest tightness and shortness of breath.   Cardiovascular:  Negative for chest pain, palpitations and leg swelling.  Gastrointestinal:  Negative for abdominal distention, abdominal pain, constipation, diarrhea, nausea and vomiting.  Musculoskeletal: Negative.   Skin: Negative.   Neurological: Negative.   Psychiatric/Behavioral: Negative.      Objective:  Physical Exam Constitutional:      Appearance: She is well-developed.  HENT:     Head: Normocephalic and atraumatic.  Cardiovascular:     Rate and Rhythm: Normal rate and regular rhythm.  Pulmonary:     Effort: Pulmonary effort is normal. No respiratory distress.     Breath sounds: Normal breath sounds. No wheezing or rales.  Abdominal:     General: Bowel sounds are normal. There is no distension.     Palpations: Abdomen is soft.     Tenderness: There is no abdominal tenderness. There is no rebound.  Musculoskeletal:     Cervical back: Normal range of motion.  Skin:    General: Skin is warm and dry.  Neurological:     Mental Status: She is alert and oriented to person, place, and time.     Coordination: Coordination normal.     Vitals:   05/16/23 0823  BP: 120/84  Pulse: 86  Temp: 98.4 F (36.9 C)  TempSrc: Oral  SpO2: 98%  Weight: 205 lb (93 kg)  Height: 5\' 4"  (1.626 m)    Assessment & Plan:

## 2023-05-16 NOTE — Progress Notes (Signed)
Subjective:    Margaret [name] is a 68 y.o. female who presents for a Welcome to Medicare exam.          Objective:    Today's Vitals   05/16/23 0823  BP: 120/84  Pulse: 86  Temp: 98.4 F (36.9 C)  TempSrc: Oral  SpO2: 98%  Weight: 205 lb (93 kg)  Height: 5\' 4"  (1.626 m)  Body mass index is 35.19 kg/m.  Medications Outpatient Encounter Medications as of 05/16/2023  Medication Sig   empagliflozin (JARDIANCE) 10 MG TABS tablet TAKE 1 TABLET BY MOUTH DAILY BEFORE BREAKFAST.   EPINEPHrine 0.3 mg/0.3 mL IJ SOAJ injection Inject 0.3 mg into the muscle as needed for anaphylaxis.   lisinopril (ZESTRIL) 2.5 MG tablet Take 1 tablet (2.5 mg total) by mouth daily.   Melatonin 5 MG CHEW Chew 3 each by mouth as needed.   metFORMIN (GLUCOPHAGE-XR) 500 MG 24 hr tablet Take 2 tablets (1,000 mg total) by mouth 2 (two) times daily.   Omega-3 Fatty Acids (FISH OIL ADULT GUMMIES PO) Take by mouth daily.   pravastatin (PRAVACHOL) 40 MG tablet Take 1 tablet (40 mg total) by mouth daily.   empagliflozin (JARDIANCE) 25 MG TABS tablet Take 1 tablet (25 mg total) by mouth daily before breakfast. (Patient not taking: Reported on 05/16/2023)   No facility-administered encounter medications on file as of 05/16/2023.     History: Past Medical History:  Diagnosis Date   Arthritis    Endometrial cancer (HCC)    Exercise-induced asthma    Heart murmur    History of bradycardia 2011   had an episode of HR 38 at time of blood work w/ palpitations and SOB,  work-up done by cardiology (dr dalton Shirlee Latch) stress echo normal on 01/ 2012 and (12/ 2011) holter monitor with PACs with average HR 68/  07-14-2017 per pt no issues since then   Hx of colonic polyps    x3   IBS (irritable bowel syndrome)    OSA (obstructive sleep apnea) 07-14-2017 per had a new study on 07-12-2017 have not heard about results yet   per study 01-06-2005 moderate OSA (AHI 23.3/hr) used cpap, per pt lost weight withthe resolution of  symptoms but has gain weight back   Type 2 diabetes mellitus (HCC)    Wears glasses    Past Surgical History:  Procedure Laterality Date   colonoscopy with polypectomy  2012 approx.   DOBUTAMINE STRESS ECHO  04/2010   normal with no evidence of ischemia or infarction   LAPAROSCOPIC CHOLECYSTECTOMY  1996   PLANTAR FASCIA RELEASE     ROBOTIC ASSISTED SUPRACERVICAL HYSTERECTOMY WITH BILATERAL SALPINGO OOPHERECTOMY N/A 07/19/2017   Procedure: XI ROBOTIC ASSISTED  HYSTERECTOMY WITH BILATERAL SALPINGO OOPHORECTOMY; SENTINEL LYMPH NODE BIOPSY;  Surgeon: Adolphus Birchwood, MD;  Location: [facility];  Service: Gynecology;  Laterality: N/A;   SENTINEL NODE BIOPSY N/A 07/19/2017   Procedure: SENTINEL NODE BIOPSY;  Surgeon: Adolphus Birchwood, MD;  Location: [facility];  Service: Gynecology;  Laterality: N/A;   TRANSTHORACIC ECHOCARDIOGRAM  04/17/2010   ef 55%/  trivial AR and TR/ left LAE/      Family History  Problem Relation Age of Onset   Heart attack Mother 37   COPD Mother    Lung cancer Mother    Peripheral vascular disease Mother    Colon polyps Father    COPD Father    Diabetes Father        diet controlled   Hypertension  Brother        2 bro   Stroke Neg Hx    Social History   Occupational History   Occupation: Environmental health practitioner: [employer]  Tobacco Use   Smoking status: Former    Types: Cigarettes   Smokeless tobacco: Never   Tobacco comments:    07-14-2017 per pt Smoked 18-19 ONLY as teen  Vaping Use   Vaping status: Never Used  Substance and Sexual Activity   Alcohol use: Yes    Comment: rarely   Drug use: No   Sexual activity: Not on file    Tobacco Counseling Counseling given: Not Answered Tobacco comments: 07-14-2017 per pt Smoked 16-19 ONLY as teen   Immunizations and Health Maintenance Immunization History  Administered Date(s) Administered   Influenza Inj Mdck Quad Pf 02/25/2014, 01/06/2019, 01/20/2021   Influenza, Mdck, Trivalent,PF 6+ MOS(egg  free) 02/25/2014   Influenza, Seasonal, Injecte, Preservative Fre 05/08/2012, 01/12/2023   Influenza,inj,Quad PF,6+ Mos 01/18/2016, 01/12/2022   Influenza-Unspecified 02/01/2015, 01/26/2018, 12/24/2019   PFIZER Comirnaty(Gray Top)Covid-19 Tri-Sucrose Vaccine 01/21/2022   PFIZER(Purple Top)SARS-COV-2 Vaccination 07/02/2019, 07/24/2019, 01/30/2020   PNEUMOCOCCAL CONJUGATE-20 01/12/2023   Pfizer Covid-19 Vaccine Bivalent Booster 65yrs & up 03/02/2021   Pfizer(Comirnaty)Fall Seasonal Vaccine 12 years and older 02/14/2023   Pneumococcal Polysaccharide-23 01/06/2016   Respiratory Syncytial Virus Vaccine,Recomb Aduvanted(Arexvy) 03/29/2022   Tdap 04/19/2009, 04/30/2019   Zoster Recombinant(Shingrix) 03/31/2018, 07/05/2018   Health Maintenance Due  Topic Date Due   Cervical Cancer Screening (HPV/Pap Cotest)  01/05/2019   COVID-19 Vaccine (7 - 2024-25 season) 04/11/2023   Diabetic kidney evaluation - eGFR measurement  05/06/2023   Diabetic kidney evaluation - Urine ACR  05/06/2023   OPHTHALMOLOGY EXAM  05/06/2023    Activities of Daily Living    05/16/2023    8:59 AM  In your present state of health, do you have any difficulty performing the following activities:  Hearing? 0  Vision? 0  Difficulty concentrating or making decisions? 0  Walking or climbing stairs? 0  Dressing or bathing? 0  Doing errands, shopping? 0  Preparing Food and eating ? N  Using the Toilet? N  In the past six months, have you accidently leaked urine? Y  Do you have problems with loss of bowel control? N  Managing your Medications? N  Managing your Finances? N  Housekeeping or managing your Housekeeping? N    Physical Exam   Physical Exam in other note  Advanced Directives: Does Patient Have a Medical Advance Directive?: Yes Type of Advance Directive: Out of facility DNR (pink MOST or yellow form), Living will, Healthcare Power of Attorney Copy of Healthcare Power of Attorney in Chart?: Yes - validated  most recent copy scanned in chart (See row information)  EKG:  normal EKG, normal sinus rhythm, unchanged from previous tracings, PAC's noted      Assessment:    This is a routine wellness examination for this patient .   Vision/Hearing screen Vision Screening   Right eye Left eye Both eyes  Without correction     With correction 20/15 20/20 20/20   Hearing Screening - Comments:: Normal whisper test to both ears with bilateral hearing aids   Goals   None    Depression Screen    05/16/2023    8:31 AM 05/05/2022    8:39 AM 05/05/2022    8:19 AM 05/04/2021    8:55 AM  PHQ 2/9 Scores  PHQ - 2 Score 0 0 0  0  PHQ- 9 Score 0 3 0      Fall Risk    05/16/2023    8:31 AM  Fall Risk   Falls in the past year? 0  Number falls in past yr: 0  Injury with Fall? 0  Follow up Falls evaluation completed    Cognitive Function:        05/16/2023    8:49 AM  6CIT Screen  What Year? 0 points  What month? 0 points  What time? 0 points  Count back from 20 0 points  Months in reverse 0 points  Repeat phrase 0 points  Total Score 0 points    Patient Care Team: Myrlene Broker, MD as PCP - General (Internal Medicine)     Plan:     I have personally reviewed and noted the following in the patient's chart:   Medical and social history Use of alcohol, tobacco or illicit drugs  Current medications and supplements Functional ability and status Nutritional status Physical activity Advanced directives List of other physicians Hospitalizations, surgeries, and ER visits in previous 12 months Vitals Screenings to include cognitive, depression, and falls Referrals and appointments  In addition, I have reviewed and discussed with patient certain preventive protocols, quality metrics, and best practice recommendations. A written personalized care plan for preventive services as well as general preventive health recommendations were provided to patient.     Myrlene Broker, MD 05/16/2023

## 2023-05-17 ENCOUNTER — Other Ambulatory Visit (HOSPITAL_COMMUNITY): Payer: Self-pay | Admitting: Gastroenterology

## 2023-05-17 DIAGNOSIS — R1906 Epigastric swelling, mass or lump: Secondary | ICD-10-CM | POA: Diagnosis not present

## 2023-05-17 DIAGNOSIS — R142 Eructation: Secondary | ICD-10-CM | POA: Diagnosis not present

## 2023-05-17 DIAGNOSIS — R110 Nausea: Secondary | ICD-10-CM | POA: Diagnosis not present

## 2023-05-17 DIAGNOSIS — E663 Overweight: Secondary | ICD-10-CM | POA: Diagnosis not present

## 2023-05-23 ENCOUNTER — Telehealth: Payer: Self-pay

## 2023-05-23 NOTE — Progress Notes (Signed)
Care Guide Pharmacy Note  05/23/2023 Name: Margaret [name] MRN: [id number] DOB: 02/07/1956  Referred By: Myrlene Broker, MD Reason for referral: Care Coordination (Outreach to schedule with pharm d )   Margaret [name] is a 68 y.o. year old female who is a primary care patient of Myrlene Broker, MD.  [name] was referred to the pharmacist for assistance related to: DMII  Successful contact was made with the patient to discuss pharmacy services including being ready for the pharmacist to call at least 5 minutes before the scheduled appointment time and to have medication bottles and any blood pressure readings ready for review. The patient agreed to meet with the pharmacist via telephone visit on (date/time).06/10/2023  Penne Lash , RMA     [place]  [facility] Guide  Direct Dial: [phone]  Website: [email or web address]

## 2023-05-31 ENCOUNTER — Encounter (HOSPITAL_COMMUNITY)
Admission: RE | Admit: 2023-05-31 | Discharge: 2023-05-31 | Disposition: A | Discharge status: Home / Self Care | Payer: Medicare HMO | Source: Ambulatory Visit | Attending: Gastroenterology | Admitting: Gastroenterology

## 2023-05-31 DIAGNOSIS — R142 Eructation: Secondary | ICD-10-CM | POA: Insufficient documentation

## 2023-05-31 DIAGNOSIS — R1906 Epigastric swelling, mass or lump: Secondary | ICD-10-CM | POA: Diagnosis not present

## 2023-05-31 DIAGNOSIS — R110 Nausea: Secondary | ICD-10-CM | POA: Diagnosis not present

## 2023-05-31 MED ORDER — TECHNETIUM TC 99M SULFUR COLLOID
2.0500 | Freq: Once | INTRAVENOUS | Status: AC | PRN
  Administered 2023-05-31: 2.05 via ORAL

## 2023-06-10 ENCOUNTER — Other Ambulatory Visit: Payer: Medicare HMO | Admitting: Pharmacist

## 2023-06-10 DIAGNOSIS — E118 Type 2 diabetes mellitus with unspecified complications: Secondary | ICD-10-CM

## 2023-06-10 NOTE — Progress Notes (Signed)
 06/10/2023 Name: Margaret [name] MRN: [id number] DOB: Aug 24, 1955  Chief Complaint  Patient presents with   Medication Access    Margaret [name] is a 68 y.o. year old female who presented for a telephone visit.   They were referred to the pharmacist by their PCP for assistance in managing medication access.    Subjective:  Care Team: Primary Care Provider: Myrlene Broker, MD ; Next Scheduled Visit: 08/14/24  Medication Access/Adherence  Current Pharmacy:  [id number] - [place], [place] - [address] AT [facility] [address] [place] [address] Phone: [phone] Fax: [phone]  [facility] [place] - [address] [place] [address] Phone: [phone] Fax: [phone]  [id number] - [place], [place] - [address] AT [facility] [address] [place] [address] Phone: [phone] Fax: [phone]   Patient reports affordability concerns with their medications: Yes  Patient reports access/transportation concerns to their pharmacy: No  Patient reports adherence concerns with their medications:  No     Diabetes:  Current medications: metformin XR 500 mg 2 tab BID, Jardiance 10 mg daily *Pt has been unable to afford Jardiance - cost went up from $47 per month to $400 + when she got it filled this year  Current physical activity: walks 9 miles a few times per week   Objective:  Lab Results  Component Value Date   HGBA1C 8.8 05/16/2023    Lab Results  Component Value Date   CREATININE 0.76 05/16/2023   BUN 21 05/16/2023   NA 138 05/16/2023   K 4.3 05/16/2023   CL 102 05/16/2023   CO2 28 05/16/2023    Lab Results  Component Value Date   CHOL 176 05/16/2023   HDL 76.10 05/16/2023   LDLCALC 61 05/16/2023   LDLDIRECT 105.0 05/05/2022   TRIG 194.0 (H) 05/16/2023   CHOLHDL 2  05/16/2023    Medications Reviewed Today     Reviewed by Bonita Quin, RPH (Pharmacist) on 06/10/23 at 1011  Med List Status: <None>   Medication Order Taking? Sig Documenting Provider Last Dose Status Informant  empagliflozin (JARDIANCE) 10 MG TABS tablet [id number] No TAKE 1 TABLET BY MOUTH DAILY BEFORE BREAKFAST.  Patient not taking: Reported on 06/10/2023   Myrlene Broker, MD Not Taking Active   empagliflozin (JARDIANCE) 25 MG TABS tablet [id number] No Take 1 tablet (25 mg total) by mouth daily before breakfast.  Patient not taking: Reported on 06/10/2023   Myrlene Broker, MD Not Taking Active   EPINEPHrine 0.3 mg/0.3 mL IJ SOAJ injection [id number]  Inject 0.3 mg into the muscle as needed for anaphylaxis. Myrlene Broker, MD  Active   lisinopril (ZESTRIL) 2.5 MG tablet [id number]  Take 1 tablet (2.5 mg total) by mouth daily. Myrlene Broker, MD  Active   Melatonin 5 MG CHEW [id number]  Chew 3 each by mouth as needed. [provider]  Active   metFORMIN (GLUCOPHAGE-XR) 500 MG 24 hr tablet [id number] Yes Take 2 tablets (1,000 mg total) by mouth 2 (two) times daily. Myrlene Broker, MD Taking Active   Omega-3 Fatty Acids (FISH OIL ADULT GUMMIES PO) [id number]  Take by mouth daily. [provider]  Active   pravastatin (PRAVACHOL) 40 MG tablet [id number]  Take 1 tablet (40 mg total) by mouth daily. Myrlene Broker, MD  Active  Assessment/Plan:   Diabetes: - Currently uncontrolled, A1c goal <7% - Reviewed dietary modifications including increasing protein, balanced meals/snack, avoiding regular juice/soda/tea - Pt's insurance changed Jardiance coverage to 25% coinsurance. Cost went up to $158 per 30 DS and will not decrease until $2k max is met. Other SGLT2 inhibitors are not covered and therefore not cheaper. [facility] does have $60 per month program for [employer]. - Pt does not qualify for PAP for any diabetes  medications due to income - Since all brand name/preferred options will be unaffordable - recommend adding glipizide 5 mg BID with food. Pt would like to think about it and let PCP know on what she decides.    Follow Up Plan: PRN  Arbutus Leas, PharmD, BCPS, CPP Clinical Pharmacist Practitioner  Primary Care at [facility] [phone]

## 2023-06-10 NOTE — Patient Instructions (Signed)
 It was a pleasure speaking with you today!  I recommend adding glipizide 5 mg twice daily with meals. Let us know what you decide.  Feel free to call with any questions or concerns!  Arbutus Leas, PharmD, BCPS, CPP Clinical Pharmacist Practitioner [place] [facility] [phone]

## 2023-06-11 ENCOUNTER — Encounter: Payer: Self-pay | Admitting: Internal Medicine

## 2023-06-13 ENCOUNTER — Other Ambulatory Visit: Payer: Self-pay

## 2023-06-13 MED ORDER — EPINEPHRINE 0.3 MG/0.3ML IJ SOAJ: 0.3000 mg | INTRAMUSCULAR | 0 refills | Status: DC | PRN

## 2023-06-13 NOTE — Telephone Encounter (Signed)
 I have sent in epi pen for patient

## 2023-06-14 ENCOUNTER — Encounter: Payer: Self-pay | Admitting: Nurse Practitioner

## 2023-06-14 ENCOUNTER — Telehealth: Payer: Medicare HMO

## 2023-06-14 ENCOUNTER — Telehealth (INDEPENDENT_AMBULATORY_CARE_PROVIDER_SITE_OTHER): Payer: Medicare HMO | Admitting: Nurse Practitioner

## 2023-06-14 VITALS — Ht 64.0 in | Wt 198.0 lb

## 2023-06-14 DIAGNOSIS — J069 Acute upper respiratory infection, unspecified: Secondary | ICD-10-CM | POA: Diagnosis not present

## 2023-06-14 NOTE — Progress Notes (Signed)
 [facility] [address] [place] [address] Dept: [phone] Dept Fax: [phone]  Virtual Video Visit  I connected with Margaret [name] Hence on 06/14/23 at 11:00 AM EST by a video enabled telemedicine application and verified that I am speaking with the correct person using two identifiers.  Location patient: Home Location provider: Clinic Persons participating in the virtual visit: Patient; Margaret Pickle, NP; Malena Peer, CMA  I discussed the limitations of evaluation and management by telemedicine and the availability of in person appointments. The patient expressed understanding and agreed to proceed.  Chief Complaint  Patient presents with   Cough    With fever since Monday-taken Covid test yesterday and today that were negative    SUBJECTIVE:  HPI: Margaret [name] is a 68 y.o. female who presents with cough and fever for 3 days.  UPPER RESPIRATORY TRACT INFECTION  Fever: yes 100.7 Cough: yes - congested Shortness of breath: no Wheezing: no Chest pain: no Chest tightness: yes Chest congestion: no Nasal congestion: yes Runny nose: no Post nasal drip: yes Sneezing: no Sore throat: yes Swollen glands: no Sinus pressure: no Headache: yes Face pain: no Toothache: no Ear pain: no bilateral Ear pressure: yes bilateral Eyes red/itching:no Eye drainage/crusting: no  Vomiting: no Rash: no Fatigue: yes Sick contacts: yes - son Context: stable Recurrent sinusitis: no Relief with OTC cold/cough medications: yes  Treatments attempted: aleve, decongestant   Patient Active Problem List   Diagnosis Date Noted   Acute bursitis of right shoulder 05/03/2023   Greater trochanteric bursitis of left hip 11/29/2022   Left wrist pain 11/29/2022   Hyperlipidemia associated with type 2 diabetes mellitus (HCC) 04/30/2019   Greater trochanteric bursitis of right hip 04/24/2018   Malignant endometrial  neoplasm with intraepithelial neoplasia (HCC) 07/19/2017   Tear of gluteus minimus tendon, right, initial encounter 02/22/2017   Routine general medical examination at a health care facility 10/11/2014   Sleep apnea 05/08/2012   Morbid obesity (HCC) 04/27/2011   CARDIAC MURMUR 03/30/2010   PAC (premature atrial contraction) 03/30/2010   Diabetes mellitus type 2 with complications (HCC) 02/01/2008   Essential hypertension 02/01/2008    Past Surgical History:  Procedure Laterality Date   colonoscopy with polypectomy  2012 approx.   DOBUTAMINE STRESS ECHO  04/2010   normal with no evidence of ischemia or infarction   LAPAROSCOPIC CHOLECYSTECTOMY  1996   PLANTAR FASCIA RELEASE     ROBOTIC ASSISTED SUPRACERVICAL HYSTERECTOMY WITH BILATERAL SALPINGO OOPHERECTOMY N/A 07/19/2017   Procedure: XI ROBOTIC ASSISTED  HYSTERECTOMY WITH BILATERAL SALPINGO OOPHORECTOMY; SENTINEL LYMPH NODE BIOPSY;  Surgeon: Adolphus Birchwood, MD;  Location: [facility];  Service: Gynecology;  Laterality: N/A;   SENTINEL NODE BIOPSY N/A 07/19/2017   Procedure: SENTINEL NODE BIOPSY;  Surgeon: Adolphus Birchwood, MD;  Location: [facility];  Service: Gynecology;  Laterality: N/A;   TRANSTHORACIC ECHOCARDIOGRAM  04/17/2010   ef 55%/  trivial AR and TR/ left LAE/      Family History  Problem Relation Age of Onset   Heart attack Mother 96   COPD Mother    Lung cancer Mother    Peripheral vascular disease Mother    Colon polyps Father    COPD Father    Diabetes Father        diet controlled   Hypertension Brother        2 bro   Stroke Neg Hx     Social History   Tobacco Use  Smoking status: Former    Types: Cigarettes   Smokeless tobacco: Never   Tobacco comments:    07-14-2017 per pt Smoked 18-19 ONLY as teen  Vaping Use   Vaping status: Never Used  Substance Use Topics   Alcohol use: Yes    Comment: rarely   Drug use: No     Current Outpatient Medications:    lisinopril (ZESTRIL) 2.5 MG tablet, Take 1 tablet (2.5 mg  total) by mouth daily., Disp: 90 tablet, Rfl: 3   Melatonin 5 MG CHEW, Chew 3 each by mouth as needed., Disp: , Rfl:    metFORMIN (GLUCOPHAGE-XR) 500 MG 24 hr tablet, Take 2 tablets (1,000 mg total) by mouth 2 (two) times daily., Disp: 360 tablet, Rfl: 3   Omega-3 Fatty Acids (FISH OIL ADULT GUMMIES PO), Take by mouth daily., Disp: , Rfl:    pravastatin (PRAVACHOL) 40 MG tablet, Take 1 tablet (40 mg total) by mouth daily., Disp: 90 tablet, Rfl: 3   EPINEPHrine 0.3 mg/0.3 mL IJ SOAJ injection, Inject 0.3 mg into the muscle as needed for anaphylaxis. (Patient not taking: Reported on 06/14/2023), Disp: 1 each, Rfl: 0  Allergies  Allergen Reactions   Amoxicillin Hives   Other Anaphylaxis    TREE NUTS   Penicillins Hives and Rash    Hives Has patient had a PCN reaction causing immediate rash, facial/tongue/throat swelling, SOB or lightheadedness with hypotension: No Has patient had a PCN reaction causing severe rash involving mucus membranes or skin necrosis: No Has patient had a PCN reaction that required hospitalization: No Has patient had a PCN reaction occurring within the last 10 years: No If all of the above answers are "NO", then may proceed with Cephalosporin use.  Hives Has patient had a PCN reaction causing immediate rash, facial/tongue/throat swelling, SOB or lightheadedness with hypotension: No Has patient had a PCN reaction causing severe rash involving mucus membranes or skin necrosis: No Has patient had a PCN reaction that required hospitalization: No Has patient had a PCN reaction occurring within the last 10 years: No If all of the above answers are "NO", then may proceed with Cephalosporin use. Hives Has patient had a PCN reaction causing immediate rash, facial/tongue/throat swelling, SOB or lightheadedness with hypotension: No Has patient had a PCN reaction causing severe rash involving mucus membranes or skin necrosis: No Has patient had a PCN reaction that required  hospitalization: No Has patient had a PCN reaction occurring within the last 10 years: No If all of the above answers are "NO", then may proceed with Cephalosporin use.   Macrodantin  [Nitrofurantoin Macrocrystal] Rash   Paroxetine Other (See Comments)    Headache , malaise    ROS: See pertinent positives and negatives per HPI.  OBSERVATIONS/OBJECTIVE:  VITALS per patient if applicable: Today's Vitals   06/14/23 1052  Weight: 198 lb (89.8 kg)  Height: 5\' 4"  (1.626 m)   Body mass index is 33.99 kg/m.    GENERAL: Alert and oriented. Appears well and in no acute distress.  HEENT: Atraumatic. Conjunctiva clear. No obvious abnormalities on inspection of external nose and ears.  NECK: Normal movements of the head and neck.  LUNGS: On inspection, no signs of respiratory distress. Breathing rate appears normal. No obvious gross SOB, gasping or wheezing, and no conversational dyspnea.  CV: No obvious cyanosis.  MS: Moves all visible extremities without noticeable abnormality.  PSYCH/NEURO: Pleasant and cooperative. No obvious depression or anxiety. Speech and thought processing grossly intact.  ASSESSMENT AND PLAN:  Problem List Items Addressed This Visit   None Visit Diagnoses       Upper respiratory tract infection, unspecified type    -  Primary   Covid-19 negative, outside window for flu treatment. Encourage fluids, rest. Continue alternating tylenol/ibuprofen prn fever and decongestant. Work note given        I discussed the assessment and treatment plan with the patient. The patient was provided an opportunity to ask questions and all were answered. The patient agreed with the plan and demonstrated an understanding of the instructions.   The patient was advised to call back or seek an in-person evaluation if the symptoms worsen or if the condition fails to improve as anticipated.   Gerre Scull, NP

## 2023-06-14 NOTE — Patient Instructions (Signed)
 It was great to see you!  Drink plenty of fluids and get rest  Continue taking tylenol and ibuprofen as needed for fever  You can start nyquil as needed/continue decongestant  I have sent a work note to Pharmacologist.   Let's follow-up if symptoms worsen or don't improve.   Take care,  [name], NP

## 2023-06-22 ENCOUNTER — Encounter: Payer: Self-pay | Admitting: Internal Medicine

## 2023-06-22 ENCOUNTER — Other Ambulatory Visit: Payer: Self-pay | Admitting: Internal Medicine

## 2023-06-22 MED ORDER — EPINEPHRINE 0.3 MG/0.3ML IJ SOAJ: 0.3000 mg | INTRAMUSCULAR | 0 refills | Status: AC | PRN

## 2023-06-24 MED ORDER — GLIMEPIRIDE 2 MG PO TABS: 2.0000 mg | ORAL_TABLET | Freq: Every day | ORAL | 3 refills | Status: DC

## 2023-07-28 NOTE — Progress Notes (Signed)
 [facility] [address] [place] [address] Phone: [phone] Subjective:   IBryan Caprio, am serving as a scribe for Dr. Ronnell Coins.  I'm seeing this patient by the request  of:  Adelia Homestead, MD  CC: Right shoulder pain follow-up  ONG:[id number]  05/03/2023 Acute onset, seems to be secondary to repetitive motions, discussed with patient that icing regimen and home exercises, which activities to do and which ones to avoid. Increase activity slowly. Increase activity as tolerated. Follow-up with me again in 6 to 8 weeks    Update 08/08/2023 DESHAWNDA ACREY is a 68 y.o. female coming in with complaint of R shoulder pain. Patient states injections last visit didn't help much. Pain is worse and radiating down your arm into fingers. L knee started hurting about 10 days ago. No MOI.   Patient had last visit we did review patient's imaging and x-rays of the shoulder showed mild arthritic changes of the glenohumeral joint and the acromioclavicular joint. Was given injection greater than 3 months ago into the right shoulder.    Past Medical History:  Diagnosis Date   Arthritis    Endometrial cancer (HCC)    Exercise-induced asthma    Heart murmur    History of bradycardia 2011   had an episode of HR 38 at time of blood work w/ palpitations and SOB,  work-up done by cardiology (dr dalton Mitzie Anda) stress echo normal on 01/ 2012 and (12/ 2011) holter monitor with PACs with average HR 68/  07-14-2017 per pt no issues since then   Hx of colonic polyps    x3   IBS (irritable bowel syndrome)    OSA (obstructive sleep apnea) 07-14-2017 per had a new study on 07-12-2017 have not heard about results yet   per study 01-06-2005 moderate OSA (AHI 23.3/hr) used cpap, per pt lost weight withthe resolution of symptoms but has gain weight back   Type 2 diabetes mellitus (HCC)    Wears glasses    Past Surgical History:  Procedure Laterality Date    colonoscopy with polypectomy  2012 approx.   DOBUTAMINE  STRESS ECHO  04/2010   normal with no evidence of ischemia or infarction   LAPAROSCOPIC CHOLECYSTECTOMY  1996   PLANTAR FASCIA RELEASE     ROBOTIC ASSISTED SUPRACERVICAL HYSTERECTOMY WITH BILATERAL SALPINGO OOPHERECTOMY N/A 07/19/2017   Procedure: XI ROBOTIC ASSISTED  HYSTERECTOMY WITH BILATERAL SALPINGO OOPHORECTOMY; SENTINEL LYMPH NODE BIOPSY;  Surgeon: Alphonso Aschoff, MD;  Location: [facility];  Service: Gynecology;  Laterality: N/A;   SENTINEL NODE BIOPSY N/A 07/19/2017   Procedure: SENTINEL NODE BIOPSY;  Surgeon: Alphonso Aschoff, MD;  Location: [facility];  Service: Gynecology;  Laterality: N/A;   TRANSTHORACIC ECHOCARDIOGRAM  04/17/2010   ef 55%/  trivial AR and TR/ left LAE/     Social History   Socioeconomic History   Marital status: Divorced    Spouse name: Not on file   Number of children: Not on file   Years of education: Not on file   Highest education level: Bachelor's degree (e.g., BA, AB, BS)  Occupational History   Occupation: Sports coach    Employer: [employer]  Tobacco Use   Smoking status: Former    Types: Cigarettes   Smokeless tobacco: Never   Tobacco comments:    07-14-2017 per pt Smoked [age] ONLY as teen  Vaping Use   Vaping status: Never Used  Substance and Sexual Activity   Alcohol  use: Yes    Comment: rarely   Drug use: No   Sexual activity: Not on file  Other Topics Concern   Not on file  Social History Narrative   Not on file   Social Drivers of Health   Financial Resource Strain: Low Risk  (04/22/2023)   Overall Financial Resource Strain (CARDIA)    Difficulty of Paying Living Expenses: Not very hard  Food Insecurity: No Food Insecurity (04/22/2023)   Hunger Vital Sign    Worried About Running Out of Food in the Last Year: Never true    Ran Out of Food in the Last Year: Never true  Transportation Needs: No Transportation Needs (04/22/2023)   PRAPARE - Scientist, research (physical sciences) (Medical): No    Lack of Transportation (Non-Medical): No  Physical Activity: Sufficiently Active (04/22/2023)   Exercise Vital Sign    Days of Exercise per Week: 5 days    Minutes of Exercise per Session: 120 min  Stress: No Stress Concern Present (04/22/2023)   [employer] of Occupational Health - Occupational Stress Questionnaire    Feeling of Stress : Only a little  Social Connections: Moderately Isolated (04/22/2023)   Social Connection and Isolation Panel [NHANES]    Frequency of Communication with Friends and Family: More than three times a week    Frequency of Social Gatherings with Friends and Family: More than three times a week    Attends Religious Services: 1 to 4 times per year    Active Member of [employer] or Organizations: No    Attends Engineer, structural: Not on file    Marital Status: Divorced   Allergies  Allergen Reactions   Amoxicillin Hives   Other Anaphylaxis    TREE NUTS   Penicillins Hives and Rash    Hives Has patient had a PCN reaction causing immediate rash, facial/tongue/throat swelling, SOB or lightheadedness with hypotension: No Has patient had a PCN reaction causing severe rash involving mucus membranes or skin necrosis: No Has patient had a PCN reaction that required hospitalization: No Has patient had a PCN reaction occurring within the last 10 years: No If all of the above answers are "NO", then may proceed with Cephalosporin use.  Hives Has patient had a PCN reaction causing immediate rash, facial/tongue/throat swelling, SOB or lightheadedness with hypotension: No Has patient had a PCN reaction causing severe rash involving mucus membranes or skin necrosis: No Has patient had a PCN reaction that required hospitalization: No Has patient had a PCN reaction occurring within the last 10 years: No If all of the above answers are "NO", then may proceed with Cephalosporin use. Hives Has patient had a PCN reaction causing  immediate rash, facial/tongue/throat swelling, SOB or lightheadedness with hypotension: No Has patient had a PCN reaction causing severe rash involving mucus membranes or skin necrosis: No Has patient had a PCN reaction that required hospitalization: No Has patient had a PCN reaction occurring within the last 10 years: No If all of the above answers are "NO", then may proceed with Cephalosporin use.   Macrodantin  [Nitrofurantoin Macrocrystal] Rash   Paroxetine Other (See Comments)    Headache , malaise   Family History  Problem Relation Age of Onset   Heart attack Mother 59   COPD Mother    Lung cancer Mother    Peripheral vascular disease Mother    Colon polyps Father    COPD Father    Diabetes Father  diet controlled   Hypertension Brother        2 bro   Stroke Neg Hx     Current Outpatient Medications (Endocrine & Metabolic):    glimepiride  (AMARYL ) 2 MG tablet, Take 1 tablet (2 mg total) by mouth daily before breakfast.   metFORMIN  (GLUCOPHAGE -XR) 500 MG 24 hr tablet, Take 2 tablets (1,000 mg total) by mouth 2 (two) times daily.  Current Outpatient Medications (Cardiovascular):    EPINEPHrine  0.3 mg/0.3 mL IJ SOAJ injection, Inject 0.3 mg into the muscle as needed for anaphylaxis.   lisinopril  (ZESTRIL ) 2.5 MG tablet, Take 1 tablet (2.5 mg total) by mouth daily.   pravastatin  (PRAVACHOL ) 40 MG tablet, Take 1 tablet (40 mg total) by mouth daily.   Current Outpatient Medications (Analgesics):    celecoxib  (CELEBREX ) 100 MG capsule, Take 1 capsule (100 mg total) by mouth 2 (two) times daily.   Current Outpatient Medications (Other):    Melatonin 5 MG CHEW, Chew 3 each by mouth as needed.   Omega-3 Fatty Acids (FISH OIL ADULT GUMMIES PO), Take by mouth daily.   Reviewed prior external information including notes and imaging from  primary care provider As well as notes that were available from care everywhere and other healthcare systems.  Past medical history,  social, surgical and family history all reviewed in electronic medical record.  No pertanent information unless stated regarding to the chief complaint.   Review of Systems:  No headache, visual changes, nausea, vomiting, diarrhea, constipation, dizziness, abdominal pain, skin rash, fevers, chills, night sweats, weight loss, swollen lymph nodes, body aches, joint swelling, chest pain, shortness of breath, mood changes. POSITIVE muscle aches  Objective  Blood pressure 110/70, pulse 99, height 5\' 4"  (1.626 m), weight 210 lb (95.3 kg), SpO2 98%.   General: No apparent distress alert and oriented x3 mood and affect normal, dressed appropriately.  HEENT: Pupils equal, extraocular movements intact  Respiratory: Patient's speak in full sentences and does not appear short of breath  Cardiovascular: No lower extremity edema, non tender, no erythema  Shoulder exam shows positive impingement noted.  Patient does have pain with crossover test as well as tenderness to palpation over the right acromioclavicular joint Left knee does have some lateral tracking of the patella noted.  Positive patellar grind test noted.  ACL appears to be intact  Procedure: Real-time Ultrasound Guided Injection of right glenohumeral joint Device: GE Logiq Q7  Ultrasound guided injection is preferred based studies that show increased duration, increased effect, greater accuracy, decreased procedural pain, increased response rate with ultrasound guided versus blind injection.  Verbal informed consent obtained.  Time-out conducted.  Noted no overlying erythema, induration, or other signs of local infection.  Skin prepped in a sterile fashion.  Local anesthesia: Topical Ethyl chloride.  With sterile technique and under real time ultrasound guidance:  Joint visualized.  23g 1  inch needle inserted posterior approach. Pictures taken for needle placement. Patient did have injection of 2 cc of 0.5% Marcaine, and 1.0 cc of Kenalog  40  mg/dL. Completed without difficulty  Pain immediately resolved suggesting accurate placement of the medication.  Advised to call if fevers/chills, erythema, induration, drainage, or persistent bleeding.  Images saved Impression: Technically successful ultrasound guided injection.  Procedure: Real-time Ultrasound Guided Injection of right acromioclavicular joint Device: GE Logiq Q7 Ultrasound guided injection is preferred based studies that show increased duration, increased effect, greater accuracy, decreased procedural pain, increased response rate, and decreased cost with ultrasound guided versus blind injection.  Verbal informed consent obtained.  Time-out conducted.  Noted no overlying erythema, induration, or other signs of local infection.  Skin prepped in a sterile fashion.  Local anesthesia: Topical Ethyl chloride.  With sterile technique and under real time ultrasound guidance: With a 25-gauge half inch needle injected with 0.5 cc of 0.5% Marcaine and 0.5 cc of Kenalog  40 mg/mL Completed without difficulty  Pain immediately resolved suggesting accurate placement of the medication.  Images saved Advised to call if fevers/chills, erythema, induration, drainage, or persistent bleeding.  Impression: Technically successful ultrasound guided injection.    Impression and Recommendations:     The above documentation has been reviewed and is accurate and complete Yamen Castrogiovanni M Laylee Schooley, DO

## 2023-08-07 IMAGING — DX DG LUMBAR SPINE 2-3V
3 series · 3 of 3 positions shown · non-contrast
Comparison: No prior lumbar imaging.

CLINICAL DATA: Greater trochanteric bursitis of right hip. Tear of
gluteus minimus tendon, right, initial encounter. Hip pain. Patient
reports lateral right hip pain. No known injury.

EXAM:
LUMBAR SPINE - 2-3 VIEW

[l-spine ap]
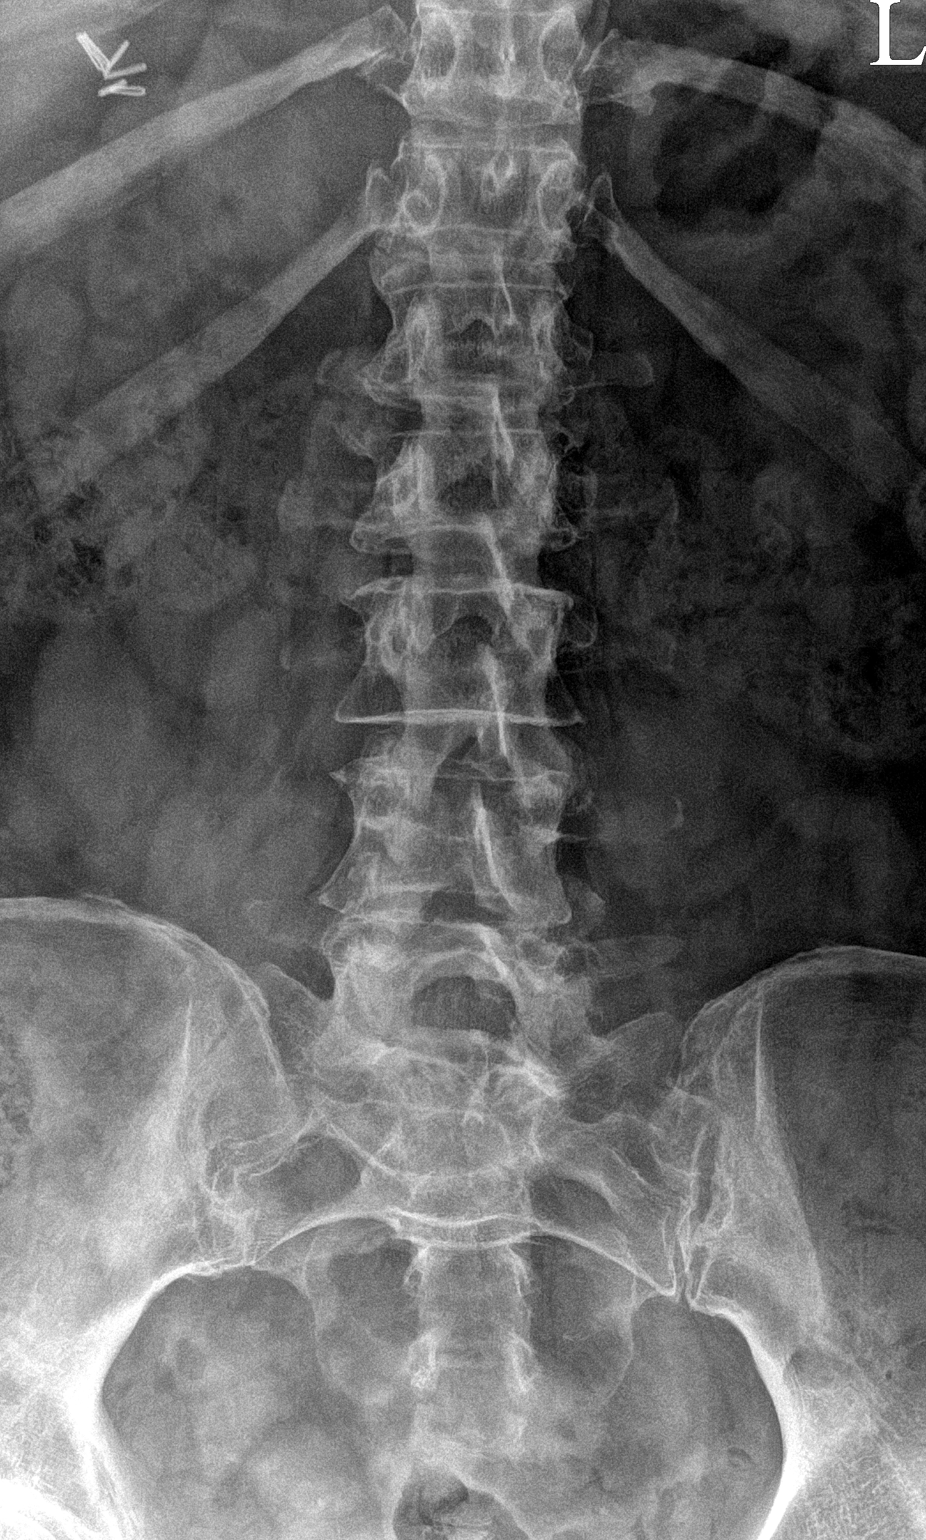

[l-spine lateral]
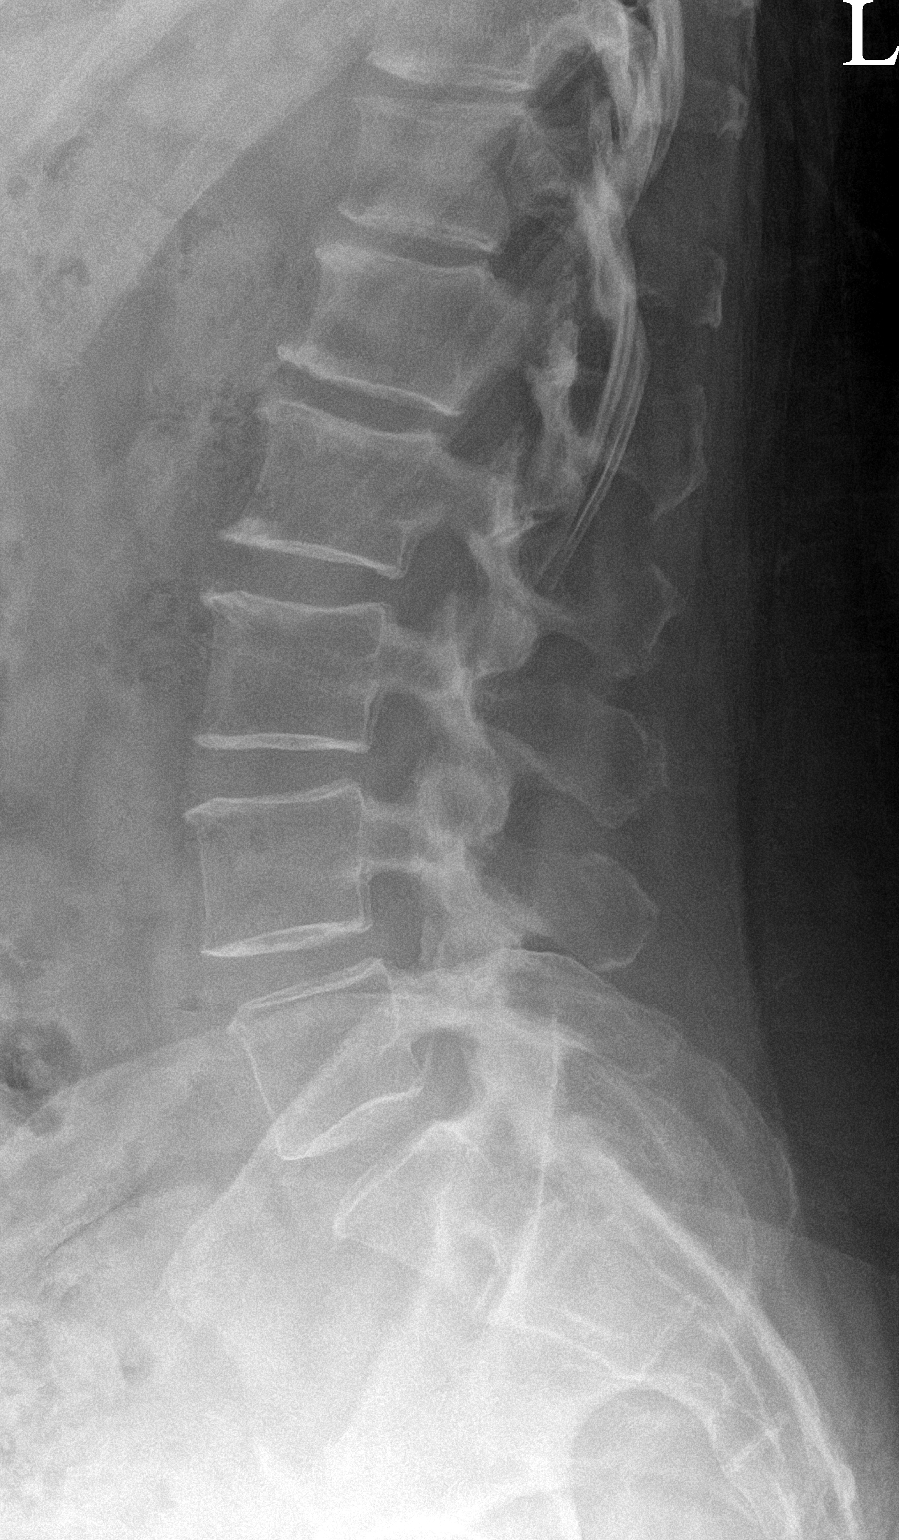

[l-spine spot]
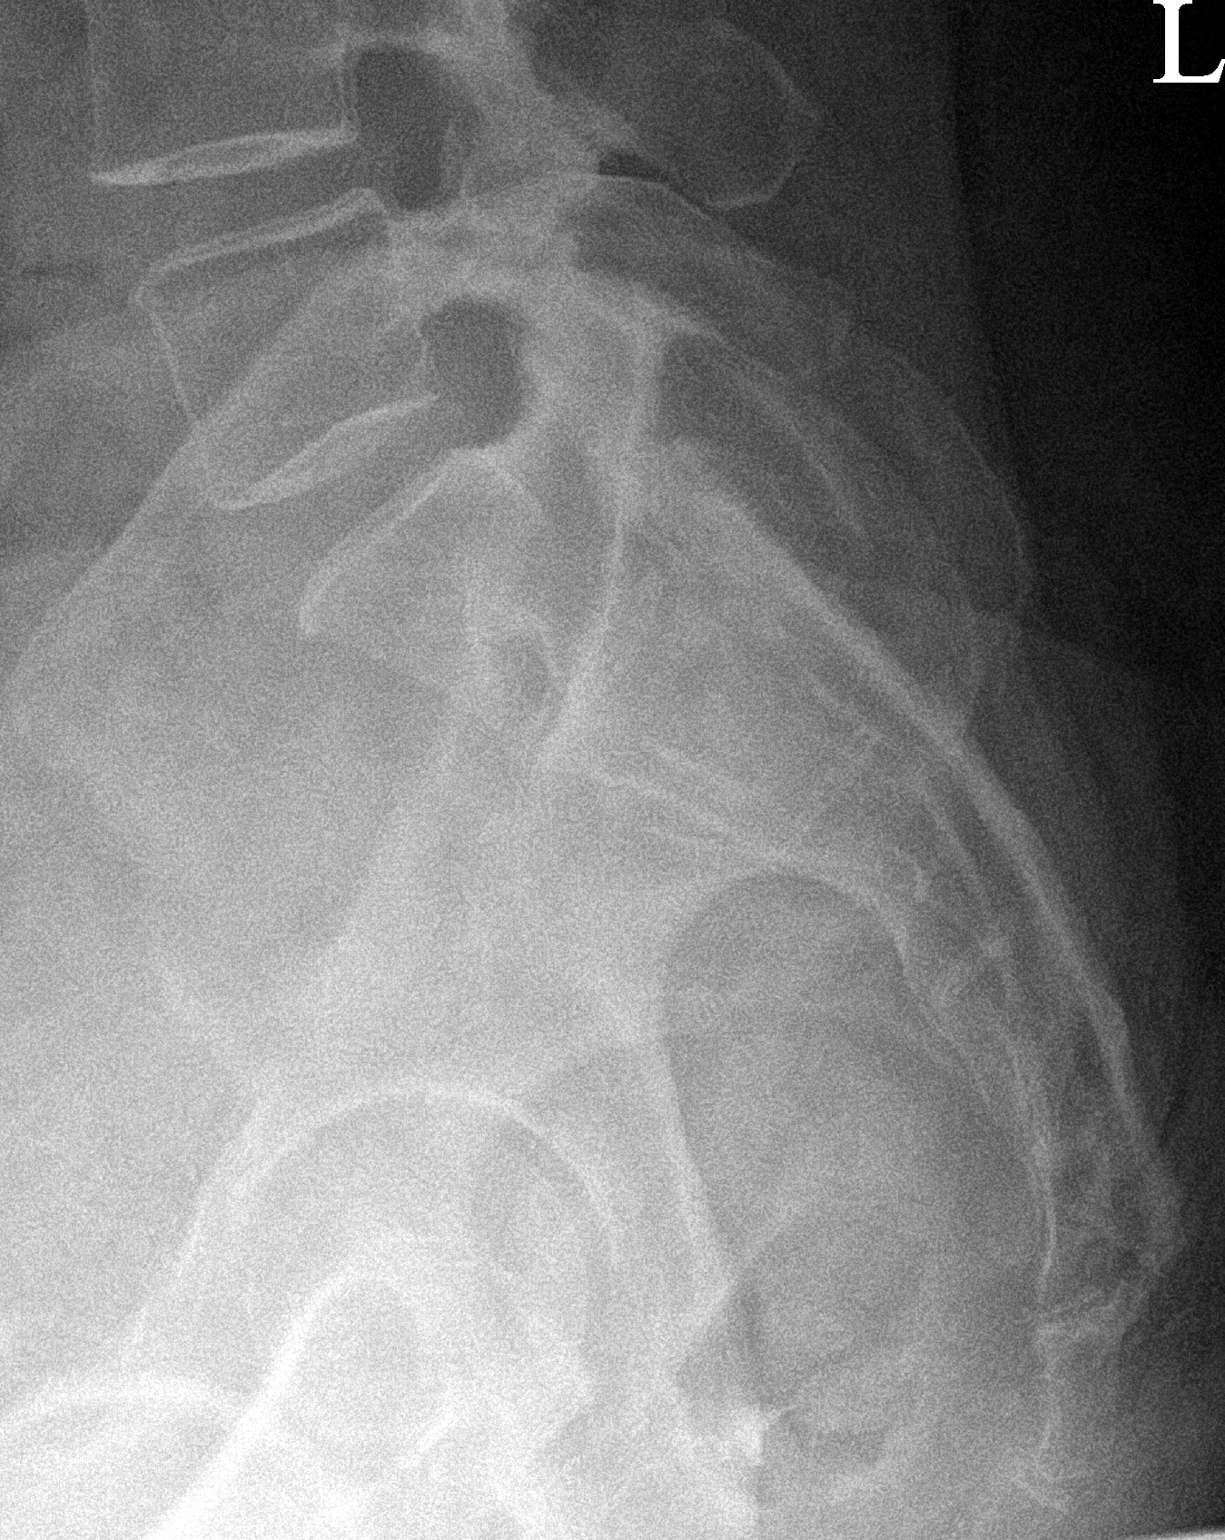

[3 of 3 positions shown; findings below may reference images not displayed]

FINDINGS: There are 5 non-rib-bearing lumbar vertebra the bones are
subjectively under mineralized. There is trace anterolisthesis of L4
on L5. Otherwise normal alignment. Normal vertebral body heights.
Mild L1-L2 disc space narrowing. There is mild endplate spurring at
multiple levels. Facet hypertrophy at L4-L5 and L5-S1. No evidence
of fracture, focal bone lesion, or bone destruction. Sacroiliac
joints are congruent.
IMPRESSION: Degenerative change in the lumbar spine with facet hypertrophy and
mild L1-L2 disc space narrowing.

## 2023-08-07 IMAGING — DX DG HIP (WITH OR WITHOUT PELVIS) 2-3V*R*
3 series · 3 of 3 positions shown · non-contrast
Comparison: Right hip radiograph 11/20/2020

CLINICAL DATA: Greater trochanteric bursitis of right hip. Tear of
gluteus minimus tendon, right, initial encounter. Hip pain. Patient
reports lateral right hip pain. No known injury.

EXAM:
DG HIP (WITH OR WITHOUT PELVIS) 2-3V RIGHT

[pelvis ap]
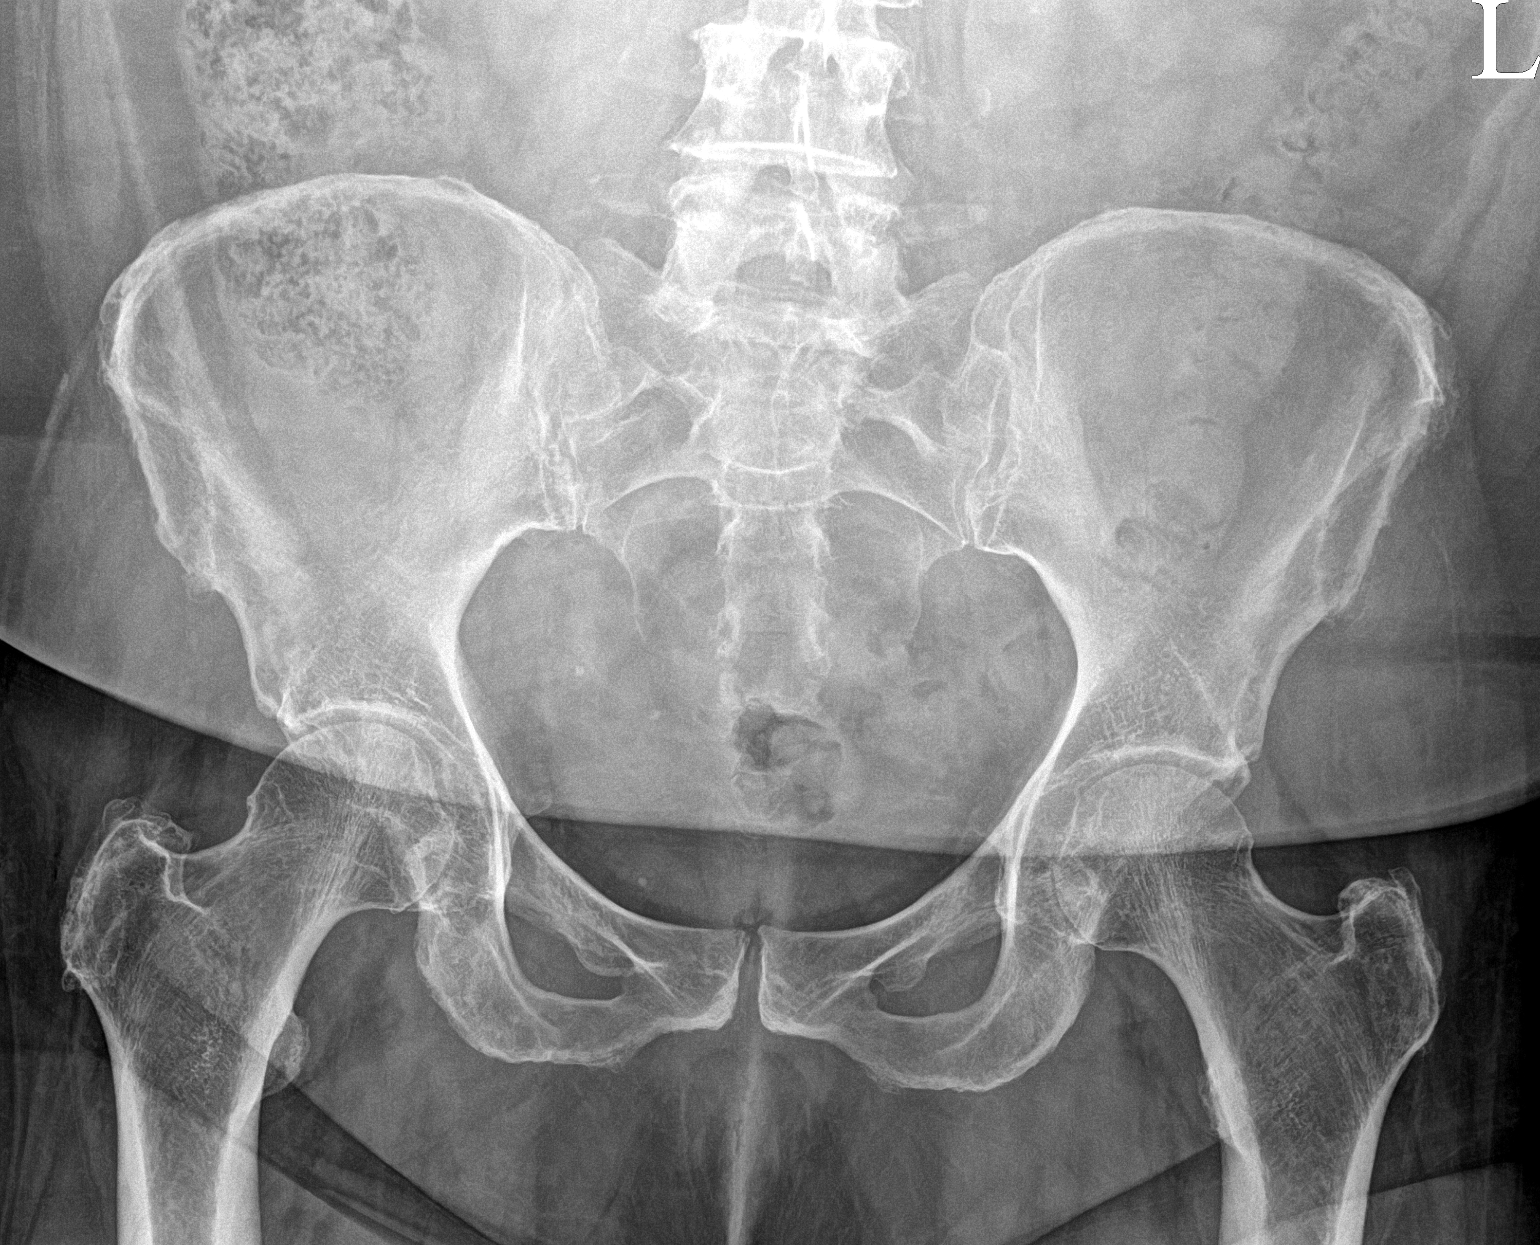

[hip ap]
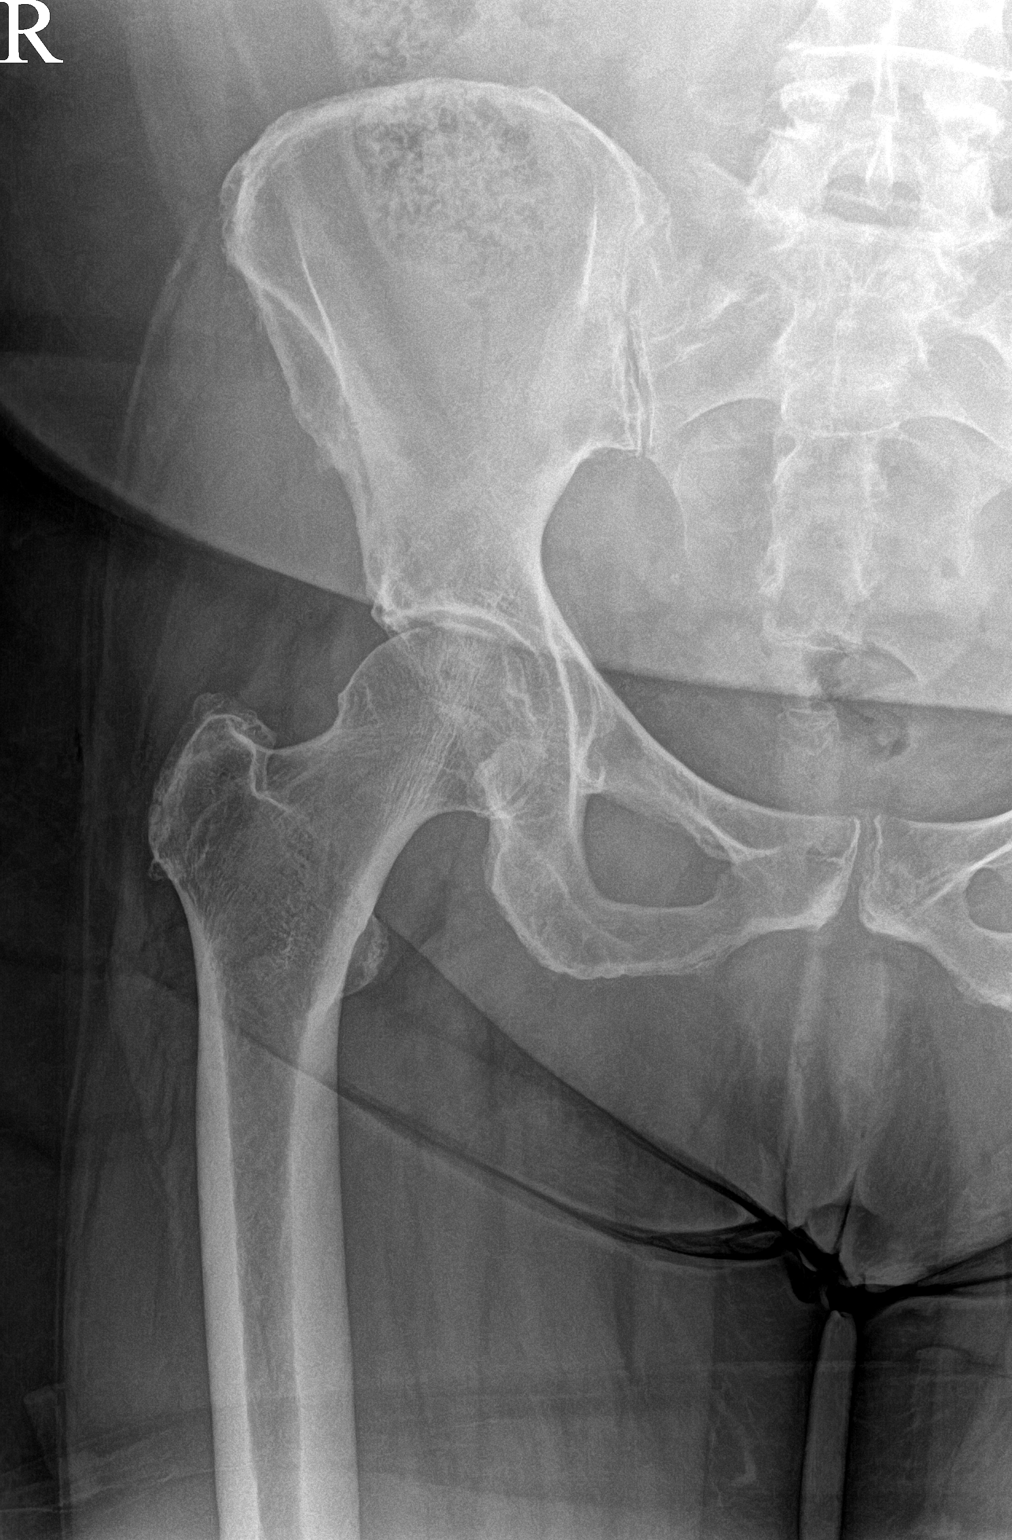

[hip frog leg]
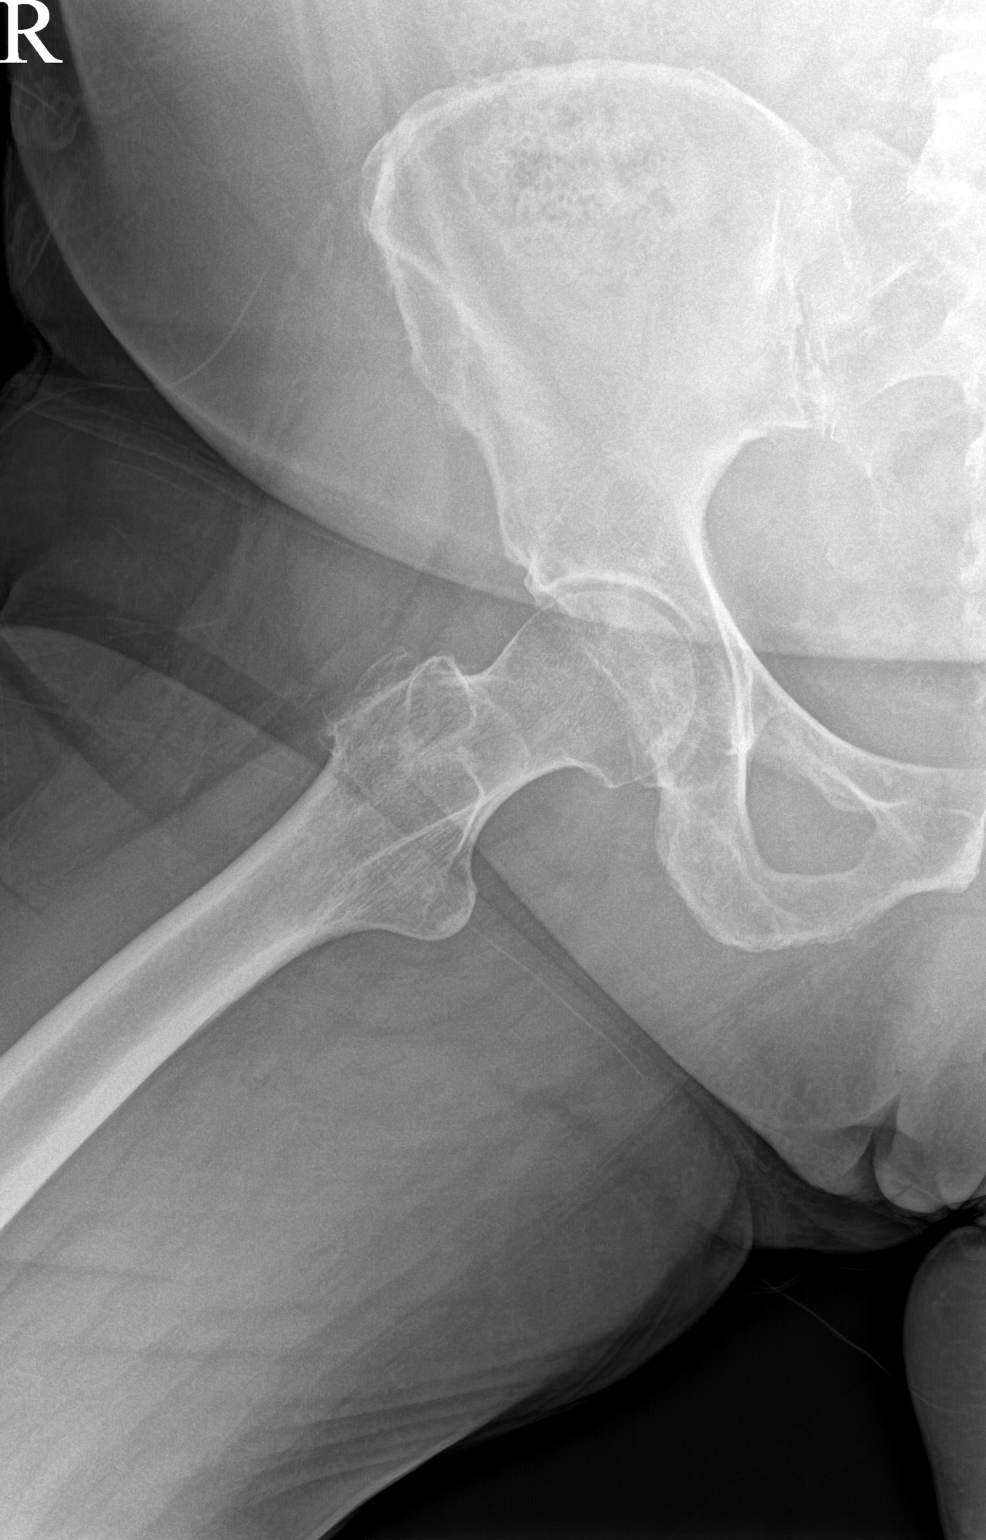

[3 of 3 positions shown; findings below may reference images not displayed]

FINDINGS: The bones are subjectively under mineralized. There is mild
bilateral hip joint space narrowing. Mild bilateral acetabular
spurring with subchondral cystic change. Femoral head is well
seated. Minimal enthesopathic change about the right greater
trochanter. No fracture, erosion, evidence of a vascular necrosis or
focal bone abnormality. No periosteal reaction. Intact pubic rami.
Pubic symphysis is congruent with mild chondrocalcinosis.
IMPRESSION: 1. Mild bilateral hip osteoarthritis.
2. Mild enthesopathic change at the right greater trochanter.

## 2023-08-08 ENCOUNTER — Encounter: Payer: Self-pay | Admitting: Family Medicine

## 2023-08-08 ENCOUNTER — Other Ambulatory Visit: Payer: Self-pay

## 2023-08-08 ENCOUNTER — Ambulatory Visit (INDEPENDENT_AMBULATORY_CARE_PROVIDER_SITE_OTHER): Payer: Medicare HMO | Admitting: Family Medicine

## 2023-08-08 VITALS — BP 110/70 | HR 99 | Ht 64.0 in | Wt 210.0 lb

## 2023-08-08 DIAGNOSIS — M222X2 Patellofemoral disorders, left knee: Secondary | ICD-10-CM | POA: Diagnosis not present

## 2023-08-08 DIAGNOSIS — M7551 Bursitis of right shoulder: Secondary | ICD-10-CM | POA: Diagnosis not present

## 2023-08-08 DIAGNOSIS — M25511 Pain in right shoulder: Secondary | ICD-10-CM | POA: Diagnosis not present

## 2023-08-08 DIAGNOSIS — M19011 Primary osteoarthritis, right shoulder: Secondary | ICD-10-CM

## 2023-08-08 MED ORDER — CELECOXIB 100 MG PO CAPS: 100.0000 mg | ORAL_CAPSULE | Freq: Two times a day (BID) | ORAL | 3 refills | Status: DC

## 2023-08-08 NOTE — Assessment & Plan Note (Signed)
 Reviewed anatomy using anatomical model and how PFS occurs.  Given rehab exercises handout for VMO, hip abductors, core, entire kinetic chain including proprioception exercises.  Could benefit from PT, regular exercise, upright biking, and a PFS knee brace to assist with tracking abnormalities. Worsening pain consider injection encourage weight loss.  Patient has gained 12 pounds in the last 2 months.  Likely secondary to the medication.  Discussed decreasing glipizide to half the dose and following up with primary care next week.

## 2023-08-08 NOTE — Assessment & Plan Note (Signed)
 Patient given injection and tolerated the procedure well.  Discussed icing regimen of home exercises, if no significant improvement with the injections differential includes cervical radiculopathy as well as the intra-articular pathology of the shoulder.  Patient will follow-up with me again in 2 to 3 months.  Worsening pain again would need to consider advanced imaging.

## 2023-08-08 NOTE — Assessment & Plan Note (Signed)
 Chronic problem with exacerbation again.  Does have some hypoechoic changes.  Patient is a type II diabetic and will check her blood sugars.  The moment is getting lower blood sugars with patient being on glipizide at this moment.  Discussed with patient about icing regimen of home exercises, which activities to do and which ones to avoid.  Increase activity slowly.Worsening pain pain after failed all other conservative therapy will need to consider advanced imaging.  Follow-up again in 3 months Celebrex  100 mg twice daily as needed also given

## 2023-08-08 NOTE — Patient Instructions (Addendum)
 You have 14 days to return or exchange your brace Call [phone], then return the brace to our office   Do prescribed exercises at least 3x a week  Celebrex  100mg  prescribed Injections in shoulder today See you again in 2 months

## 2023-08-15 ENCOUNTER — Encounter: Payer: Self-pay | Admitting: Internal Medicine

## 2023-08-15 ENCOUNTER — Ambulatory Visit (INDEPENDENT_AMBULATORY_CARE_PROVIDER_SITE_OTHER): Payer: Medicare HMO | Admitting: Internal Medicine

## 2023-08-15 VITALS — BP 118/84 | HR 100 | Temp 99.0°F | Ht 64.0 in | Wt 207.0 lb

## 2023-08-15 DIAGNOSIS — Z7984 Long term (current) use of oral hypoglycemic drugs: Secondary | ICD-10-CM | POA: Diagnosis not present

## 2023-08-15 DIAGNOSIS — E118 Type 2 diabetes mellitus with unspecified complications: Secondary | ICD-10-CM

## 2023-08-15 LAB — POCT GLYCOSYLATED HEMOGLOBIN (HGB A1C): Hemoglobin A1C: 8.4 % — AB (ref 4.0–5.6)

## 2023-08-15 NOTE — Progress Notes (Signed)
   Subjective:   Patient ID: Margaret [name], female    DOB: [birth year], 68 y.o.   MRN: [id number]  HPI The patient is a 68 YO female coming in for medical management (see A/P for details). Started amaryl  about 1 month ago due to cost had to stop jardiance .   Review of Systems  Constitutional: Negative.   HENT: Negative.    Eyes: Negative.   Respiratory:  Negative for cough, chest tightness and shortness of breath.   Cardiovascular:  Negative for chest pain, palpitations and leg swelling.  Gastrointestinal:  Negative for abdominal distention, abdominal pain, constipation, diarrhea, nausea and vomiting.  Musculoskeletal: Negative.   Skin: Negative.   Neurological: Negative.   Psychiatric/Behavioral: Negative.      Objective:  Physical Exam Constitutional:      Appearance: She is well-developed.  HENT:     Head: Normocephalic and atraumatic.  Cardiovascular:     Rate and Rhythm: Normal rate and regular rhythm.  Pulmonary:     Effort: Pulmonary effort is normal. No respiratory distress.     Breath sounds: Normal breath sounds. No wheezing or rales.  Abdominal:     General: Bowel sounds are normal. There is no distension.     Palpations: Abdomen is soft.     Tenderness: There is no abdominal tenderness. There is no rebound.  Musculoskeletal:     Cervical back: Normal range of motion.  Skin:    General: Skin is warm and dry.  Neurological:     Mental Status: She is alert and oriented to person, place, and time.     Coordination: Coordination normal.     Vitals:   08/15/23 0813  BP: 118/84  Pulse: 100  Temp: 99 F (37.2 C)  TempSrc: Oral  SpO2: 95%  Weight: 207 lb (93.9 kg)  Height: 5\' 4"  (1.626 m)    Assessment & Plan:

## 2023-08-15 NOTE — Patient Instructions (Signed)
 We will keep things the same for now.

## 2023-08-15 NOTE — Assessment & Plan Note (Signed)
 Started amaryl  2 mg daily about 1 month ago. Stopped jardiance  due to cost. Taking metformin  as well. HgA1c improving to 8.4 today. Recheck in 3 months as this likely does not reflect true impact of amaryl . Is on ACE-I and statin.

## 2023-08-25 DIAGNOSIS — R12 Heartburn: Secondary | ICD-10-CM | POA: Diagnosis not present

## 2023-08-25 DIAGNOSIS — R110 Nausea: Secondary | ICD-10-CM | POA: Diagnosis not present

## 2023-08-25 DIAGNOSIS — R140 Abdominal distension (gaseous): Secondary | ICD-10-CM | POA: Diagnosis not present

## 2023-08-25 DIAGNOSIS — R109 Unspecified abdominal pain: Secondary | ICD-10-CM | POA: Diagnosis not present

## 2023-08-25 DIAGNOSIS — R142 Eructation: Secondary | ICD-10-CM | POA: Diagnosis not present

## 2023-08-26 ENCOUNTER — Encounter: Payer: Self-pay | Admitting: Family Medicine

## 2023-08-30 ENCOUNTER — Other Ambulatory Visit: Payer: Self-pay

## 2023-08-30 ENCOUNTER — Ambulatory Visit: Admitting: Family Medicine

## 2023-08-30 ENCOUNTER — Encounter: Payer: Self-pay | Admitting: Family Medicine

## 2023-08-30 VITALS — BP 116/72 | HR 91 | Ht 64.0 in | Wt 206.0 lb

## 2023-08-30 DIAGNOSIS — M25362 Other instability, left knee: Secondary | ICD-10-CM | POA: Diagnosis not present

## 2023-08-30 DIAGNOSIS — M25562 Pain in left knee: Secondary | ICD-10-CM

## 2023-08-30 NOTE — Patient Instructions (Signed)
MRI L knee [phone] We will be in touch

## 2023-08-30 NOTE — Progress Notes (Signed)
 [facility] [address] [place] [address] Phone: [phone] Subjective:   Delwyn Filippo, am serving as a scribe for Dr. Ronnell Coins.  I'm seeing this patient by the request  of:  Adelia Homestead, MD  CC: Left knee pain  [id number]  Margaret [name] is a 68 y.o. female coming in with complaint of L knee pain. Patient states that last week her knee started aching and she walked a lot on Thursday and Friday but over weekend she was unable to walk on leg. States that pain is over medial aspect and over the back of knee and travels down into the calf. Cyst located in back of knee for quite a while.     Past Medical History:  Diagnosis Date   Arthritis    Endometrial cancer (HCC)    Exercise-induced asthma    Heart murmur    History of bradycardia 2011   had an episode of HR 38 at time of blood work w/ palpitations and SOB,  work-up done by cardiology (dr dalton Mitzie Anda) stress echo normal on 01/ 2012 and (12/ 2011) holter monitor with PACs with average HR 68/  07-14-2017 per pt no issues since then   Hx of colonic polyps    x3   IBS (irritable bowel syndrome)    OSA (obstructive sleep apnea) 07-14-2017 per had a new study on 07-12-2017 have not heard about results yet   per study 01-06-2005 moderate OSA (AHI 23.3/hr) used cpap, per pt lost weight withthe resolution of symptoms but has gain weight back   Type 2 diabetes mellitus (HCC)    Wears glasses    Past Surgical History:  Procedure Laterality Date   colonoscopy with polypectomy  2012 approx.   DOBUTAMINE  STRESS ECHO  04/2010   normal with no evidence of ischemia or infarction   LAPAROSCOPIC CHOLECYSTECTOMY  1996   PLANTAR FASCIA RELEASE     ROBOTIC ASSISTED SUPRACERVICAL HYSTERECTOMY WITH BILATERAL SALPINGO OOPHERECTOMY N/A 07/19/2017   Procedure: XI ROBOTIC ASSISTED  HYSTERECTOMY WITH BILATERAL SALPINGO OOPHORECTOMY; SENTINEL LYMPH NODE BIOPSY;  Surgeon: Alphonso Aschoff, MD;   Location: [facility];  Service: Gynecology;  Laterality: N/A;   SENTINEL NODE BIOPSY N/A 07/19/2017   Procedure: SENTINEL NODE BIOPSY;  Surgeon: Alphonso Aschoff, MD;  Location: [facility];  Service: Gynecology;  Laterality: N/A;   TRANSTHORACIC ECHOCARDIOGRAM  04/17/2010   ef 55%/  trivial AR and TR/ left LAE/     Social History   Socioeconomic History   Marital status: Divorced    Spouse name: Not on file   Number of children: Not on file   Years of education: Not on file   Highest education level: Bachelor's degree (e.g., BA, AB, BS)  Occupational History   Occupation: Environmental health practitioner: [employer]  Tobacco Use   Smoking status: Former    Types: Cigarettes   Smokeless tobacco: Never   Tobacco comments:    07-14-2017 per pt Smoked [age] ONLY as teen  Vaping Use   Vaping status: Never Used  Substance and Sexual Activity   Alcohol use: Yes    Comment: rarely   Drug use: No   Sexual activity: Not on file  Other Topics Concern   Not on file  Social History Narrative   Not on file   Social Drivers of Health   Financial Resource Strain: Low Risk  (04/22/2023)   Overall Financial Resource Strain (CARDIA)  Difficulty of Paying Living Expenses: Not very hard  Food Insecurity: No Food Insecurity (04/22/2023)   Hunger Vital Sign    Worried About Running Out of Food in the Last Year: Never true    Ran Out of Food in the Last Year: Never true  Transportation Needs: No Transportation Needs (04/22/2023)   PRAPARE - Administrator, Civil Service (Medical): No    Lack of Transportation (Non-Medical): No  Physical Activity: Sufficiently Active (04/22/2023)   Exercise Vital Sign    Days of Exercise per Week: 5 days    Minutes of Exercise per Session: 120 min  Stress: No Stress Concern Present (04/22/2023)   [employer] of Occupational Health - Occupational Stress Questionnaire    Feeling of Stress : Only a little  Social Connections: Moderately Isolated  (04/22/2023)   Social Connection and Isolation Panel [NHANES]    Frequency of Communication with Friends and Family: More than three times a week    Frequency of Social Gatherings with Friends and Family: More than three times a week    Attends Religious Services: 1 to 4 times per year    Active Member of [employer] or Organizations: No    Attends Engineer, structural: Not on file    Marital Status: Divorced   Allergies  Allergen Reactions   Amoxicillin Hives   Other Anaphylaxis    TREE NUTS   Penicillins Hives and Rash    Hives Has patient had a PCN reaction causing immediate rash, facial/tongue/throat swelling, SOB or lightheadedness with hypotension: No Has patient had a PCN reaction causing severe rash involving mucus membranes or skin necrosis: No Has patient had a PCN reaction that required hospitalization: No Has patient had a PCN reaction occurring within the last 10 years: No If all of the above answers are "NO", then may proceed with Cephalosporin use.  Hives Has patient had a PCN reaction causing immediate rash, facial/tongue/throat swelling, SOB or lightheadedness with hypotension: No Has patient had a PCN reaction causing severe rash involving mucus membranes or skin necrosis: No Has patient had a PCN reaction that required hospitalization: No Has patient had a PCN reaction occurring within the last 10 years: No If all of the above answers are "NO", then may proceed with Cephalosporin use. Hives Has patient had a PCN reaction causing immediate rash, facial/tongue/throat swelling, SOB or lightheadedness with hypotension: No Has patient had a PCN reaction causing severe rash involving mucus membranes or skin necrosis: No Has patient had a PCN reaction that required hospitalization: No Has patient had a PCN reaction occurring within the last 10 years: No If all of the above answers are "NO", then may proceed with Cephalosporin use.   Macrodantin  [Nitrofurantoin  Macrocrystal] Rash   Paroxetine Other (See Comments)    Headache , malaise   Family History  Problem Relation Age of Onset   Heart attack Mother 87   COPD Mother    Lung cancer Mother    Peripheral vascular disease Mother    Colon polyps Father    COPD Father    Diabetes Father        diet controlled   Hypertension Brother        2 bro   Stroke Neg Hx     Current Outpatient Medications (Endocrine & Metabolic):    glimepiride  (AMARYL ) 2 MG tablet, Take 1 tablet (2 mg total) by mouth daily before breakfast.   metFORMIN  (GLUCOPHAGE -XR) 500 MG 24 hr tablet, Take 2  tablets (1,000 mg total) by mouth 2 (two) times daily.  Current Outpatient Medications (Cardiovascular):    EPINEPHrine  0.3 mg/0.3 mL IJ SOAJ injection, Inject 0.3 mg into the muscle as needed for anaphylaxis.   lisinopril  (ZESTRIL ) 2.5 MG tablet, Take 1 tablet (2.5 mg total) by mouth daily.   pravastatin  (PRAVACHOL ) 40 MG tablet, Take 1 tablet (40 mg total) by mouth daily.   Current Outpatient Medications (Analgesics):    celecoxib  (CELEBREX ) 100 MG capsule, Take 1 capsule (100 mg total) by mouth 2 (two) times daily.   Current Outpatient Medications (Other):    Melatonin 5 MG CHEW, Chew 3 each by mouth as needed.   Omega-3 Fatty Acids (FISH OIL ADULT GUMMIES PO), Take by mouth daily.   Reviewed prior external information including notes and imaging from  primary care provider As well as notes that were available from care everywhere and other healthcare systems.  Past medical history, social, surgical and family history all reviewed in electronic medical record.  No pertanent information unless stated regarding to the chief complaint.   Review of Systems:  No headache, visual changes, nausea, vomiting, diarrhea, constipation, dizziness, abdominal pain, skin rash, fevers, chills, night sweats, weight loss, swollen lymph nodes, body aches, joint swelling, chest pain, shortness of breath, mood changes. POSITIVE muscle  aches  Objective  Blood pressure 116/72, pulse 91, height 5\' 4"  (1.626 m), weight 206 lb (93.4 kg), SpO2 97%.   General: No apparent distress alert and oriented x3 mood and affect normal, dressed appropriately.  HEENT: Pupils equal, extraocular movements intact  Respiratory: Patient's speak in full sentences and does not appear short of breath  Cardiovascular: No lower extremity edema, non tender, no erythema  Left knee has a trace effusion noted.  Instability noted with valgus and varus force noted.  ACL appears to be intact.  Severe positive McMurray's noted.  Tender to palpation over the medial joint line.  Mild swelling of the left calf compared to the contralateral side.   Limited muscular skeletal ultrasound was performed and interpreted by Ronnell Coins, M  Limited ultrasound shows the patient does have a abnormality noted of the medial meniscus that does appear to have a full-thickness tear noted with approximately 50% displacement.  Questionable even avulsion part to it noted.   Impression: Acute medial meniscal tear with potential rupture of the Baker's cyst   Impression and Recommendations:     The above documentation has been reviewed and is accurate and complete Ryson Bacha M Jaun Galluzzo, DO

## 2023-08-30 NOTE — Assessment & Plan Note (Signed)
 Instability that appears to be a medial meniscal tear on ultrasound.  I think we do need the MRI to further evaluate how deep this goes and what could be potentially done such as an arthroscopic procedure if needed.  Affecting daily activities, discussed icing regimen and home exercises, discussed which activities to avoid.  I do think patient also has a Baker's cyst that has ruptured recently that is also a concern at the moment.  Discussed with patient about icing regimen and home exercises.  Discussed which activities to do and which ones to avoid.  Follow-up again in 6 to 8 weeks otherwise.

## 2023-09-05 ENCOUNTER — Ambulatory Visit
Admission: RE | Admit: 2023-09-05 | Discharge: 2023-09-05 | Disposition: A | Discharge status: Home / Self Care | Source: Ambulatory Visit | Attending: Family Medicine | Admitting: Family Medicine

## 2023-09-05 DIAGNOSIS — M25562 Pain in left knee: Secondary | ICD-10-CM

## 2023-09-06 ENCOUNTER — Ambulatory Visit: Admitting: Sports Medicine

## 2023-09-07 ENCOUNTER — Ambulatory Visit: Payer: Self-pay | Admitting: Family Medicine

## 2023-09-10 ENCOUNTER — Other Ambulatory Visit

## 2023-09-19 ENCOUNTER — Other Ambulatory Visit: Payer: Self-pay | Admitting: Internal Medicine

## 2023-10-05 NOTE — Progress Notes (Signed)
 [facility] [address] [place] [address] Phone: [phone] Subjective:    I'm seeing this patient by the request  of:  Adelia Homestead, MD  CC: Left knee pain follow-up  [id number]  08/30/2023 Instability that appears to be a medial meniscal tear on ultrasound. I think we do need the MRI to further evaluate how deep this goes and what could be potentially done such as an arthroscopic procedure if needed. Affecting daily activities, discussed icing regimen and home exercises, discussed which activities to avoid. I do think patient also has a Baker's cyst that has ruptured recently that is also a concern at the moment. Discussed with patient about icing regimen and home exercises. Discussed which activities to do and which ones to avoid. Follow-up again in 6 to 8 weeks otherwise.   Updated 10/06/2023 Margaret [name] is a 68 y.o. female coming in with complaint of knee pain. Wants to go over MRI       Past Medical History:  Diagnosis Date   Arthritis    Endometrial cancer (HCC)    Exercise-induced asthma    Heart murmur    History of bradycardia 2011   had an episode of HR 38 at time of blood work w/ palpitations and SOB,  work-up done by cardiology (dr dalton Mitzie Anda) stress echo normal on 01/ 2012 and (12/ 2011) holter monitor with PACs with average HR 68/  07-14-2017 per pt no issues since then   Hx of colonic polyps    x3   IBS (irritable bowel syndrome)    OSA (obstructive sleep apnea) 07-14-2017 per had a new study on 07-12-2017 have not heard about results yet   per study 01-06-2005 moderate OSA (AHI 23.3/hr) used cpap, per pt lost weight withthe resolution of symptoms but has gain weight back   Type 2 diabetes mellitus (HCC)    Wears glasses    Past Surgical History:  Procedure Laterality Date   colonoscopy with polypectomy  2012 approx.   DOBUTAMINE  STRESS ECHO  04/2010   normal with no evidence of ischemia or  infarction   LAPAROSCOPIC CHOLECYSTECTOMY  1996   PLANTAR FASCIA RELEASE     ROBOTIC ASSISTED SUPRACERVICAL HYSTERECTOMY WITH BILATERAL SALPINGO OOPHERECTOMY N/A 07/19/2017   Procedure: XI ROBOTIC ASSISTED  HYSTERECTOMY WITH BILATERAL SALPINGO OOPHORECTOMY; SENTINEL LYMPH NODE BIOPSY;  Surgeon: Alphonso Aschoff, MD;  Location: [facility];  Service: Gynecology;  Laterality: N/A;   SENTINEL NODE BIOPSY N/A 07/19/2017   Procedure: SENTINEL NODE BIOPSY;  Surgeon: Alphonso Aschoff, MD;  Location: [facility];  Service: Gynecology;  Laterality: N/A;   TRANSTHORACIC ECHOCARDIOGRAM  04/17/2010   ef 55%/  trivial AR and TR/ left LAE/     Social History   Socioeconomic History   Marital status: Divorced    Spouse name: Not on file   Number of children: Not on file   Years of education: Not on file   Highest education level: Bachelor's degree (e.g., BA, AB, BS)  Occupational History   Occupation: Environmental health practitioner: [employer]  Tobacco Use   Smoking status: Former    Types: Cigarettes   Smokeless tobacco: Never   Tobacco comments:    07-14-2017 per pt Smoked [age] ONLY as teen  Vaping Use   Vaping status: Never Used  Substance and Sexual Activity   Alcohol use: Yes    Comment: rarely   Drug use: No   Sexual activity: Not  on file  Other Topics Concern   Not on file  Social History Narrative   Not on file   Social Drivers of Health   Financial Resource Strain: Low Risk  (04/22/2023)   Overall Financial Resource Strain (CARDIA)    Difficulty of Paying Living Expenses: Not very hard  Food Insecurity: No Food Insecurity (04/22/2023)   Hunger Vital Sign    Worried About Running Out of Food in the Last Year: Never true    Ran Out of Food in the Last Year: Never true  Transportation Needs: No Transportation Needs (04/22/2023)   PRAPARE - Administrator, Civil Service (Medical): No    Lack of Transportation (Non-Medical): No  Physical Activity: Sufficiently Active (04/22/2023)    Exercise Vital Sign    Days of Exercise per Week: 5 days    Minutes of Exercise per Session: 120 min  Stress: No Stress Concern Present (04/22/2023)   [employer] of Occupational Health - Occupational Stress Questionnaire    Feeling of Stress : Only a little  Social Connections: Moderately Isolated (04/22/2023)   Social Connection and Isolation Panel    Frequency of Communication with Friends and Family: More than three times a week    Frequency of Social Gatherings with Friends and Family: More than three times a week    Attends Religious Services: 1 to 4 times per year    Active Member of [employer] or Organizations: No    Attends Engineer, structural: Not on file    Marital Status: Divorced   Allergies  Allergen Reactions   Amoxicillin Hives   Other Anaphylaxis    TREE NUTS   Penicillins Hives and Rash    Hives Has patient had a PCN reaction causing immediate rash, facial/tongue/throat swelling, SOB or lightheadedness with hypotension: No Has patient had a PCN reaction causing severe rash involving mucus membranes or skin necrosis: No Has patient had a PCN reaction that required hospitalization: No Has patient had a PCN reaction occurring within the last 10 years: No If all of the above answers are NO, then may proceed with Cephalosporin use.  Hives Has patient had a PCN reaction causing immediate rash, facial/tongue/throat swelling, SOB or lightheadedness with hypotension: No Has patient had a PCN reaction causing severe rash involving mucus membranes or skin necrosis: No Has patient had a PCN reaction that required hospitalization: No Has patient had a PCN reaction occurring within the last 10 years: No If all of the above answers are NO, then may proceed with Cephalosporin use. Hives Has patient had a PCN reaction causing immediate rash, facial/tongue/throat swelling, SOB or lightheadedness with hypotension: No Has patient had a PCN reaction causing severe rash  involving mucus membranes or skin necrosis: No Has patient had a PCN reaction that required hospitalization: No Has patient had a PCN reaction occurring within the last 10 years: No If all of the above answers are NO, then may proceed with Cephalosporin use.   Macrodantin  [Nitrofurantoin Macrocrystal] Rash   Paroxetine Other (See Comments)    Headache , malaise   Family History  Problem Relation Age of Onset   Heart attack Mother 80   COPD Mother    Lung cancer Mother    Peripheral vascular disease Mother    Colon polyps Father    COPD Father    Diabetes Father        diet controlled   Hypertension Brother        2 bro  Stroke Neg Hx     Current Outpatient Medications (Endocrine & Metabolic):    glimepiride  (AMARYL ) 2 MG tablet, Take 1 tablet (2 mg total) by mouth daily before breakfast.   metFORMIN  (GLUCOPHAGE -XR) 500 MG 24 hr tablet, Take 2 tablets (1,000 mg total) by mouth 2 (two) times daily.  Current Outpatient Medications (Cardiovascular):    EPINEPHrine  0.3 mg/0.3 mL IJ SOAJ injection, Inject 0.3 mg into the muscle as needed for anaphylaxis.   lisinopril  (ZESTRIL ) 2.5 MG tablet, Take 1 tablet (2.5 mg total) by mouth daily.   pravastatin  (PRAVACHOL ) 40 MG tablet, Take 1 tablet (40 mg total) by mouth daily.   Current Outpatient Medications (Analgesics):    celecoxib  (CELEBREX ) 100 MG capsule, Take 1 capsule (100 mg total) by mouth 2 (two) times daily.   Current Outpatient Medications (Other):    Melatonin 5 MG CHEW, Chew 3 each by mouth as needed.   Omega-3 Fatty Acids (FISH OIL ADULT GUMMIES PO), Take by mouth daily.   Reviewed prior external information including notes and imaging from  primary care provider As well as notes that were available from care everywhere and other healthcare systems.  Past medical history, social, surgical and family history all reviewed in electronic medical record.  No pertanent information unless stated regarding to the chief  complaint.   Review of Systems:  No headache, visual changes, nausea, vomiting, diarrhea, constipation, dizziness, abdominal pain, skin rash, fevers, chills, night sweats, weight loss, swollen lymph nodes, body aches,  chest pain, shortness of breath, mood changes. POSITIVE muscle aches, joint swelling  Objective  Blood pressure 114/86, pulse (!) 115, height 5' 4 (1.626 m), weight 210 lb (95.3 kg), SpO2 95%.   General: No apparent distress alert and oriented x3 mood and affect normal, dressed appropriately.  HEENT: Pupils equal, extraocular movements intact  Respiratory: Patient's speak in full sentences and does not appear short of breath  Cardiovascular: No lower extremity edema, non tender, no erythema  Antalgic gait favoring the left knee.  Patient does have some tenderness to palpation in the left knee.  Tender over the medial joint line more than anywhere else.  After informed written and verbal consent, patient was seated on exam table. Left knee was prepped with alcohol swab and utilizing anterolateral approach, patient's left knee space was injected with 4:1  marcaine 0.5%: Kenalog  40mg /dL. Patient tolerated the procedure well without immediate complications.    Impression and Recommendations:

## 2023-10-06 ENCOUNTER — Encounter: Payer: Self-pay | Admitting: Family Medicine

## 2023-10-06 ENCOUNTER — Ambulatory Visit: Admitting: Family Medicine

## 2023-10-06 VITALS — BP 114/86 | HR 115 | Ht 64.0 in | Wt 210.0 lb

## 2023-10-06 DIAGNOSIS — M222X2 Patellofemoral disorders, left knee: Secondary | ICD-10-CM | POA: Diagnosis not present

## 2023-10-06 NOTE — Patient Instructions (Addendum)
 Injection in knee today See you again in 2-3 months Okay to increase activity

## 2023-10-06 NOTE — Assessment & Plan Note (Signed)
 Arthritic changes as well as fraying of the posterior medial meniscus noted on MRI.  Do not see anything that would be surgical.  Hopeful that injection will be significantly helpful for this individual.  Will be helping with some family move and wants to be more active.  Discussed icing regimen exercises otherwise.  The patient to reduce slowly.  Follow-up again in 6 to 8 weeks.

## 2023-10-10 ENCOUNTER — Encounter: Payer: Self-pay | Admitting: Internal Medicine

## 2023-10-13 DIAGNOSIS — L570 Actinic keratosis: Secondary | ICD-10-CM | POA: Diagnosis not present

## 2023-10-13 DIAGNOSIS — D492 Neoplasm of unspecified behavior of bone, soft tissue, and skin: Secondary | ICD-10-CM | POA: Diagnosis not present

## 2023-10-13 DIAGNOSIS — C44319 Basal cell carcinoma of skin of other parts of face: Secondary | ICD-10-CM | POA: Diagnosis not present

## 2023-10-19 NOTE — Telephone Encounter (Signed)
**Note De-identified  [place] Obfuscation** Please advise 

## 2023-10-24 MED ORDER — GLIMEPIRIDE 1 MG PO TABS: 1.0000 mg | ORAL_TABLET | Freq: Every day | ORAL | 1 refills | Status: DC

## 2023-10-24 NOTE — Addendum Note (Signed)
 Addended by: ROLLENE NORRIS A on: 10/24/2023 02:21 PM   Modules accepted: Orders

## 2023-11-14 ENCOUNTER — Ambulatory Visit: Admitting: Internal Medicine

## 2023-11-14 DIAGNOSIS — C44319 Basal cell carcinoma of skin of other parts of face: Secondary | ICD-10-CM | POA: Diagnosis not present

## 2023-11-20 ENCOUNTER — Other Ambulatory Visit: Payer: Self-pay | Admitting: Internal Medicine

## 2023-11-21 ENCOUNTER — Ambulatory Visit (INDEPENDENT_AMBULATORY_CARE_PROVIDER_SITE_OTHER): Admitting: Internal Medicine

## 2023-11-21 ENCOUNTER — Encounter: Payer: Self-pay | Admitting: Internal Medicine

## 2023-11-21 VITALS — BP 120/84 | HR 76 | Temp 98.3°F | Ht 64.0 in | Wt 216.0 lb

## 2023-11-21 DIAGNOSIS — Z7984 Long term (current) use of oral hypoglycemic drugs: Secondary | ICD-10-CM

## 2023-11-21 DIAGNOSIS — E118 Type 2 diabetes mellitus with unspecified complications: Secondary | ICD-10-CM | POA: Diagnosis not present

## 2023-11-21 LAB — POCT GLYCOSYLATED HEMOGLOBIN (HGB A1C): HbA1c POC (<> result, manual entry): 8.3 %

## 2023-11-21 NOTE — Progress Notes (Signed)
 Subjective:   Patient ID: Margaret [name], female    DOB: [birth year], 68 y.o.   MRN: [id number]  Discussed the use of AI scribe software for clinical note transcription with the patient, who gave verbal consent to proceed. History of Present Illness Margaret [name] is a 68 year old female with type 2 diabetes who presents with concerns about weight gain and blood sugar control.  She has gained approximately ten pounds since her last visit a couple of months ago, which she attributes to a change in her diabetes medication. She is concerned about the cost of Jardiance , which she previously used and found effective in preventing weight gain, although it did not significantly contribute to weight loss.  Her current A1c is 8.3, a slight decrease from 8.4 at her last visit, but she notes it has been better in the past. She experiences low blood sugar episodes, particularly on weekends when her eating habits differ, with levels dropping to as low as 64 mg/dL. She uses glucose tablets to manage these episodes and has adjusted her medication to stabilize her blood sugar levels.  She had a challenging summer, including a torn meniscus and a ruptured Baker's cyst, which limited her mobility from May to mid-June, contributing to her weight gain. Additionally, she recently had a skin cancer removed from her head.  Her diet is inconsistent, being 'really good sometimes and really bad sometimes,' influenced by social interactions. She does not consume alcohol regularly, having cut it out in her early forties, and only occasionally has a glass of wine. She is concerned about the long-term implications of weight gain and its impact on her health.  Review of Systems  Constitutional: Negative.   HENT: Negative.    Eyes: Negative.   Respiratory:  Negative for cough, chest tightness and shortness of breath.   Cardiovascular:  Negative for chest pain, palpitations and leg swelling.  Gastrointestinal:  Negative  for abdominal distention, abdominal pain, constipation, diarrhea, nausea and vomiting.  Musculoskeletal: Negative.   Skin: Negative.   Neurological: Negative.   Psychiatric/Behavioral: Negative.      Objective:  Physical Exam Constitutional:      Appearance: She is well-developed.  HENT:     Head: Normocephalic and atraumatic.  Cardiovascular:     Rate and Rhythm: Normal rate and regular rhythm.  Pulmonary:     Effort: Pulmonary effort is normal. No respiratory distress.     Breath sounds: Normal breath sounds. No wheezing or rales.  Abdominal:     General: Bowel sounds are normal. There is no distension.     Palpations: Abdomen is soft.     Tenderness: There is no abdominal tenderness. There is no rebound.  Musculoskeletal:     Cervical back: Normal range of motion.  Skin:    General: Skin is warm and dry.  Neurological:     Mental Status: She is alert and oriented to person, place, and time.     Coordination: Coordination normal.     Vitals:   11/21/23 0854  BP: 120/84  Pulse: 76  Temp: 98.3 F (36.8 C)  TempSrc: Oral  SpO2: 99%  Weight: 216 lb (98 kg)  Height: 5' 4 (1.626 m)    Assessment and Plan Assessment & Plan Type 2 diabetes mellitus with weight gain   Type 2 diabetes with weight gain is likely due to the switch from Jardiance  to Amaryl . Her A1c is 8.3, showing slight improvement but still above target. Weight gain with Amaryl  is  a concern. The cost of Jardiance  affects the decision. Contact [employer] to determine the cost of Jardiance . Consider switching back to Jardiance  if it is affordable, as it benefits weight management and does not require weaning off Amaryl . Text the provider with the decision regarding the Jardiance  switch.

## 2023-11-21 NOTE — Assessment & Plan Note (Signed)
 POC HgA1c done and 8.3 today. This is stable from prior and above goal. She would like to check on cost of resuming jardiance  and stopping amaryl . She is taking amaryl  1 mg daily and metformin  2000 mg daily. Is on statin and ACE-I. Follow up 3 months.

## 2023-12-05 ENCOUNTER — Other Ambulatory Visit: Payer: Self-pay | Admitting: Family Medicine

## 2023-12-14 DIAGNOSIS — L814 Other melanin hyperpigmentation: Secondary | ICD-10-CM | POA: Diagnosis not present

## 2023-12-14 DIAGNOSIS — D225 Melanocytic nevi of trunk: Secondary | ICD-10-CM | POA: Diagnosis not present

## 2023-12-14 DIAGNOSIS — Z08 Encounter for follow-up examination after completed treatment for malignant neoplasm: Secondary | ICD-10-CM | POA: Diagnosis not present

## 2023-12-14 DIAGNOSIS — L821 Other seborrheic keratosis: Secondary | ICD-10-CM | POA: Diagnosis not present

## 2023-12-14 DIAGNOSIS — Z85828 Personal history of other malignant neoplasm of skin: Secondary | ICD-10-CM | POA: Diagnosis not present

## 2023-12-17 ENCOUNTER — Other Ambulatory Visit: Payer: Self-pay | Admitting: Internal Medicine

## 2023-12-21 ENCOUNTER — Other Ambulatory Visit: Payer: Self-pay | Admitting: Internal Medicine

## 2023-12-25 ENCOUNTER — Other Ambulatory Visit: Payer: Self-pay | Admitting: Internal Medicine

## 2023-12-26 ENCOUNTER — Other Ambulatory Visit: Payer: Self-pay

## 2023-12-26 ENCOUNTER — Encounter: Payer: Self-pay | Admitting: Internal Medicine

## 2023-12-29 DIAGNOSIS — H52223 Regular astigmatism, bilateral: Secondary | ICD-10-CM | POA: Diagnosis not present

## 2023-12-29 DIAGNOSIS — E119 Type 2 diabetes mellitus without complications: Secondary | ICD-10-CM | POA: Diagnosis not present

## 2024-01-04 NOTE — Progress Notes (Unsigned)
 [name] [address] [place] [address] Phone: [phone] Subjective:   Margaret [name], am serving as a scribe for Dr. Arthea Claudene.  I'm seeing this patient by the request  of:  Rollene Almarie LABOR, MD  CC: Left knee pain and right shoulder pain follow-up  YEP:Dlagzrupcz  10/06/2023 Arthritic changes as well as fraying of the posterior medial meniscus noted on MRI.  Do not see anything that would be surgical.  Hopeful that injection will be significantly helpful for this individual.  Will be helping with some family move and wants to be more active.  Discussed icing regimen exercises otherwise.  The patient to reduce slowly.  Follow-up again in 6 to 8 weeks.      Update 01/05/2024 Margaret [name] is a 68 y.o. female coming in with complaint of L knee pain. Patient states that her knee is doing much better.   R shoulder is painful intermittently. Also has R hip pain when it get cold.        Past Medical History:  Diagnosis Date   Arthritis    Endometrial cancer (HCC)    Exercise-induced asthma    Heart murmur    History of bradycardia 2011   had an episode of HR 38 at time of blood work w/ palpitations and SOB,  work-up done by cardiology (dr dalton rolan) stress echo normal on 01/ 2012 and (12/ 2011) holter monitor with PACs with average HR 68/  07-14-2017 per pt no issues since then   Hx of colonic polyps    x3   IBS (irritable bowel syndrome)    OSA (obstructive sleep apnea) 07-14-2017 per had a new study on 07-12-2017 have not heard about results yet   per study 01-06-2005 moderate OSA (AHI 23.3/hr) used cpap, per pt lost weight withthe resolution of symptoms but has gain weight back   Type 2 diabetes mellitus (HCC)    Wears glasses    Past Surgical History:  Procedure Laterality Date   colonoscopy with polypectomy  2012 approx.   DOBUTAMINE  STRESS ECHO  04/2010   normal with no evidence of ischemia or infarction    LAPAROSCOPIC CHOLECYSTECTOMY  1996   PLANTAR FASCIA RELEASE     ROBOTIC ASSISTED SUPRACERVICAL HYSTERECTOMY WITH BILATERAL SALPINGO OOPHERECTOMY N/A 07/19/2017   Procedure: XI ROBOTIC ASSISTED  HYSTERECTOMY WITH BILATERAL SALPINGO OOPHORECTOMY; SENTINEL LYMPH NODE BIOPSY;  Surgeon: Eloy Herring, MD;  Location: [facility];  Service: Gynecology;  Laterality: N/A;   SENTINEL NODE BIOPSY N/A 07/19/2017   Procedure: SENTINEL NODE BIOPSY;  Surgeon: Eloy Herring, MD;  Location: [facility];  Service: Gynecology;  Laterality: N/A;   TRANSTHORACIC ECHOCARDIOGRAM  04/17/2010   ef 55%/  trivial AR and TR/ left LAE/     Social History   Socioeconomic History   Marital status: Divorced    Spouse name: Not on file   Number of children: Not on file   Years of education: Not on file   Highest education level: Bachelor's degree (e.g., BA, AB, BS)  Occupational History   Occupation: Sports coach    Employer: [employer]  Tobacco Use   Smoking status: Former    Types: Cigarettes   Smokeless tobacco: Never   Tobacco comments:    07-14-2017 per pt Smoked [age] ONLY as teen  Vaping Use   Vaping status: Never Used  Substance and Sexual Activity   Alcohol use: Yes    Comment: rarely  Drug use: No   Sexual activity: Not on file  Other Topics Concern   Not on file  Social History Narrative   Not on file   Social Drivers of Health   Financial Resource Strain: Low Risk  (04/22/2023)   Overall Financial Resource Strain (CARDIA)    Difficulty of Paying Living Expenses: Not very hard  Food Insecurity: No Food Insecurity (04/22/2023)   Hunger Vital Sign    Worried About Running Out of Food in the Last Year: Never true    Ran Out of Food in the Last Year: Never true  Transportation Needs: No Transportation Needs (04/22/2023)   PRAPARE - Administrator, Civil Service (Medical): No    Lack of Transportation (Non-Medical): No  Physical Activity: Sufficiently Active (04/22/2023)   Exercise Vital  Sign    Days of Exercise per Week: 5 days    Minutes of Exercise per Session: 120 min  Stress: No Stress Concern Present (04/22/2023)   [employer] of Occupational Health - Occupational Stress Questionnaire    Feeling of Stress : Only a little  Social Connections: Moderately Isolated (04/22/2023)   Social Connection and Isolation Panel    Frequency of Communication with Friends and Family: More than three times a week    Frequency of Social Gatherings with Friends and Family: More than three times a week    Attends Religious Services: 1 to 4 times per year    Active Member of [employer] or Organizations: No    Attends Engineer, structural: Not on file    Marital Status: Divorced   Allergies  Allergen Reactions   Amoxicillin Hives   Other Anaphylaxis    TREE NUTS   Penicillins Hives and Rash    Hives Has patient had a PCN reaction causing immediate rash, facial/tongue/throat swelling, SOB or lightheadedness with hypotension: No Has patient had a PCN reaction causing severe rash involving mucus membranes or skin necrosis: No Has patient had a PCN reaction that required hospitalization: No Has patient had a PCN reaction occurring within the last 10 years: No If all of the above answers are NO, then may proceed with Cephalosporin use.  Hives Has patient had a PCN reaction causing immediate rash, facial/tongue/throat swelling, SOB or lightheadedness with hypotension: No Has patient had a PCN reaction causing severe rash involving mucus membranes or skin necrosis: No Has patient had a PCN reaction that required hospitalization: No Has patient had a PCN reaction occurring within the last 10 years: No If all of the above answers are NO, then may proceed with Cephalosporin use. Hives Has patient had a PCN reaction causing immediate rash, facial/tongue/throat swelling, SOB or lightheadedness with hypotension: No Has patient had a PCN reaction causing severe rash involving mucus  membranes or skin necrosis: No Has patient had a PCN reaction that required hospitalization: No Has patient had a PCN reaction occurring within the last 10 years: No If all of the above answers are NO, then may proceed with Cephalosporin use.   Macrodantin  [Nitrofurantoin Macrocrystal] Rash   Paroxetine Other (See Comments)    Headache , malaise   Family History  Problem Relation Age of Onset   Heart attack Mother 61   COPD Mother    Lung cancer Mother    Peripheral vascular disease Mother    Colon polyps Father    COPD Father    Diabetes Father        diet controlled   Hypertension Brother  2 bro   Stroke Neg Hx     Current Outpatient Medications (Endocrine & Metabolic):    glimepiride  (AMARYL ) 1 MG tablet, TAKE 1 TABLET BY MOUTH DAILY WITH BREAKFAST.   metFORMIN  (GLUCOPHAGE -XR) 500 MG 24 hr tablet, Take 2 tablets (1,000 mg total) by mouth 2 (two) times daily.  Current Outpatient Medications (Cardiovascular):    EPINEPHrine  0.3 mg/0.3 mL IJ SOAJ injection, Inject 0.3 mg into the muscle as needed for anaphylaxis.   lisinopril  (ZESTRIL ) 2.5 MG tablet, TAKE 1 TABLET BY MOUTH EVERY DAY   pravastatin  (PRAVACHOL ) 40 MG tablet, Take 1 tablet (40 mg total) by mouth daily.   Current Outpatient Medications (Analgesics):    celecoxib  (CELEBREX ) 100 MG capsule, TAKE 1 CAPSULE BY MOUTH TWICE A DAY   Current Outpatient Medications (Other):    Melatonin 5 MG CHEW, Chew 3 each by mouth as needed.   Omega-3 Fatty Acids (FISH OIL ADULT GUMMIES PO), Take by mouth daily.   Reviewed prior external information including notes and imaging from  primary care provider As well as notes that were available from care everywhere and other healthcare systems.  Past medical history, social, surgical and family history all reviewed in electronic medical record.  No pertanent information unless stated regarding to the chief complaint.   Review of Systems:  No headache, visual changes,  nausea, vomiting, diarrhea, constipation, dizziness, abdominal pain, skin rash, fevers, chills, night sweats, weight loss, swollen lymph nodes, body aches, joint swelling, chest pain, shortness of breath, mood changes. POSITIVE muscle aches  Objective  Blood pressure 106/72, pulse 84, height 5' 4 (1.626 m), weight 217 lb (98.4 kg), SpO2 96%.   General: No apparent distress alert and oriented x3 mood and affect normal, dressed appropriately.  HEENT: Pupils equal, extraocular movements intact  Respiratory: Patient's speak in full sentences and does not appear short of breath  Cardiovascular: No lower extremity edema, non tender, no erythema  Patient is walking relatively well.  No significant discomfort in the knee at all noted today.  Patient's right shoulder very mild impingement noted.  Other than that fairly good range of motion.    Impression and Recommendations:

## 2024-01-06 ENCOUNTER — Ambulatory Visit: Admitting: Family Medicine

## 2024-01-06 ENCOUNTER — Encounter: Payer: Self-pay | Admitting: Family Medicine

## 2024-01-06 VITALS — BP 106/72 | HR 84 | Ht 64.0 in | Wt 217.0 lb

## 2024-01-06 DIAGNOSIS — E118 Type 2 diabetes mellitus with unspecified complications: Secondary | ICD-10-CM

## 2024-01-06 DIAGNOSIS — M19011 Primary osteoarthritis, right shoulder: Secondary | ICD-10-CM | POA: Diagnosis not present

## 2024-01-06 DIAGNOSIS — M222X2 Patellofemoral disorders, left knee: Secondary | ICD-10-CM

## 2024-01-06 NOTE — Assessment & Plan Note (Signed)
 Patient is doing much better at this time.  No need for any type of injection.  Discussed ergonomic changes including a vertical mouse that I think will be beneficial.  Follow-up again in 6 to 8 weeks

## 2024-01-06 NOTE — Assessment & Plan Note (Signed)
 Last A1c became more elevated and patient is gaining weight on glipizide.  Will talk to primary care provider to see if any other treatment options are available

## 2024-01-06 NOTE — Patient Instructions (Signed)
 See me in 2-3 months Vertical mouse Keep doing exercises I will check with Dr. Rollene

## 2024-01-06 NOTE — Assessment & Plan Note (Signed)
 Stable at the moment as well.  Increase activity slowly.  No need for repeat injection at this time.  Do feel that if patient could get more weight loss she would feel better.  Having some difficulty with her diabetes as well discussed talking to primary care provider with the possibility of endocrinology if continuing to have difficulty.

## 2024-01-09 ENCOUNTER — Encounter (INDEPENDENT_AMBULATORY_CARE_PROVIDER_SITE_OTHER): Payer: Self-pay | Admitting: Otolaryngology

## 2024-01-09 ENCOUNTER — Ambulatory Visit (INDEPENDENT_AMBULATORY_CARE_PROVIDER_SITE_OTHER): Admitting: Otolaryngology

## 2024-01-09 VITALS — BP 124/86 | HR 83 | Ht 64.0 in | Wt 210.0 lb

## 2024-01-09 DIAGNOSIS — H608X3 Other otitis externa, bilateral: Secondary | ICD-10-CM

## 2024-01-09 DIAGNOSIS — H6123 Impacted cerumen, bilateral: Secondary | ICD-10-CM | POA: Diagnosis not present

## 2024-01-09 DIAGNOSIS — H903 Sensorineural hearing loss, bilateral: Secondary | ICD-10-CM

## 2024-01-09 MED ORDER — FLUOCINOLONE ACETONIDE 0.01 % OT OIL: 2.0000 [drp] | TOPICAL_OIL | Freq: Every day | OTIC | 1 refills | Status: AC

## 2024-01-09 MED ORDER — BETAMETHASONE DIPROPIONATE 0.05 % EX CREA
TOPICAL_CREAM | Freq: Every day | CUTANEOUS | 0 refills | Status: AC
Start: 2024-01-09 — End: 2024-01-19

## 2024-01-09 NOTE — Patient Instructions (Signed)
 Use betamethasone  cream just on outside of ear canal nightly; use 2-3 drops dermotic  oil nightly for 10 days --- keep hearing aids out when doing this for about 8-10 hours.

## 2024-01-09 NOTE — Progress Notes (Signed)
 Dear Dr. Rollene, Here is my assessment for our mutual patient, Margaret [name]. Thank you for allowing me the opportunity to care for your patient. Please do not hesitate to contact me should you have any other questions. Sincerely, Dr. Eldora Blanch  Otolaryngology Clinic Note Referring provider: Dr. Rollene HPI:  Margaret [name] is a 68 y.o. female kindly referred by Dr. Rollene for evaluation of possible fungal otitis externa  Initial visit (12/2023): She reports that she started to get hearing aids about a year ago and has been going to have them adjusted and was noted to have possible fungal otitis externa on the right side. She was having some discomfort prior but none currently. No h/o frequent ear infections or issues. Does have b/l itching (and eczema of scalp?). Has mild b/l ear fullness and chronic itching. Does have long-standing chronic b/l HL Patient denies: ear pain, fullness, vertigo, drainage, tinnitus Patient additionally denies: deep pain in ear canal, eustachian tube symptoms such as popping/crackling, sensitive to pressure changes Patient also denies barotrauma, vestibular suppressant use, ototoxic medication use Prior ear surgery: no  Personal or FHx of bleeding dz or anesthesia difficulty: no  AP/AC: no  Tobacco: remote hx, quit  PMHx: DM, HTN, PAC, OSA  Independent Review of Additional Tests or Records:  Dr. Rollene (11/21/2023): noted diabetes with A1c - no ear infection noted at this time CBC and CMP 05/16/2023: WBC 8.2, BUN/Cr 21/0.76 [facility] 03/31/2008 independently interpreted with respect to ears: cuts thick so suboptimal evaluation but mastoids and ME appear well aerated; perhaps some cerumen right EAC, but no noted bony erosion PMH/Meds/All/SocHx/FamHx/ROS:   Past Medical History:  Diagnosis Date   Arthritis    Endometrial cancer (HCC)    Exercise-induced asthma    Heart murmur    History of bradycardia 2011   had an episode of HR 38 at time of blood  work w/ palpitations and SOB,  work-up done by cardiology (dr dalton rolan) stress echo normal on 01/ 2012 and (12/ 2011) holter monitor with PACs with average HR 68/  07-14-2017 per pt no issues since then   Hx of colonic polyps    x3   IBS (irritable bowel syndrome)    OSA (obstructive sleep apnea) 07-14-2017 per had a new study on 07-12-2017 have not heard about results yet   per study 01-06-2005 moderate OSA (AHI 23.3/hr) used cpap, per pt lost weight withthe resolution of symptoms but has gain weight back   Type 2 diabetes mellitus (HCC)    Wears glasses      Past Surgical History:  Procedure Laterality Date   colonoscopy with polypectomy  2012 approx.   DOBUTAMINE  STRESS ECHO  04/2010   normal with no evidence of ischemia or infarction   LAPAROSCOPIC CHOLECYSTECTOMY  1996   PLANTAR FASCIA RELEASE     ROBOTIC ASSISTED SUPRACERVICAL HYSTERECTOMY WITH BILATERAL SALPINGO OOPHERECTOMY N/A 07/19/2017   Procedure: XI ROBOTIC ASSISTED  HYSTERECTOMY WITH BILATERAL SALPINGO OOPHORECTOMY; SENTINEL LYMPH NODE BIOPSY;  Surgeon: Eloy Herring, MD;  Location: [facility];  Service: Gynecology;  Laterality: N/A;   SENTINEL NODE BIOPSY N/A 07/19/2017   Procedure: SENTINEL NODE BIOPSY;  Surgeon: Eloy Herring, MD;  Location: [facility];  Service: Gynecology;  Laterality: N/A;   TRANSTHORACIC ECHOCARDIOGRAM  04/17/2010   ef 55%/  trivial AR and TR/ left LAE/      Family History  Problem Relation Age of Onset   Heart attack Mother 42   COPD Mother    Lung cancer Mother  Peripheral vascular disease Mother    Colon polyps Father    COPD Father    Diabetes Father        diet controlled   Hypertension Brother        2 bro   Stroke Neg Hx      Social Connections: Moderately Isolated (04/22/2023)   Social Connection and Isolation Panel    Frequency of Communication with Friends and Family: More than three times a week    Frequency of Social Gatherings with Friends and Family: More than three times a week     Attends Religious Services: 1 to 4 times per year    Active Member of [employer] or Organizations: No    Attends Engineer, structural: Not on file    Marital Status: Divorced      Current Outpatient Medications:    betamethasone  dipropionate 0.05 % cream, Apply topically at bedtime for 10 days., Disp: 30 g, Rfl: 0   Fluocinolone  Acetonide (DERMOTIC ) 0.01 % OIL, Place 2 drops in ear(s) at bedtime for 10 days., Disp: 20 mL, Rfl: 1   celecoxib  (CELEBREX ) 100 MG capsule, TAKE 1 CAPSULE BY MOUTH TWICE A DAY, Disp: 60 capsule, Rfl: 3   EPINEPHrine  0.3 mg/0.3 mL IJ SOAJ injection, Inject 0.3 mg into the muscle as needed for anaphylaxis., Disp: 1 each, Rfl: 0   glimepiride  (AMARYL ) 1 MG tablet, TAKE 1 TABLET BY MOUTH DAILY WITH BREAKFAST., Disp: 30 tablet, Rfl: 1   lisinopril  (ZESTRIL ) 2.5 MG tablet, TAKE 1 TABLET BY MOUTH EVERY DAY, Disp: 90 tablet, Rfl: 1   Melatonin 5 MG CHEW, Chew 3 each by mouth as needed., Disp: , Rfl:    metFORMIN  (GLUCOPHAGE -XR) 500 MG 24 hr tablet, Take 2 tablets (1,000 mg total) by mouth 2 (two) times daily., Disp: 360 tablet, Rfl: 3   Omega-3 Fatty Acids (FISH OIL ADULT GUMMIES PO), Take by mouth daily., Disp: , Rfl:    pravastatin  (PRAVACHOL ) 40 MG tablet, Take 1 tablet (40 mg total) by mouth daily., Disp: 90 tablet, Rfl: 3   Physical Exam:   BP 124/86 (BP Location: Right Arm, Patient Position: Sitting, Cuff Size: Large)   Pulse 83   Ht 5' 4 (1.626 m)   Wt 210 lb (95.3 kg)   SpO2 95%   BMI 36.05 kg/m   Salient findings:  CN II-XII intact Given history and complaints, ear microscopy was indicated and performed for evaluation with findings as below in physical exam section and in procedures Bilateral EAC with cerumen impaction, but no fungal debris noted (likely just epithelium mixed with wax); after clearance, TM intact with well pneumatized middle ear spaces; modest eczematoid change b/l EAC Weber 512: mid Rinne 512: AC > BC b/l   Anterior rhinoscopy:  Septum intact; bilateral inferior turbinates without significant hypertrophy No lesions of oral cavity/oropharynx No respiratory distress or stridor  Seprately Identifiable Procedures:  Prior to initiating any procedures, risks/benefits/alternatives were explained to the patient and verbal consent obtained. Procedure: Bilateral ear microscopy and cerumen removal using microscope (CPT [phone]) - Mod 25 Pre-procedure diagnosis: Cerumen impaction bilateral external ears Post-procedure diagnosis: same Indication: bilateral cerumen impaction; given patient's otologic complaints and history as well as for improved and comprehensive examination of external ear and tympanic membrane, bilateral otologic examination using microscope was performed and impacted cerumen removed  Procedure: Patient was placed semi-recumbent. Both ear canals were examined using the microscope with findings above. Impacted cerumen removed on left and on right using suction and currette  with improvement in EAC examination and patency. Patient tolerated the procedure well.      Impression & Plans:  Margaret [name] is a 68 y.o. female with:  1. Sensorineural hearing loss (SNHL) of both ears   2. Bilateral impacted cerumen   3. Chronic eczematous otitis externa of both ears    Cerumen cleared, no evidence of fungal infection; SNHL: long-standing, wears HA; d/w pt re: formal hearing eval here but she'd like to forego as she recently had HT at outside facility Chronic b/l eczematoid OE: will treat with topical steroid and steroid drops as below BID x10d  D/w pt f/u, she opted PRN  See below regarding exact medications prescribed this encounter including dosages and route: Meds ordered this encounter  Medications   betamethasone  dipropionate 0.05 % cream    Sig: Apply topically at bedtime for 10 days.    Dispense:  30 g    Refill:  0   Fluocinolone  Acetonide (DERMOTIC ) 0.01 % OIL    Sig: Place 2 drops in ear(s) at  bedtime for 10 days.    Dispense:  20 mL    Refill:  1      Thank you for allowing me the opportunity to care for your patient. Please do not hesitate to contact me should you have any other questions.  Sincerely, Eldora Blanch, MD Otolaryngologist (ENT), [facility] Phone: [phone] Fax: [phone]  01/09/2024, 7:39 PM   MDM:  Level 4 - [phone] Complexity/Problems addressed: mod - multiple chronic problems Data complexity: mod - independent interpretation of imaging - Morbidity: mod  - Prescription Drug prescribed or managed: y

## 2024-01-11 ENCOUNTER — Encounter: Payer: Self-pay | Admitting: *Deleted

## 2024-01-11 NOTE — Progress Notes (Signed)
 LASHAYA KIENITZ                                          MRN: [id number]   01/11/2024   The [facility] Quality Team Specialist reviewed this patient medical record for the purposes of chart review for care gap closure. The following were reviewed: chart review for care gap closure-kidney health evaluation for diabetes:eGFR  and uACR.    [facility] Quality Team

## 2024-01-11 NOTE — Progress Notes (Signed)
 Margaret [name]                                          MRN: [id number]   01/11/2024   The [facility] Quality Team Specialist reviewed this patient medical record for the purposes of chart review for care gap closure. The following were reviewed: abstraction for care gap closure-controlling blood pressure, diabetic eye exam, and glycemic status assessment.    [facility] Quality Team

## 2024-01-27 ENCOUNTER — Encounter: Payer: Self-pay | Admitting: Internal Medicine

## 2024-02-20 ENCOUNTER — Encounter: Payer: Self-pay | Admitting: Radiology

## 2024-02-21 ENCOUNTER — Other Ambulatory Visit: Payer: Self-pay | Admitting: Internal Medicine

## 2024-03-12 ENCOUNTER — Other Ambulatory Visit: Payer: Self-pay | Admitting: Internal Medicine

## 2024-03-13 ENCOUNTER — Encounter: Payer: Self-pay | Admitting: Internal Medicine

## 2024-03-14 NOTE — Telephone Encounter (Signed)
 Spoke with pt. Appt scheduled for 03/20/24@220pm .

## 2024-03-20 ENCOUNTER — Encounter: Payer: Self-pay | Admitting: Internal Medicine

## 2024-03-20 ENCOUNTER — Other Ambulatory Visit: Payer: Self-pay | Admitting: Internal Medicine

## 2024-03-20 ENCOUNTER — Ambulatory Visit (INDEPENDENT_AMBULATORY_CARE_PROVIDER_SITE_OTHER): Admitting: Internal Medicine

## 2024-03-20 VITALS — BP 130/70 | HR 62 | Temp 98.2°F | Ht 64.0 in | Wt 219.8 lb

## 2024-03-20 DIAGNOSIS — Z7984 Long term (current) use of oral hypoglycemic drugs: Secondary | ICD-10-CM | POA: Diagnosis not present

## 2024-03-20 DIAGNOSIS — E118 Type 2 diabetes mellitus with unspecified complications: Secondary | ICD-10-CM

## 2024-03-20 LAB — POCT GLYCOSYLATED HEMOGLOBIN (HGB A1C): Hemoglobin A1C: 9.8 % — AB (ref 4.0–5.6)

## 2024-03-20 MED ORDER — EMPAGLIFLOZIN 25 MG PO TABS: 25.0000 mg | ORAL_TABLET | Freq: Every day | ORAL | 3 refills | Status: DC

## 2024-03-20 MED ORDER — FREESTYLE LIBRE 3 READER DEVI: 11 refills | Status: AC

## 2024-03-20 MED ORDER — FREESTYLE LIBRE 3 PLUS SENSOR MISC: 3 refills | Status: AC

## 2024-03-20 MED ORDER — EMPAGLIFLOZIN 10 MG PO TABS: 10.0000 mg | ORAL_TABLET | Freq: Every day | ORAL | 0 refills | Status: DC

## 2024-03-20 NOTE — Patient Instructions (Signed)
 It is okay to stop glimepiride  and start jardiance . For the first month do 1 pill (10 mg) daily. Then do 25 mg daily (2 separate prescriptions).

## 2024-03-20 NOTE — Progress Notes (Unsigned)
   Subjective:   Patient ID: Margaret [name], female    DOB: 08/04/1955, 68 y.o.   MRN: [id number]  The patient is a 68 YO female coming in for diabetes follow up. No new concerns.   Review of Systems  Constitutional: Negative.   HENT: Negative.    Eyes: Negative.   Respiratory:  Negative for cough, chest tightness and shortness of breath.   Cardiovascular:  Negative for chest pain, palpitations and leg swelling.  Gastrointestinal:  Negative for abdominal distention, abdominal pain, constipation, diarrhea, nausea and vomiting.  Musculoskeletal: Negative.   Skin: Negative.   Neurological: Negative.   Psychiatric/Behavioral: Negative.      Objective:  Physical Exam Constitutional:      Appearance: She is well-developed.  HENT:     Head: Normocephalic and atraumatic.  Cardiovascular:     Rate and Rhythm: Normal rate and regular rhythm.  Pulmonary:     Effort: Pulmonary effort is normal. No respiratory distress.     Breath sounds: Normal breath sounds. No wheezing or rales.  Abdominal:     General: Bowel sounds are normal. There is no distension.     Palpations: Abdomen is soft.     Tenderness: There is no abdominal tenderness.  Musculoskeletal:     Cervical back: Normal range of motion.  Skin:    General: Skin is warm and dry.  Neurological:     Mental Status: She is alert and oriented to person, place, and time.     Coordination: Coordination normal.     Vitals:   03/20/24 1425  BP: 130/70  Pulse: 62  Temp: 98.2 F (36.8 C)  TempSrc: Oral  SpO2: 99%  Weight: 219 lb 12.8 oz (99.7 kg)  Height: 5' 4 (1.626 m)

## 2024-03-21 DIAGNOSIS — Z86018 Personal history of other benign neoplasm: Secondary | ICD-10-CM | POA: Diagnosis not present

## 2024-03-21 DIAGNOSIS — K219 Gastro-esophageal reflux disease without esophagitis: Secondary | ICD-10-CM | POA: Diagnosis not present

## 2024-03-22 NOTE — Assessment & Plan Note (Signed)
 POC HgA1c done and more uncontrolled than prior. She would like to try jardiance . First month 10 mg daily then 25 mg daily for month 2 and beyond sent in today. We will also prescribe cgm so she can make dietary modifications as she is motivated to do so. Follow up 3 months and if no improvement she is willing to see endo.

## 2024-03-30 ENCOUNTER — Encounter: Payer: Self-pay | Admitting: Internal Medicine

## 2024-04-02 ENCOUNTER — Telehealth: Payer: Self-pay

## 2024-04-02 ENCOUNTER — Other Ambulatory Visit (HOSPITAL_COMMUNITY): Payer: Self-pay

## 2024-04-02 DIAGNOSIS — Z1231 Encounter for screening mammogram for malignant neoplasm of breast: Secondary | ICD-10-CM | POA: Diagnosis not present

## 2024-04-02 LAB — HM MAMMOGRAPHY

## 2024-04-02 NOTE — Telephone Encounter (Signed)
 Pharmacy Patient Advocate Encounter   Received notification from Patient Advice Request messages that prior authorization for [facility] 3 plus sensor is required/requested.   Insurance verification completed.   The patient is insured through [facility] Medicare.   Will have to call in to the plan to submit

## 2024-04-04 NOTE — Progress Notes (Unsigned)
 [name] [address] [place] [address] Phone: [phone] Subjective:   [name], am serving as a scribe for Dr. Arthea Claudene.  I'm seeing this patient by the request  of:  Rollene Almarie LABOR, MD  CC: Multiple joint complaints  YEP:Dlagzrupcz  01/06/2024 Stable at the moment as well.  Increase activity slowly.  No need for repeat injection at this time.  Do feel that if patient could get more weight loss she would feel better.  Having some difficulty with her diabetes as well discussed talking to primary care provider with the possibility of endocrinology if continuing to have difficulty.     Patient is doing much better at this time.  No need for any type of injection.  Discussed ergonomic changes including a vertical mouse that I think will be beneficial.  Follow-up again in 6 to 8 weeks      Update 04/05/2024 Margaret [name] is a 68 y.o. female coming in with complaint of L knee and R AC joint pain. Patient states knee was doing well, but flared it recently. Shoulder is doing better, but not perfect.      Past Medical History:  Diagnosis Date   Arthritis    Endometrial cancer (HCC)    Exercise-induced asthma    Heart murmur    History of bradycardia 2011   had an episode of HR 38 at time of blood work w/ palpitations and SOB,  work-up done by cardiology (dr dalton rolan) stress echo normal on 01/ 2012 and (12/ 2011) holter monitor with PACs with average HR 68/  07-14-2017 per pt no issues since then   Hx of colonic polyps    x3   IBS (irritable bowel syndrome)    OSA (obstructive sleep apnea) 07-14-2017 per had a new study on 07-12-2017 have not heard about results yet   per study 01-06-2005 moderate OSA (AHI 23.3/hr) used cpap, per pt lost weight withthe resolution of symptoms but has gain weight back   Type 2 diabetes mellitus (HCC)    Wears glasses    Past Surgical History:  Procedure Laterality Date    colonoscopy with polypectomy  2012 approx.   DOBUTAMINE  STRESS ECHO  04/2010   normal with no evidence of ischemia or infarction   LAPAROSCOPIC CHOLECYSTECTOMY  1996   PLANTAR FASCIA RELEASE     ROBOTIC ASSISTED SUPRACERVICAL HYSTERECTOMY WITH BILATERAL SALPINGO OOPHERECTOMY N/A 07/19/2017   Procedure: XI ROBOTIC ASSISTED  HYSTERECTOMY WITH BILATERAL SALPINGO OOPHORECTOMY; SENTINEL LYMPH NODE BIOPSY;  Surgeon: Eloy Herring, MD;  Location: [facility];  Service: Gynecology;  Laterality: N/A;   SENTINEL NODE BIOPSY N/A 07/19/2017   Procedure: SENTINEL NODE BIOPSY;  Surgeon: Eloy Herring, MD;  Location: [facility];  Service: Gynecology;  Laterality: N/A;   TRANSTHORACIC ECHOCARDIOGRAM  04/17/2010   ef 55%/  trivial AR and TR/ left LAE/     Social History   Socioeconomic History   Marital status: Divorced    Spouse name: Not on file   Number of children: Not on file   Years of education: Not on file   Highest education level: Bachelor's degree (e.g., BA, AB, BS)  Occupational History   Occupation: Sports coach    Employer: [employer]  Tobacco Use   Smoking status: Former    Types: Cigarettes   Smokeless tobacco: Never   Tobacco comments:    07-14-2017 per pt Smoked [age] ONLY as teen  Vaping Use  Vaping status: Never Used  Substance and Sexual Activity   Alcohol use: Yes    Comment: rarely   Drug use: No   Sexual activity: Not on file  Other Topics Concern   Not on file  Social History Narrative   Not on file   Social Drivers of Health   Tobacco Use: Medium Risk (03/20/2024)   Patient History    Smoking Tobacco Use: Former    Smokeless Tobacco Use: Never    Passive Exposure: Not on file  Financial Resource Strain: Low Risk (04/22/2023)   Overall Financial Resource Strain (CARDIA)    Difficulty of Paying Living Expenses: Not very hard  Food Insecurity: No Food Insecurity (04/22/2023)   Hunger Vital Sign    Worried About Running Out of Food in the Last Year: Never true     Ran Out of Food in the Last Year: Never true  Transportation Needs: No Transportation Needs (04/22/2023)   PRAPARE - Administrator, Civil Service (Medical): No    Lack of Transportation (Non-Medical): No  Physical Activity: Sufficiently Active (04/22/2023)   Exercise Vital Sign    Days of Exercise per Week: 5 days    Minutes of Exercise per Session: 120 min  Stress: No Stress Concern Present (04/22/2023)   [employer] of Occupational Health - Occupational Stress Questionnaire    Feeling of Stress : Only a little  Social Connections: Moderately Isolated (04/22/2023)   Social Connection and Isolation Panel    Frequency of Communication with Friends and Family: More than three times a week    Frequency of Social Gatherings with Friends and Family: More than three times a week    Attends Religious Services: 1 to 4 times per year    Active Member of [employer] or Organizations: No    Attends Engineer, Structural: Not on file    Marital Status: Divorced  Depression (PHQ2-9): Low Risk (03/20/2024)   Depression (PHQ2-9)    PHQ-2 Score: 0  Alcohol Screen: Not on file  Housing: Low Risk (04/22/2023)   Housing Stability Vital Sign    Unable to Pay for Housing in the Last Year: No    Number of Times Moved in the Last Year: 0    Homeless in the Last Year: No  Utilities: Not on file  Health Literacy: Not on file   Allergies[1] Family History  Problem Relation Age of Onset   Heart attack Mother 41   COPD Mother    Lung cancer Mother    Peripheral vascular disease Mother    Colon polyps Father    COPD Father    Diabetes Father        diet controlled   Hypertension Brother        2 bro   Stroke Neg Hx     Current Outpatient Medications (Endocrine & Metabolic):    empagliflozin  (JARDIANCE ) 10 MG TABS tablet, Take 1 tablet (10 mg total) by mouth daily.   empagliflozin  (JARDIANCE ) 25 MG TABS tablet, Take 1 tablet (25 mg total) by mouth daily.   glimepiride  (AMARYL ) 1 MG  tablet, TAKE 1 TABLET BY MOUTH DAILY WITH BREAKFAST.   metFORMIN  (GLUCOPHAGE -XR) 500 MG 24 hr tablet, TAKE 2 TABLETS BY MOUTH TWICE A DAY  Current Outpatient Medications (Cardiovascular):    EPINEPHrine  0.3 mg/0.3 mL IJ SOAJ injection, Inject 0.3 mg into the muscle as needed for anaphylaxis.   lisinopril  (ZESTRIL ) 2.5 MG tablet, TAKE 1 TABLET BY MOUTH EVERY DAY  pravastatin  (PRAVACHOL ) 40 MG tablet, TAKE 1 TABLET BY MOUTH EVERY DAY  Current Outpatient Medications (Analgesics):    celecoxib  (CELEBREX ) 100 MG capsule, TAKE 1 CAPSULE BY MOUTH TWICE A DAY  Current Outpatient Medications (Other):    Continuous Glucose Receiver (FREESTYLE LIBRE 3 READER) DEVI, Use to monitor sugars   Continuous Glucose Sensor (FREESTYLE LIBRE 3 PLUS SENSOR) MISC, Change sensor every 15 days.   Melatonin 5 MG CHEW, Chew 3 each by mouth as needed.   Omega-3 Fatty Acids (FISH OIL ADULT GUMMIES PO), Take by mouth daily.   Reviewed prior external information including notes and imaging from  primary care provider As well as notes that were available from care everywhere and other healthcare systems.  Past medical history, social, surgical and family history all reviewed in electronic medical record.  No pertanent information unless stated regarding to the chief complaint.   Review of Systems:  No headache, visual changes, nausea, vomiting, diarrhea, constipation, dizziness, abdominal pain, skin rash, fevers, chills, night sweats, weight loss, swollen lymph nodes, body aches, joint swelling, chest pain, shortness of breath, mood changes. POSITIVE muscle aches  Objective  Blood pressure 118/78, pulse (!) 102, height 5' 4 (1.626 m), weight 211 lb (95.7 kg), SpO2 95%.   General: No apparent distress alert and oriented x3 mood and affect normal, dressed appropriately.  HEENT: Pupils equal, extraocular movements intact  Respiratory: Patient's speak in full sentences and does not appear short of breath   Cardiovascular: No lower extremity edema, non tender, no erythema  Left knee does have trace effusion noted.  Lateral tracking of the patella noted. Right shoulder to still have some mild impingement but 5 out of 5 rotator cuff strength it appears.  This is actually relatively surprising.  After informed written and verbal consent, patient was seated on exam table. Left knee was prepped with alcohol swab and utilizing anterolateral approach, patient's left knee space was injected with 4:1  marcaine 0.5%: Kenalog  40mg /dL. Patient tolerated the procedure well without immediate complications.   Impression and Recommendations:    The above documentation has been reviewed and is accurate and complete Arthea CHRISTELLA Sharps, DO        [1]  Allergies Allergen Reactions   Amoxicillin Hives   Other Anaphylaxis    TREE NUTS   Penicillins Hives and Rash    Hives Has patient had a PCN reaction causing immediate rash, facial/tongue/throat swelling, SOB or lightheadedness with hypotension: No Has patient had a PCN reaction causing severe rash involving mucus membranes or skin necrosis: No Has patient had a PCN reaction that required hospitalization: No Has patient had a PCN reaction occurring within the last 10 years: No If all of the above answers are NO, then may proceed with Cephalosporin use.  Hives Has patient had a PCN reaction causing immediate rash, facial/tongue/throat swelling, SOB or lightheadedness with hypotension: No Has patient had a PCN reaction causing severe rash involving mucus membranes or skin necrosis: No Has patient had a PCN reaction that required hospitalization: No Has patient had a PCN reaction occurring within the last 10 years: No If all of the above answers are NO, then may proceed with Cephalosporin use. Hives Has patient had a PCN reaction causing immediate rash, facial/tongue/throat swelling, SOB or lightheadedness with hypotension: No Has patient had a PCN  reaction causing severe rash involving mucus membranes or skin necrosis: No Has patient had a PCN reaction that required hospitalization: No Has patient had a PCN reaction occurring within the  last 10 years: No If all of the above answers are NO, then may proceed with Cephalosporin use.   Macrodantin  [Nitrofurantoin Macrocrystal] Rash   Paroxetine Other (See Comments)    Headache , malaise

## 2024-04-05 ENCOUNTER — Ambulatory Visit: Admitting: Family Medicine

## 2024-04-05 ENCOUNTER — Ambulatory Visit: Payer: Self-pay | Admitting: Family Medicine

## 2024-04-05 VITALS — BP 118/78 | HR 102 | Ht 64.0 in | Wt 211.0 lb

## 2024-04-05 DIAGNOSIS — M222X2 Patellofemoral disorders, left knee: Secondary | ICD-10-CM | POA: Diagnosis not present

## 2024-04-05 LAB — COMPREHENSIVE METABOLIC PANEL WITH GFR
ALT: 34 U/L (ref 3–35)
AST: 36 U/L (ref 5–37)
Albumin: 4.2 g/dL (ref 3.5–5.2)
Alkaline Phosphatase: 62 U/L (ref 39–117)
BUN: 19 mg/dL (ref 6–23)
CO2: 24 meq/L (ref 19–32)
Calcium: 9 mg/dL (ref 8.4–10.5)
Chloride: 104 meq/L (ref 96–112)
Creatinine, Ser: 0.86 mg/dL (ref 0.40–1.20)
GFR: 70.44 mL/min (ref >60.00)
Glucose, Bld: 316 mg/dL — ABNORMAL HIGH (ref 70–99)
Potassium: 4 meq/L (ref 3.5–5.1)
Sodium: 139 meq/L (ref 135–145)
Total Bilirubin: 0.4 mg/dL (ref 0.2–1.2)
Total Protein: 6.7 g/dL (ref 6.0–8.3)

## 2024-04-05 LAB — SEDIMENTATION RATE: Sed Rate: 11 mm/h (ref 0–30)

## 2024-04-05 LAB — CBC WITH DIFFERENTIAL/PLATELET
Basophils Absolute: 0.1 K/uL (ref 0.0–0.1)
Basophils Relative: 1.7 % (ref 0.0–3.0)
Eosinophils Absolute: 0.3 K/uL (ref 0.0–0.7)
Eosinophils Relative: 5.1 % — ABNORMAL HIGH (ref 0.0–5.0)
HCT: 43.2 % (ref 36.0–46.0)
Hemoglobin: 14.6 g/dL (ref 12.0–15.0)
Lymphocytes Relative: 24.8 % (ref 12.0–46.0)
Lymphs Abs: 1.3 K/uL (ref 0.7–4.0)
MCHC: 33.8 g/dL (ref 30.0–36.0)
MCV: 82.4 fl (ref 78.0–100.0)
Monocytes Absolute: 0.3 K/uL (ref 0.1–1.0)
Monocytes Relative: 5.1 % (ref 3.0–12.0)
Neutro Abs: 3.4 K/uL (ref 1.4–7.7)
Neutrophils Relative %: 63.3 % (ref 43.0–77.0)
Platelets: 266 K/uL (ref 150.0–400.0)
RBC: 5.25 Mil/uL — ABNORMAL HIGH (ref 3.87–5.11)
RDW: 13.4 % (ref 11.5–15.5)
WBC: 5.4 K/uL (ref 4.0–10.5)

## 2024-04-05 LAB — IBC PANEL
Iron: 135 ug/dL (ref 42–145)
Saturation Ratios: 28.4 % (ref 20.0–50.0)
TIBC: 476 ug/dL — ABNORMAL HIGH (ref 250.0–450.0)
Transferrin: 340 mg/dL (ref 212.0–360.0)

## 2024-04-05 LAB — TSH: TSH: 1.65 u[IU]/mL (ref 0.35–5.50)

## 2024-04-05 LAB — FERRITIN: Ferritin: 20.6 ng/mL (ref 10.0–291.0)

## 2024-04-05 LAB — VITAMIN B12: Vitamin B-12: >1500 pg/mL — ABNORMAL HIGH (ref 211–911)

## 2024-04-05 LAB — VITAMIN D 25 HYDROXY (VIT D DEFICIENCY, FRACTURES): VITD: 24.51 ng/mL — ABNORMAL LOW (ref 30.00–100.00)

## 2024-04-05 LAB — LIPASE: Lipase: 46 U/L (ref 11.0–59.0)

## 2024-04-05 NOTE — Patient Instructions (Addendum)
 Injection in L knee Good to see you! Happy Holidays Labs today See you again in 3 months

## 2024-04-05 NOTE — Assessment & Plan Note (Signed)
 Injection given today and tolerated the procedure well, discussed icing regimen and home exercises, discussed which activities to do and which ones to avoid.  Increase activity slowly.  Discussed icing regimen.  Follow-up with me again in 6 to 12 weeks.

## 2024-04-05 NOTE — Assessment & Plan Note (Signed)
 Labs checked including thyroid  with patient was losing weight but now having more difficulty

## 2024-04-07 LAB — PTH, INTACT AND CALCIUM
Calcium: 9 mg/dL (ref 8.6–10.4)
PTH: 32 pg/mL (ref 16–77)

## 2024-04-08 ENCOUNTER — Other Ambulatory Visit: Payer: Self-pay | Admitting: Family Medicine

## 2024-04-10 ENCOUNTER — Other Ambulatory Visit (HOSPITAL_COMMUNITY): Payer: Self-pay

## 2024-04-10 ENCOUNTER — Telehealth: Payer: Self-pay

## 2024-04-10 NOTE — Telephone Encounter (Signed)
 Pharmacy Patient Advocate Encounter   Received notification from Patient Pharmacy that prior authorization for [facility] 3 reader is required/requested.   Insurance verification completed.   The patient is insured through [employer].   Per test claim: PA required; PA submitted to above mentioned insurance via Phone Key/confirmation #/EOC -- Status is pending   Phone# [phone]

## 2024-04-10 NOTE — Telephone Encounter (Signed)
 Per the insurance, the sensors are ok to fill until 04/18/24 and will need a prior auth for 2026.  Placed a call to [facility] and per the representative we will receive a determination within 72 hours.   Phone# [phone]

## 2024-04-10 NOTE — Telephone Encounter (Signed)
 Copied from CRM #[id number]. Topic: General - Other >> Apr 10, 2024  3:59 PM Aisha D wrote: Reason for CRM: Valli with [employer] is requesting to speak with Dr.Crawford or her nurse in regards to the PA for the [facility] 3 reader. Valli stated that they are needed additional clinical information before the can approved the PA. He stated that he would just fax over the needed information and would like for the forms to be faxed back as soon as possible.

## 2024-04-11 NOTE — Telephone Encounter (Signed)
 Received a fax from the insurance for additional information on the [facility] 3 Reader. I am unable to find supporting documentation. Please advise.

## 2024-04-20 ENCOUNTER — Other Ambulatory Visit: Payer: Self-pay | Admitting: Internal Medicine

## 2024-04-24 ENCOUNTER — Other Ambulatory Visit (HOSPITAL_COMMUNITY): Payer: Self-pay

## 2024-04-25 LAB — HM COLONOSCOPY

## 2024-05-02 NOTE — Telephone Encounter (Signed)
 Spoke with pt and advised. Pt stated that she will send them an email tomorrow.

## 2024-05-03 ENCOUNTER — Encounter: Payer: Self-pay | Admitting: Internal Medicine

## 2024-05-04 ENCOUNTER — Other Ambulatory Visit: Payer: Self-pay

## 2024-05-04 DIAGNOSIS — E118 Type 2 diabetes mellitus with unspecified complications: Secondary | ICD-10-CM

## 2024-05-04 MED ORDER — BLOOD GLUCOSE MONITORING SUPPL W/DEVICE KIT: PACK | 0 refills | Status: AC

## 2024-05-16 ENCOUNTER — Encounter: Admitting: Internal Medicine

## 2024-05-16 ENCOUNTER — Ambulatory Visit: Admitting: Internal Medicine

## 2024-05-16 ENCOUNTER — Encounter: Payer: Self-pay | Admitting: Internal Medicine

## 2024-05-16 VITALS — BP 130/70 | HR 74 | Temp 97.8°F | Ht 64.0 in | Wt 206.4 lb

## 2024-05-16 DIAGNOSIS — Z0000 Encounter for general adult medical examination without abnormal findings: Secondary | ICD-10-CM | POA: Diagnosis not present

## 2024-05-16 DIAGNOSIS — E785 Hyperlipidemia, unspecified: Secondary | ICD-10-CM

## 2024-05-16 DIAGNOSIS — Z7984 Long term (current) use of oral hypoglycemic drugs: Secondary | ICD-10-CM | POA: Diagnosis not present

## 2024-05-16 DIAGNOSIS — Z8542 Personal history of malignant neoplasm of other parts of uterus: Secondary | ICD-10-CM | POA: Diagnosis not present

## 2024-05-16 DIAGNOSIS — Z6835 Body mass index (BMI) 35.0-35.9, adult: Secondary | ICD-10-CM

## 2024-05-16 DIAGNOSIS — E118 Type 2 diabetes mellitus with unspecified complications: Secondary | ICD-10-CM

## 2024-05-16 DIAGNOSIS — E1169 Type 2 diabetes mellitus with other specified complication: Secondary | ICD-10-CM | POA: Diagnosis not present

## 2024-05-16 DIAGNOSIS — I10 Essential (primary) hypertension: Secondary | ICD-10-CM | POA: Diagnosis not present

## 2024-05-16 LAB — CBC
HCT: 47.5 % — ABNORMAL HIGH (ref 36.0–46.0)
Hemoglobin: 15.6 g/dL — ABNORMAL HIGH (ref 12.0–15.0)
MCHC: 32.7 g/dL (ref 30.0–36.0)
MCV: 83.4 fl (ref 78.0–100.0)
Platelets: 282 10*3/uL (ref 150.0–400.0)
RBC: 5.7 Mil/uL — ABNORMAL HIGH (ref 3.87–5.11)
RDW: 13.8 % (ref 11.5–15.5)
WBC: 7.9 10*3/uL (ref 4.0–10.5)

## 2024-05-16 LAB — COMPREHENSIVE METABOLIC PANEL WITH GFR
ALT: 31 U/L (ref 3–35)
AST: 30 U/L (ref 5–37)
Albumin: 4.3 g/dL (ref 3.5–5.2)
Alkaline Phosphatase: 59 U/L (ref 39–117)
BUN: 22 mg/dL (ref 6–23)
CO2: 26 meq/L (ref 19–32)
Calcium: 9.3 mg/dL (ref 8.4–10.5)
Chloride: 104 meq/L (ref 96–112)
Creatinine, Ser: 0.76 mg/dL (ref 0.40–1.20)
GFR: 81.64 mL/min
Glucose, Bld: 151 mg/dL — ABNORMAL HIGH (ref 70–99)
Potassium: 4 meq/L (ref 3.5–5.1)
Sodium: 138 meq/L (ref 135–145)
Total Bilirubin: 0.3 mg/dL (ref 0.2–1.2)
Total Protein: 7.1 g/dL (ref 6.0–8.3)

## 2024-05-16 LAB — LIPID PANEL
Cholesterol: 176 mg/dL (ref 28–200)
HDL: 66.5 mg/dL
LDL Cholesterol: 65 mg/dL (ref 10–99)
NonHDL: 109.59
Total CHOL/HDL Ratio: 3
Triglycerides: 224 mg/dL — ABNORMAL HIGH (ref 10.0–149.0)
VLDL: 44.8 mg/dL — ABNORMAL HIGH (ref 0.0–40.0)

## 2024-05-16 LAB — MICROALBUMIN / CREATININE URINE RATIO
Creatinine,U: 27.9 mg/dL
Microalb Creat Ratio: UNDETERMINED mg/g (ref 0.0–30.0)
Microalb, Ur: <0.7 mg/dL

## 2024-05-16 NOTE — Patient Instructions (Signed)
 We will check the labs today.

## 2024-05-16 NOTE — Progress Notes (Signed)
" ° °  Subjective:   Patient ID: Margaret [name], female    DOB: 1955-09-01, 69 y.o.   MRN: [id number]  The patient is here for physical. Pertinent topics discussed: Discussed the use of AI scribe software for clinical note transcription with the patient, who gave verbal consent to proceed.  History of Present Illness [name] is a 69 year old female with diabetes who presents for follow-up regarding her medication regimen and recent health changes.  She feels significantly better after switching to Jardiance , noting a weight loss of ten pounds. She experiences increased urination and the need to drink more water . She is unsure of her current Jardiance  dosage but believes it may be 25 mg.  She uses a diabetic sensor, which has fallen off twice, leading her to rely on finger pricks for blood sugar monitoring. She notes a discrepancy of about twenty points between the sensor and manual checks.   Her gastrointestinal symptoms have improved, including reduced stomach pain and diarrhea, since changing medications.   PMH, [facility], social history reviewed and updated  Review of Systems  Constitutional: Negative.   HENT: Negative.    Eyes: Negative.   Respiratory:  Negative for cough, chest tightness and shortness of breath.   Cardiovascular:  Negative for chest pain, palpitations and leg swelling.  Gastrointestinal:  Negative for abdominal distention, abdominal pain, constipation, diarrhea, nausea and vomiting.  Musculoskeletal: Negative.   Skin: Negative.   Neurological: Negative.   Psychiatric/Behavioral: Negative.      Objective:  Physical Exam Constitutional:      Appearance: She is well-developed.  HENT:     Head: Normocephalic and atraumatic.  Cardiovascular:     Rate and Rhythm: Normal rate and regular rhythm.  Pulmonary:     Effort: Pulmonary effort is normal. No respiratory distress.     Breath sounds: Normal breath sounds. No wheezing or rales.  Abdominal:     General:  Bowel sounds are normal. There is no distension.     Palpations: Abdomen is soft.     Tenderness: There is no abdominal tenderness.  Musculoskeletal:     Cervical back: Normal range of motion.  Skin:    General: Skin is warm and dry.  Neurological:     Mental Status: She is alert and oriented to person, place, and time.     Coordination: Coordination normal.     Vitals:   05/16/24 0919  BP: 130/70  Pulse: 74  Temp: 97.8 F (36.6 C)  TempSrc: Oral  SpO2: 98%  Weight: 206 lb 6.4 oz (93.6 kg)  Height: 5' 4 (1.626 m)    Assessment & Plan:   "

## 2024-05-18 ENCOUNTER — Other Ambulatory Visit: Payer: Self-pay | Admitting: Internal Medicine

## 2024-05-18 ENCOUNTER — Telehealth: Payer: Self-pay

## 2024-05-18 ENCOUNTER — Ambulatory Visit: Payer: Self-pay | Admitting: Internal Medicine

## 2024-05-18 ENCOUNTER — Other Ambulatory Visit: Payer: Self-pay

## 2024-05-18 MED ORDER — EMPAGLIFLOZIN 25 MG PO TABS: 25.0000 mg | ORAL_TABLET | Freq: Every day | ORAL | 11 refills | Status: AC

## 2024-05-18 NOTE — Assessment & Plan Note (Signed)
 BMI 35.4 and complicated by diabetes. Continue weight management with diet and exercise.

## 2024-05-18 NOTE — Assessment & Plan Note (Signed)
BP at goal on regimen. Checking CMP and adjust as needed.

## 2024-05-18 NOTE — Telephone Encounter (Signed)
 Copied from CRM #[id number]. Topic: Clinical - Medication Refill >> May 18, 2024 10:44 AM Adelita E wrote: Medication: empagliflozin  (JARDIANCE ) 25 MG TABS tablet *30 day supply is needed  Has the patient contacted their pharmacy? Yes, advised to reach out to PCP office. (Agent: If no, request that the patient contact the pharmacy for the refill. If patient does not wish to contact the pharmacy document the reason why and proceed with request.) (Agent: If yes, when and what did the pharmacy advise?)  This is the patient's preferred pharmacy:  [id number] - [place], [place] - [address] AT [facility] [address] [place] [address] Phone: [phone] Fax: [phone]   Is this the correct pharmacy for this prescription? Yes If no, delete pharmacy and type the correct one.   Has the prescription been filled recently? No  Is the patient out of the medication? No, 3 pills left.  Has the patient been seen for an appointment in the last year OR does the patient have an upcoming appointment? Yes  Can we respond through MyChart? Yes  Agent: Please be advised that Rx refills may take up to 3 business days. We ask that you follow-up with your pharmacy.

## 2024-05-18 NOTE — Progress Notes (Signed)
 Med reordered as 30 day supply with 11 refills

## 2024-05-18 NOTE — Assessment & Plan Note (Signed)
 Flu shot yearly. Pneumonia complete. Shingrix complete. Tetanus up to date. Colonoscopy up to date. Mammogram up to date, pap smear up to date and dexa up to date. Counseled about sun safety and mole surveillance. Counseled about the dangers of distracted driving. Given 10 year screening recommendations.

## 2024-05-18 NOTE — Assessment & Plan Note (Signed)
 Checking fructosamine, UACR, lipid, CMP and adjust regimen as needed. She is feeling improved off amaryl  and on jardiance  25 mg daily.

## 2024-05-18 NOTE — Telephone Encounter (Signed)
 Medication refill prescribed in separate encounter

## 2024-05-19 LAB — FRUCTOSAMINE: Fructosamine: 277 umol/L (ref 205–285)

## 2024-07-04 ENCOUNTER — Ambulatory Visit: Admitting: Family Medicine
